# Patient Record
Sex: Female | Born: 1945
Health system: Southern US, Community
[De-identification: ages and names within clinical notes are randomized; demographics above are authoritative.]

## PROBLEM LIST (undated history)

## (undated) DIAGNOSIS — E785 Hyperlipidemia, unspecified: Secondary | ICD-10-CM

## (undated) DIAGNOSIS — F32A Depression, unspecified: Secondary | ICD-10-CM

## (undated) DIAGNOSIS — M85851 Other specified disorders of bone density and structure, right thigh: Secondary | ICD-10-CM

## (undated) DIAGNOSIS — M85852 Other specified disorders of bone density and structure, left thigh: Secondary | ICD-10-CM

## (undated) DIAGNOSIS — E210 Primary hyperparathyroidism: Secondary | ICD-10-CM

## (undated) HISTORY — DX: Primary hyperparathyroidism: E21.0

## (undated) HISTORY — PX: APPENDECTOMY: SHX54

## (undated) HISTORY — DX: Hyperlipidemia, unspecified: E78.5

## (undated) HISTORY — PX: COLONOSCOPY: SHX174

## (undated) HISTORY — PX: ABDOMINAL HYSTERECTOMY: SHX81

## (undated) HISTORY — DX: Other specified disorders of bone density and structure, right thigh: M85.851

## (undated) HISTORY — DX: Depression, unspecified: F32.A

## (undated) HISTORY — DX: Other specified disorders of bone density and structure, left thigh: M85.852

---

## 2006-05-13 ENCOUNTER — Encounter: Payer: Self-pay | Admitting: Internal Medicine

## 2007-04-23 ENCOUNTER — Encounter: Payer: Self-pay | Admitting: Internal Medicine

## 2007-04-23 LAB — CONVERTED CEMR LAB: HDL: 76 mg/dL

## 2007-12-21 ENCOUNTER — Encounter: Payer: Self-pay | Admitting: Internal Medicine

## 2007-12-21 LAB — CONVERTED CEMR LAB
HDL: 82 mg/dL
LDL Cholesterol: 116 mg/dL
Triglyceride fasting, serum: 79 mg/dL

## 2008-03-15 ENCOUNTER — Encounter: Payer: Self-pay | Admitting: Internal Medicine

## 2008-03-15 LAB — CONVERTED CEMR LAB
CO2: 28 meq/L
Calcium: 10.1 mg/dL
Creatinine, Ser: 0.75 mg/dL
Glucose, Bld: 94 mg/dL
HCT: 39.2 %
MCV: 91.3 fL
RDW: 13.1 %
Sodium: 144 meq/L

## 2008-07-27 ENCOUNTER — Encounter: Payer: Self-pay | Admitting: Internal Medicine

## 2008-07-27 LAB — HM COLONOSCOPY

## 2009-06-07 ENCOUNTER — Encounter: Payer: Self-pay | Admitting: Internal Medicine

## 2009-06-07 LAB — CONVERTED CEMR LAB
Basophils Relative: 0.4 %
CO2: 29 meq/L
Calcium: 10.5 mg/dL
Chloride: 106 meq/L
Cholesterol: 223 mg/dL
Eosinophils Relative: 1.6 %
HDL: 82 mg/dL
LDL Cholesterol: 123 mg/dL
Lymphocytes, automated: 35.4 %
Neutrophils Relative %: 53.7 %
Potassium: 4.2 meq/L
RDW: 13.8 %
TSH: 2.07 microintl units/mL
WBC: 5 10*3/uL

## 2010-07-25 ENCOUNTER — Ambulatory Visit: Payer: Self-pay | Admitting: Internal Medicine

## 2010-07-25 ENCOUNTER — Encounter: Payer: Self-pay | Admitting: Internal Medicine

## 2010-07-25 ENCOUNTER — Telehealth: Payer: Self-pay | Admitting: Internal Medicine

## 2010-07-25 DIAGNOSIS — E785 Hyperlipidemia, unspecified: Secondary | ICD-10-CM

## 2010-07-25 DIAGNOSIS — M85851 Other specified disorders of bone density and structure, right thigh: Secondary | ICD-10-CM

## 2010-07-25 DIAGNOSIS — F329 Major depressive disorder, single episode, unspecified: Secondary | ICD-10-CM | POA: Insufficient documentation

## 2010-07-25 DIAGNOSIS — E782 Mixed hyperlipidemia: Secondary | ICD-10-CM | POA: Insufficient documentation

## 2010-07-25 DIAGNOSIS — F339 Major depressive disorder, recurrent, unspecified: Secondary | ICD-10-CM | POA: Insufficient documentation

## 2010-07-25 DIAGNOSIS — M85852 Other specified disorders of bone density and structure, left thigh: Secondary | ICD-10-CM

## 2010-07-25 DIAGNOSIS — M810 Age-related osteoporosis without current pathological fracture: Secondary | ICD-10-CM | POA: Insufficient documentation

## 2010-07-27 LAB — CONVERTED CEMR LAB
AST: 25 units/L (ref 0–37)
Alkaline Phosphatase: 61 units/L (ref 39–117)
BUN: 13 mg/dL (ref 6–23)
Basophils Absolute: 0 10*3/uL (ref 0.0–0.1)
Bilirubin Urine: NEGATIVE
Calcium: 10.7 mg/dL — ABNORMAL HIGH (ref 8.4–10.5)
Cholesterol: 233 mg/dL — ABNORMAL HIGH (ref 0–200)
Direct LDL: 142.8 mg/dL
GFR calc non Af Amer: 74.93 mL/min (ref >60)
Lymphocytes Relative: 33.1 % (ref 12.0–46.0)
Monocytes Relative: 7.6 % (ref 3.0–12.0)
Neutrophils Relative %: 57.6 % (ref 43.0–77.0)
Nitrite: NEGATIVE
Platelets: 233 10*3/uL (ref 150.0–400.0)
Potassium: 4.7 meq/L (ref 3.5–5.1)
RDW: 13.2 % (ref 11.5–14.6)
Sodium: 142 meq/L (ref 135–145)
Total Bilirubin: 0.5 mg/dL (ref 0.3–1.2)
Total CHOL/HDL Ratio: 3
Urobilinogen, UA: 0.2 (ref 0.0–1.0)
VLDL: 25 mg/dL (ref 0.0–40.0)
pH: 5.5 (ref 5.0–8.0)

## 2010-08-08 ENCOUNTER — Encounter: Payer: Self-pay | Admitting: Internal Medicine

## 2010-08-08 LAB — CONVERTED CEMR LAB
HCT: 40.3 %
Hgb A1c MFr Bld: 6.7 %
MCV: 93.3 fL
RDW: 13.9 %
TSH: 0.71 microintl units/mL

## 2010-08-08 LAB — HM MAMMOGRAPHY

## 2010-08-23 ENCOUNTER — Ambulatory Visit: Payer: Self-pay | Admitting: Internal Medicine

## 2010-08-23 ENCOUNTER — Telehealth: Payer: Self-pay | Admitting: Internal Medicine

## 2010-08-27 DIAGNOSIS — E210 Primary hyperparathyroidism: Secondary | ICD-10-CM

## 2010-08-27 LAB — CONVERTED CEMR LAB
Calcium, Total (PTH): 10.8 mg/dL — ABNORMAL HIGH (ref 8.4–10.5)
PTH: 119.3 pg/mL — ABNORMAL HIGH (ref 14.0–72.0)

## 2010-08-28 ENCOUNTER — Encounter: Payer: Self-pay | Admitting: Internal Medicine

## 2010-08-30 ENCOUNTER — Ambulatory Visit: Payer: Self-pay | Admitting: Endocrinology

## 2010-09-20 ENCOUNTER — Ambulatory Visit: Payer: Self-pay | Admitting: Internal Medicine

## 2011-01-01 NOTE — Progress Notes (Signed)
Summary: fax #  Phone Note Call from Patient Call back at Home Phone [phone]   Caller: Patient Summary of Call: Pt called to inform VAL of names and phone numbers of previous MDs in [place]. Dr Joelene Millin [phone] fax [phone]. Dr Ala Dach [phone] Initial call taken by: Margaret Pyle, CMA,  July 25, 2010 1:34 PM  Follow-up for Phone Call        Faxed medical release forms to both md's Follow-up by: Orlan Leavens RMA,  July 25, 2010 4:04 PM

## 2011-01-01 NOTE — Assessment & Plan Note (Signed)
Summary: 4 - 6 WK FU---STC   Vital Signs:  Patient profile:   65 year old female Height:      67 inches (170.18 cm) Weight:      126.0 pounds (57.27 kg) O2 Sat:      97 % on Room air Temp:     97.6 degrees F (36.44 degrees C) oral Pulse rate:   65 / minute BP sitting:   102 / 62  (left arm) Cuff size:   regular  Vitals Entered By: Orlan Leavens RMA (September 20, 2010 10:42 AM)  O2 Flow:  Room air CC: 6 week follow-up Is Patient Diabetic? No Pain Assessment Patient in pain? no      Comments Req form to be fill-out & fax to her work/ decline flu shot   Primary Care Provider:  Newt Lukes MD  CC:  6 week follow-up.  History of Present Illness:  here for followup  depression - hx same - reports compliance with ongoing medical treatment and no changes in medication dose or frequency. denies adverse side effects related to current therapy but increasing symptoms of fatigue, sadness and worthlessness changed zoloft to cymbalta tx 08/2010. on SSRI tx since 2004, - no energy since move to [place] 01/2010 and retirement - denies SI/HI - improved symptoms with cymbalta - feels much better  hypercalcemia - mild, noted last CPX labs - denies excess Ca intake - supplements or dietary - no muscle aches, abd symptoms, constipation or kidney stones - confirmed mild primary hyperparathyroidism 08/2010 and saw endo - no plans for surg tx at this time  dyslipidemia - reports compliance with ongoing medical treatment and no changes in medication dose or frequency. denies adverse side effects related to current therapy. no myalgias or GI problems - on statin tx >3 years  osteopenia - dexa 2010 - started on tx - several tried and dc's due oSE - on boniva until 03/2010 when stopped by dentist due to jaw bone problem with dental work - no bone pain or hx fx    Clinical Review Panels:  Lipid Management   Cholesterol:  233 (07/25/2010)   LDL (bad choesterol):  123 (06/07/2009)   HDL (good  cholesterol):  81.19 (07/25/2010)   Triglycerides:  92 (06/07/2009)   Current Medications (verified): 1)  Pravastatin Sodium 20 Mg Tabs (Pravastatin Sodium) .... Take 1 At Bedtime 2)  B50 .... Take 1 By Mouth Once Daily 3)  Biotin 10 Mg Tabs (Biotin) .... Take 1 By Mouth Once Daily 4)  Calcium-Vitamin D 1200mg  .... Take Once Daily 5)  Cymbalta 60 Mg Cpep (Duloxetine Hcl) .Marland Kitchen.. 1 By Mouth Once Daily  Allergies (verified): No Known Drug Allergies  Past History:  Past Medical History: Depression osteopenia dyslipidemia primary hyperparathyroidism, mild - dx 08/2010  MD roster: gyn endo - ellison  Review of Systems  The patient denies weight loss, chest pain, syncope, headaches, and abdominal pain.    Physical Exam  General:  fit, alert, well-developed, well-nourished, and cooperative to examination.    Lungs:  normal respiratory effort, no intercostal retractions or use of accessory muscles; normal breath sounds bilaterally - no crackles and no wheezes.    Heart:  normal rate, regular rhythm, no murmur, and no rub. BLE without edema. Psych:  Oriented X3, memory intact for recent and remote, normally interactive, good eye contact, not anxious appearing, not depressed appearing, and not agitated.      Impression & Recommendations:  Problem # 1:  DEPRESSION (ICD-311)  Her updated medication list for this problem includes:    Cymbalta 60 Mg Cpep (Duloxetine hcl) .Marland Kitchen... 1 by mouth once daily  long hx same - symptoms situational precipitated by move and retirement spring 2011 change zoloft to cymbalta 08/2010 - doing well - cont same discussed poss use of venlafax for cost savings but will remain on cymbalta for now since newly changed and doing well  Problem # 2:  DYSLIPIDEMIA (ICD-272.4)  Her updated medication list for this problem includes:    Pravastatin Sodium 20 Mg Tabs (Pravastatin sodium) .Marland Kitchen... Take 1 at bedtime on tx since 2008 mild inc total since 2010 on prava  per pt - but as HDL very good, i do not rec any change in dose or type of statin  Labs Reviewed: SGOT: 25 (07/25/2010)   SGPT: 13 (07/25/2010)   HDL:76.10 (07/25/2010), 82 (06/07/2009)  LDL:123 (06/07/2009), 116 (12/21/2007)  Chol:233 (07/25/2010), 223 (06/07/2009)  Trig:125.0 (07/25/2010), 92 (06/07/2009)  Problem # 3:  PRIMARY HYPERPARATHYROIDISM (ICD-252.01) mild s/p endo eval 08/23/10 - monitor only, no surg or med change (OV and recs reviewed) check Ca 2x/yr and pth annually -   Complete Medication List: 1)  Pravastatin Sodium 20 Mg Tabs (Pravastatin sodium) .... Take 1 at bedtime 2)  B50  .... Take 1 by mouth once daily 3)  Biotin 10 Mg Tabs (Biotin) .... Take 1 by mouth once daily 4)  Calcium-vitamin D 1200mg   .... Take once daily 5)  Cymbalta 60 Mg Cpep (Duloxetine hcl) .Marland Kitchen.. 1 by mouth once daily  Patient Instructions: 1)  it was good to see you today. 2)  will send cymbalta and pravastatin to [facility] as discussed - also coupon for 30 free pills to pick up at your local pharmacy 3)  Please schedule a follow-up appointment in 3-6 months to monitor calcium level and cholesterol as well as depression meds; call sooner if problems.  Prescriptions: CYMBALTA 60 MG CPEP (DULOXETINE HCL) 1 by mouth once daily  #90 x 3   Entered and Authorized by:   Newt Lukes MD   Signed by:   Newt Lukes MD on 09/20/2010   Method used:   Faxed to ...       [facility] (mail-order)             , [place]         Ph: [phone]       Fax: [phone]   RxID:   [id number] PRAVASTATIN SODIUM 20 MG TABS (PRAVASTATIN SODIUM) take 1 at bedtime  #90 x 3   Entered and Authorized by:   Newt Lukes MD   Signed by:   Newt Lukes MD on 09/20/2010   Method used:   Faxed to ...       [facility] (mail-order)             , [place]         Ph: [phone]       Fax: [phone]   RxID:   [id number]    Orders Added: 1)  Est. Patient Level IV [75643]

## 2011-01-01 NOTE — Assessment & Plan Note (Signed)
Summary: NEW ENDO CON/ PARATHYROID / NWS   Vital Signs:  Patient profile:   65 year old female Height:      67 inches (170.18 cm) Weight:      123.25 pounds (56.02 kg) BMI:     19.37 O2 Sat:      97 % on Room air Temp:     97.4 degrees F (36.33 degrees C) oral Pulse rate:   64 / minute BP sitting:   102 / 58  (left arm) Cuff size:   regular  Vitals Entered By: Brenton Grills MA (August 30, 2010 2:15 PM)  O2 Flow:  Room air CC: New Endo/Parathyroid/Dr. Leschber/aj   Primary Provider:  Newt Lukes MD  CC:  New Endo/Parathyroid/Dr. Leschber/aj.  History of Present Illness: pt was incidentally noted on routine labs last month to have hypercalcemia.   she also has h/o "osteopenia."  she has few years of moderate depression, but no abd pain.  no assoc bony fracture or urolithiasis.   Current Medications (verified): 1)  Pravastatin Sodium 20 Mg Tabs (Pravastatin Sodium) .... Take 1 At Bedtime 2)  B50 .... Take 1 By Mouth Once Daily 3)  Biotin 10 Mg Tabs (Biotin) .... Take 1 By Mouth Once Daily 4)  Calcium-Vitamin D 1200mg  .... Take Once Daily 5)  Cymbalta 30 Mg Cpep (Duloxetine Hcl) .Marland Kitchen.. 1 By Mouth Once Daily X 2 Weeks, Then Increase To 60mg  Once Daily (New Prescription) 6)  Cymbalta 60 Mg Cpep (Duloxetine Hcl) .Marland Kitchen.. 1 By Mouth Once Daily  Allergies (verified): No Known Drug Allergies  Past History:  Past Medical History: Last updated: 08/23/2010 Depression osteopenia dyslipidemia  MD roster: gyn  Family History: Reviewed history from 07/25/2010 and no changes required. Family History of Colon CA 1st degree relative (brother dx age 50y) no CAD, CVA, or DM hx known no parathyroid or calcium probs.  Social History: Reviewed history from 07/25/2010 and no changes required. 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  she has had menopausal sxs since tah/bso at age 56.  Physical Exam  General:  normal appearance.   Head:  head: no deformity eyes: no periorbital swelling, no proptosis external nose and ears are normal mouth: no lesion seen Neck:  Supple without thyroid enlargement or tenderness.  Chest Wall:  no kyphosis Lungs:  Clear to auscultation bilaterally. Normal respiratory effort.  Heart:  Regular rate and rhythm without murmurs or gallops noted. Normal S1,S2.   Abdomen:  abdomen is soft, nontender.  no hepatosplenomegaly.   not distended.  no hernia  Msk:  muscle bulk and strength are grossly normal.  no obvious joint swelling.  gait is normal and steady  Extremities:  no deformity Neurologic:  cn 2-12 grossly intact.   readily moves all 4's.   sensation is intact to touch on all 4's Skin:  normal texture and temp.  no rash.  not diaphoretic  Cervical Nodes:  No significant adenopathy.  Psych:  Alert and cooperative; normal mood and affect; normal attention span and concentration.   Additional Exam:  Parathyroid Hormone  [H]  119.3 pg/mL  14.0-72.0 Calcium              [H]  10.8 mg/dL        Impression & Recommendations:  Problem # 1:  PRIMARY HYPERPARATHYROIDISM (ICD-252.01) mild  Problem # 2:  menopause this can contribute also to #3  Problem # 3:  OSTEOPENIA (ICD-733.90) uncertain if this is related to #1  Other Orders: Consultation Level IV (16109)  Patient Instructions: 1)  the only effective treatment for this condition is surgery.  2)  surgery is recommended in several situations, none of which appear to be present on your case. 3)  you should have a blood calcuim checked 2x a year, and a parathyroid level checked one a year.  dr Felicity Coyer or i would be happy  to do this.  also, you should consider having it checked if you get sick (and dehydrated).

## 2011-01-01 NOTE — Progress Notes (Signed)
Summary: [facility] Screening form/StayWell  [facility] Screening form/StayWell   Imported By: Sherian Rein 09/25/2010 09:47:08  _____________________________________________________________________  External Attachment:    Type:   Image     Comment:   External Document

## 2011-01-01 NOTE — Assessment & Plan Note (Signed)
Summary: 2-6 WK FU---STC   Vital Signs:  Patient profile:   65 year old female Height:      67 inches (170.18 cm) Weight:      123 pounds (55.91 kg) O2 Sat:      96 % on Room air Temp:     97.1 degrees F (36.17 degrees C) oral Pulse rate:   64 / minute BP sitting:   80 / 52  (left arm) Cuff size:   regular  Vitals Entered By: Orlan Leavens RMA (August 23, 2010 8:37 AM)  O2 Flow:  Room air CC: 6 week follow-up Is Patient Diabetic? No Pain Assessment Patient in pain? no        Primary Care Provider:  Newt Lukes MD  CC:  6 week follow-up.  History of Present Illness: here for followup  depression - hx same - reports compliance with ongoing medical treatment and no changes in medication dose or frequency. denies adverse side effects related to current therapy but increasing symptoms of fatigue, sadness and worthlessness. on SSRI tx since 2004, current dose since 2008 - no energy since move to [place] 01/2010 and retirement - denies SI/HI  hypercalcemia - mild, noted last CPX labs - denies excess Ca intake - supplements or dietary - no muscle aches, abd symptoms, constipation or kidney stones - no hx same known  dyslipidemia - reports compliance with ongoing medical treatment and no changes in medication dose or frequency. denies adverse side effects related to current therapy. no myalgias or GI problems - on statin tx >3 years  osteopenia - dexa 2010 - started on tx - several tried and dc's due oSE - on boniva until 03/2010 when stopped by dentist due to jaw bone problem with dental work - no bone pain or hx fx    Clinical Review Panels:  CBC   WBC:  6.2 (07/25/2010)   RBC:  4.88 (07/25/2010)   Hgb:  15.1 (07/25/2010)   Hct:  44.7 (07/25/2010)   Platelets:  233.0 (07/25/2010)   MCV  91.6 (07/25/2010)   MCHC  33.8 (07/25/2010)   RDW  13.2 (07/25/2010)   PMN:  57.6 (07/25/2010)   Lymphs:  33.1 (07/25/2010)   Monos:  7.6 (07/25/2010)   Eosinophils:  1.2  (07/25/2010)   Basophil:  0.5 (07/25/2010)  Complete Metabolic Panel   Glucose:  87 (07/25/2010)   Sodium:  142 (07/25/2010)   Potassium:  4.7 (07/25/2010)   Chloride:  105 (07/25/2010)   CO2:  30 (07/25/2010)   BUN:  13 (07/25/2010)   Creatinine:  0.8 (07/25/2010)   Albumin:  4.5 (07/25/2010)   Total Protein:  7.2 (07/25/2010)   Calcium:  10.7 (07/25/2010)   Total Bili:  0.5 (07/25/2010)   Alk Phos:  61 (07/25/2010)   SGPT (ALT):  13 (07/25/2010)   SGOT (AST):  25 (07/25/2010)   Current Medications (verified): 1)  Zoloft 100 Mg Tabs (Sertraline Hcl) .... Take 1 1/2 By Mouth Once Daily 2)  Pravastatin Sodium 20 Mg Tabs (Pravastatin Sodium) .... Take 1 At Bedtime 3)  B50 .... Take 1 By Mouth Once Daily 4)  Biotin 10 Mg Tabs (Biotin) .... Take 1 By Mouth Once Daily 5)  Calcium-Vitamin D 1200mg  .... Take Once Daily  Allergies (verified): No Known Drug Allergies  Past History:  Past Medical History: Depression osteopenia dyslipidemia  MD roster: gyn  Review of Systems  The patient denies fever, weight loss, chest pain, syncope, and headaches.    Physical Exam  General:  fit, alert, well-developed, well-nourished, and cooperative to examination.    Lungs:  normal respiratory effort, no intercostal retractions or use of accessory muscles; normal breath sounds bilaterally - no crackles and no wheezes.    Heart:  normal rate, regular rhythm, no murmur, and no rub. BLE without edema. normal DP pulses and normal cap refill in all 4 extremities    Psych:  Oriented X3, memory intact for recent and remote, normally interactive,fair eye contact, not anxious appearing, mod depressed appearing with mild dysphoria, not agitated.      Impression & Recommendations:  Problem # 1:  DEPRESSION (ICD-311)  Her updated medication list for this problem includes:    Zoloft 100 Mg Tabs (Sertraline hcl) .Marland Kitchen... Take 1 once daily x 3 days then 1/2 by mouth once daily x 3 days, then stop     Cymbalta 30 Mg Cpep (Duloxetine hcl) .Marland Kitchen... 1 by mouth once daily x 2 weeks, then increase to 60mg  once daily (new prescription)    Cymbalta 60 Mg Cpep (Duloxetine hcl) .Marland Kitchen... 1 by mouth once daily  Orders: Prescription Created Electronically [id number])  long hx same - symptoms worse in past 6 mos - ?situational precipitated by move and retirement? prev tried alt depression med --- caused dizziness - sent for prior records to review but not here given cont "down feelings" , change now to alt med tx  risk/benefit of med tx reviewed and pt agrees to same close fu 4-6 weeks to review meds and symptoms   Problem # 2:  HYPERCALCEMIA (ICD-275.42) mild, incidental on CPX labs - check iPTH now - tx if abn - Orders: T-Parathyroid Hormone, Intact w/ Calcium ([id number])  Complete Medication List: 1)  Zoloft 100 Mg Tabs (Sertraline hcl) .... Take 1 once daily x 3 days then 1/2 by mouth once daily x 3 days, then stop 2)  Pravastatin Sodium 20 Mg Tabs (Pravastatin sodium) .... Take 1 at bedtime 3)  B50  .... Take 1 by mouth once daily 4)  Biotin 10 Mg Tabs (Biotin) .... Take 1 by mouth once daily 5)  Calcium-vitamin D 1200mg   .... Take once daily 6)  Cymbalta 30 Mg Cpep (Duloxetine hcl) .Marland Kitchen.. 1 by mouth once daily x 2 weeks, then increase to 60mg  once daily (new prescription) 7)  Cymbalta 60 Mg Cpep (Duloxetine hcl) .Marland Kitchen.. 1 by mouth once daily  Other Orders: Dermatology Referral (Derma)  Patient Instructions: 1)  it was good to see you today. 2)  test(s) ordered today - your results will be posted on the phone tree for review in 48-72 hours from the time of test completion; call [phone] and enter your 9 digit MRN (listed above on this page, just below your name); if any changes need to be made or there are abnormal results, you will be contacted directly. 3)  wean of sertraline and start cymbalta as discussed (see below) - your 60mg  cymbalta prescription has been electronically submitted to your  pharmacy. Please take as directed. Contact our office if you believe you're having problems with the medication(s).  4)  we'll make referral to dermatology.  Our office will contact you regarding this appointment once made.  5)  Please schedule a follow-up appointment in 4-6 weeks, sooner if problems.  Prescriptions: CYMBALTA 60 MG CPEP (DULOXETINE HCL) 1 by mouth once daily  #30 x 3   Entered and Authorized by:   Newt Lukes MD   Signed by:   Newt Lukes MD on 08/23/2010  Method used:   Electronically to        [facility]  [address] [phone]* (retail)       [address] [place], [place]  [address]       Ph: [phone] or [phone]       Fax: [phone]   RxID:   [id number]

## 2011-01-01 NOTE — Progress Notes (Signed)
Summary: Dosage?  Phone Note Call from Patient Call back at [facility] Phone [phone]   Caller: Patient Summary of Call: Pt called stating that she is unclear of how to wean off of Zoloft and start Cymbalta (dosage). Left message on machine for pt to return my call  Initial call taken by: Margaret Pyle, CMA,  August 23, 2010 2:41 PM  Follow-up for Phone Call        left message on machine for pt to return my call  left message on machine for pt to return my call  left message on machine for pt to return my call if she still needed help with], medicaltions. Closing phone note until further contact from pt. Follow-up by: Margaret Pyle, CMA,  August 28, 2010 8:12 AM

## 2011-01-01 NOTE — Assessment & Plan Note (Signed)
Summary: NEW PT/ [employer] #   Vital Signs:  Patient profile:   65 year old female Height:      67 inches (170.18 cm) Weight:      123.8 pounds (56.27 kg) BMI:     19.46 O2 Sat:      97 % on Room air Temp:     98.5 degrees F (36.94 degrees C) oral Pulse rate:   67 / minute BP sitting:   100 / 72  (left arm) Cuff size:   regular  Vitals Entered By: Orlan Leavens RMA (July 25, 2010 8:16 AM)  O2 Flow:  Room air CC: New patient Is Patient Diabetic? No Pain Assessment Patient in pain? no        Primary Care [place]:  Newt Lukes MD  CC:  New patient.  History of Present Illness: new pt to me and our practice, here to est care no local PCP  patient is here today for annual physical. Patient feels well and has no complaints.   also review chronic issues: 1) dyslipidemia - reports compliance with ongoing medical treatment and no changes in medication dose or frequency. denies adverse side effects related to current therapy. no myalgias or GI problems - on statin tx since  2) depression hx - reports compliance with ongoing medical treatment and no changes in medication dose or frequency. denies adverse side effects related to current therapy. on SSRI tx since 2004, current dose since 2008 - no energy since move to [place] 6 mo ago and retirement  3) osteopenia - dexa 2010 - started on tx - several tried and dc's due oSE - on boniva until 03/2010 when stopped by dentitis due to jaw bone problem with dental work - no bone pain or hx fx    Contraindications/Deferment of Procedures/Staging:    Test/Procedure: FLU VAX    Reason for deferment: patient declined   Preventive Screening-Counseling & Management  Alcohol-Tobacco     Alcohol drinks/day: <1     Alcohol Counseling: not indicated; use of alcohol is not excessive or problematic     Smoking Status: quit     Tobacco Counseling: not to resume use of tobacco products  Caffeine-Diet-Exercise     Does Patient Exercise:  yes     Depression Counseling: further diagnostic testing and/or other treatment is indicated  Safety-Violence-Falls     Seat Belt Counseling: not indicated; patient wears seat belts     Helmet Counseling: not indicated; patient wears helmet when riding bicycle/motocycle     Firearms in the Home: no firearms in the home     Firearm Counseling: not applicable     Violence Counseling: not indicated; no violence risk noted     Fall Risk Counseling: not indicated; no significant falls noted  Clinical Review Panels:  Prevention   Last Mammogram:  No specific mammographic evidence of malignancy.   (12/02/2008)   Last Colonoscopy:  Pt states was done in [place]. Results was normal, but since brother had colon cancer recommend repeat in 5 years (12/03/2007)   Current Medications (verified): 1)  Zoloft 100 Mg Tabs (Sertraline Hcl) .... Take 1 1/2 By Mouth Once Daily 2)  Pravastatin Sodium 20 Mg Tabs (Pravastatin Sodium) .... Take 1 At Bedtime 3)  B50 .... Take 1 By Mouth Once Daily 4)  Biotin 10 Mg Tabs (Biotin) .... Take 1 By Mouth Once Daily 5)  Calcium-Vitamin D 1200mg  .... 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 I have reviewed all other systems and they were negative.   Physical Exam  General:  fit, alert, well-developed, well-nourished, and cooperative to examination.    Head:  Normocephalic and atraumatic without obvious  abnormalities. No apparent alopecia or balding. Eyes:  vision grossly intact; pupils equal, round and reactive to light.  conjunctiva and lids normal.    Ears:  normal pinnae bilaterally, without erythema, swelling, or tenderness to palpation. scarring R TM, mild clear effusion B, no cerumen impaction. Hearing grossly normal bilaterally  Mouth:  teeth and gums in good repair - area of bone/gum loss front left medial to canine) ; mucous membranes moist, without lesions or ulcers. oropharynx clear without exudate, no erythema.  Neck:  supple, full ROM, no masses, no thyromegaly; no thyroid nodules or tenderness. no JVD or carotid bruits.   Lungs:  normal respiratory effort, no intercostal retractions or use of accessory muscles; normal breath sounds bilaterally - no crackles and no wheezes.    Heart:  normal rate, regular rhythm, no murmur, and no rub. BLE without edema. normal DP pulses and normal cap refill in all 4 extremities    Abdomen:  soft, non-tender, normal bowel sounds, no distention; no masses and no appreciable hepatomegaly or splenomegaly.   Genitalia:  defer to gyn Msk:  No deformity or scoliosis noted of thoracic or lumbar spine.   Neurologic:  alert & oriented X3 and cranial nerves II-XII symetrically intact.  strength normal in all extremities, sensation intact to light touch, and gait normal. speech fluent without dysarthria or aphasia; follows commands with good comprehension.  Skin:  no rashes, vesicles, ulcers, or erythema. No nodules or irregularity to palpation.  Psych:  Oriented X3, memory intact for recent and remote, normally interactive, good eye contact, not anxious appearing, not depressed appearing but mild dysphoria, not agitated.      Impression & Recommendations:  Problem # 1:  PREVENTIVE HEALTH CARE (ICD-V70.0) Patient has been counseled on age-appropriate routine health concerns for screening and prevention. These are reviewed and up-to-date. Immunizations are  up-to-date or declined. Labs ordered and ECG reviewed -WNL. refer to gyn and for mammo, dexa 2010 - ostepenia - see below  Orders: TLB-CBC Platelet - w/Differential (85025-CBCD) TLB-Hepatic/Liver Function Pnl (80076-HEPATIC) TLB-Lipid Panel (80061-LIPID) TLB-TSH (Thyroid Stimulating Hormone) (84443-TSH) EKG w/ Interpretation (93000) TLB-BMP (Basic Metabolic Panel-BMET) (80048-METABOL) TLB-Udip w/ Micro (81001-URINE) Gynecologic Referral (Gyn) Misc. Referral (Misc. Ref)  Problem # 2:  OSTEOPENIA (ICD-733.90) prev on tx - fosamax, boniva, evista and another.. (?name) following dexa 2010 results  (not available at this time but wil send for records from [place]) dentist stopped tx 03/2010 due to jaw bone loss (upper left mouth) takes vit d + ca supp - remain off bisphos - recheck dexa 2012 -- ?consider prolia if cont progression of skel bone loss - d/w pt today  Problem # 3:  DEPRESSION (ICD-311) long hx same - symptoms worse in past 6 mos - ?situation precipitated by move  and retirement? prev tried alt depression med --- caused dizziness - send for prior records to review f/u 2-6 weeks if cont "down feelings" to consider alt med tx after review of prior trials - pt agrees Her updated medication list for this problem includes:    Zoloft 100 Mg Tabs (Sertraline hcl) .Marland Kitchen... Take 1 1/2 by mouth once daily  Problem # 4:  DYSLIPIDEMIA (ICD-272.4) on tx since 2008 - checking labs with cpx today -  Her updated medication list for this problem includes:    Pravastatin Sodium 20 Mg Tabs (Pravastatin sodium) .Marland Kitchen... Take 1 at bedtime  Complete Medication List: 1)  Zoloft 100 Mg Tabs (Sertraline hcl) .... Take 1 1/2 by mouth once daily 2)  Pravastatin Sodium 20 Mg Tabs (Pravastatin sodium) .... Take 1 at bedtime 3)  B50  .... Take 1 by mouth once daily 4)  Biotin 10 Mg Tabs (Biotin) .... Take 1 by mouth once daily 5)  Calcium-vitamin D 1200mg   .... Take once daily  Other Orders: Tdap => 60yrs IM  (16109) Admin 1st Vaccine (60454)  Patient Instructions: 1)  it was good to see you today. 2)  medical history and medications reviewed - no changes today 3)  exam and EKG look great 4)  test(s) ordered today - your results will be posted on the phone tree for review in 48-72 hours from the time of test completion; call [phone] and enter your 9 digit MRN (listed above on this page, just below your name); if any changes need to be made or there are abnormal results, you will be contacted directly.  5)  will send for records from your prior [facility] to review 6)  we'll make referral to gynecology and for screening mammography. Our office will contact you regarding these appointments once made.  7)  Please schedule a follow-up appointment in 2-6 weeks to review depression symptoms and medications, sooner if problems.    Colonoscopy  Procedure date:  12/03/2007  Findings:      Pt states was done in [place]. Results was normal, but since brother had colon cancer recommend repeat in 5 years  Mammogram  Procedure date:  12/02/2008  Findings:      No specific mammographic evidence of malignancy.      Immunizations Administered:  Tetanus Vaccine:    Vaccine Type: Tdap    Site: right deltoid    Mfr: GlaxoSmithKline    Dose: 0.5 ml    Route: IM    Given by: Orlan Leavens RMA    Exp. Date: 05/31/2012    Lot #: [id number]    VIS given: 07/25/10

## 2011-01-01 NOTE — Procedures (Signed)
Summary: Colonoscopy/Stephens Odis Luster MD  Colonoscopy/Stephens Odis Luster MD   Imported By: Sherian Rein 08/01/2010 08:28:26  _____________________________________________________________________  External Attachment:    Type:   Image     Comment:   External Document

## 2011-01-11 ENCOUNTER — Encounter: Payer: Self-pay | Admitting: Internal Medicine

## 2011-01-11 ENCOUNTER — Ambulatory Visit (INDEPENDENT_AMBULATORY_CARE_PROVIDER_SITE_OTHER): Payer: BC Managed Care – PPO | Admitting: Internal Medicine

## 2011-01-11 DIAGNOSIS — H6500 Acute serous otitis media, unspecified ear: Secondary | ICD-10-CM

## 2011-01-17 NOTE — Assessment & Plan Note (Signed)
Summary: SORE THROAT--EARACHE  STC   Vital Signs:  Patient profile:   65 year old female Height:      67 inches (170.18 cm) O2 Sat:      96 % on Room air Temp:     98.5 degrees F (36.94 degrees C) oral Pulse rate:   70 / minute BP sitting:   110 / 64  (left arm) Cuff size:   regular  Vitals Entered By: Orlan Leavens RMA (January 11, 2011 11:41 AM)  O2 Flow:  Room air CC: sore throat & earache, URI symptoms Is Patient Diabetic? No Pain Assessment Patient in pain? yes     Location: ears   Primary Care Provider:  Newt Lukes MD  CC:  sore throat & earache and URI symptoms.  History of Present Illness:  URI Symptoms      This is a 65 year old woman who presents with URI symptoms.  The symptoms began 6 days ago.  The severity is described as moderate.  The patient reports sore throat, dry cough, earache, and sick contacts, but denies nasal congestion, clear nasal discharge, and purulent nasal discharge.  Associated symptoms include low-grade fever (<100.5 degrees), use of an antipyretic, and response to antipyretic.  The patient denies stiff neck, dyspnea, wheezing, rash, vomiting, and diarrhea.  The patient also reports headache, muscle aches, and severe fatigue.  The patient denies sneezing, seasonal symptoms, and response to antihistamine.  The patient denies the following risk factors for Strep sinusitis: double sickening, tender adenopathy, and absence of cough.    Current Medications (verified): 1)  Pravastatin Sodium 20 Mg Tabs (Pravastatin Sodium) .... Take 1 At Bedtime 2)  B50 .... Take 1 By Mouth Once Daily 3)  Biotin 10 Mg Tabs (Biotin) .... Take 1 By Mouth Once Daily 4)  Calcium-Vitamin D 1200mg  .... Take Once Daily 5)  Cymbalta 60 Mg Cpep (Duloxetine Hcl) .Marland Kitchen.. 1 By Mouth Once Daily  Allergies (verified): No Known Drug Allergies  Past History:  Past Medical History: Depression osteopenia   dyslipidemia  primary hyperparathyroidism, mild - dx 08/2010  MD  roster: gyn endo - [facility]  Review of Systems  The patient denies weight loss, hoarseness, syncope, prolonged cough, and hemoptysis.    Physical Exam  General:  alert, well-developed, well-nourished, and cooperative to examination.   audible nasal congestion Head:  Normocephalic and atraumatic without obvious abnormalities. No apparent alopecia or balding. Eyes:  vision grossly intact; pupils equal, round and reactive to light.  conjunctiva and lids normal.    Ears:  normal pinnae bilaterally, without erythema, swelling, or tenderness to palpation. scarring R TM, mild clear effusion L>R, no cerumen impaction. Hearing grossly normal bilaterally  Nose:  External nasal examination shows no deformity or inflammation. Nasal mucosa are pink and moist without lesions or exudates. Mouth:  teeth and gums in good repair; mucous membranes moist, without lesions or ulcers. oropharynx clear without exudate, no erythema.  Lungs:  normal respiratory effort, no intercostal retractions or use of accessory muscles; normal breath sounds bilaterally - no crackles and no wheezes.    Heart:  normal rate, regular rhythm, no murmur, and no rub. BLE without edema.   Impression & Recommendations:  Problem # 1:  OTITIS MEDIA, SEROUS, ACUTE (ICD-381.01)  tx pred pak and abx - erx done cont otc symptoms relief as ongoing for cough/congestion  Orders: Prescription Created Electronically [phone])  Complete Medication List: 1)  Pravastatin Sodium 20 Mg Tabs (Pravastatin sodium) .... Take 1 at  bedtime 2)  B50  .... Take 1 by mouth once daily 3)  Biotin 10 Mg Tabs (Biotin) .... Take 1 by mouth once daily 4)  Calcium-vitamin D 1200mg   .... Take once daily 5)  Cymbalta 60 Mg Cpep (Duloxetine hcl) .Marland Kitchen.. 1 by mouth once daily 6)  Biaxin 500 Mg Tabs (Clarithromycin) .Marland Kitchen.. 1 by mouth two times a day x 7 days 7)  Prednisone (pak) 10 Mg Tabs (Prednisone) .... As directed x 6 days  Patient Instructions: 1)  it was good to  see you today. 2)  Biaxin and Pred pak for sinus/ear symptoms as discussed - your prescriptions have been electronically submitted to your pharmacy. Please take as directed. Contact our office if you believe you're having problems with the medication(s).  3)  Get plenty of rest, drink lots of clear liquids, and use Tylenol or Ibuprofen for fever and comfort; also use other "cold" formulation meds for decongestion, cough etc.  Return in 7-10 days if you're not better:sooner if you're feeling worse. Prescriptions: PREDNISONE (PAK) 10 MG TABS (PREDNISONE) as directed x 6 days  #1 pak x 0   Entered and Authorized by:   Newt Lukes MD   Signed by:   Newt Lukes MD on 01/11/2011   Method used:   Electronically to        [facility]  [address] [phone]* (retail)       [address] [place], [place]  [address]       Ph: [phone] or [phone]       Fax: [phone]   RxID:   [id number] BIAXIN 500 MG TABS (CLARITHROMYCIN) 1 by mouth two times a day x 7 days  #14 x 0   Entered and Authorized by:   Newt Lukes MD   Signed by:   Newt Lukes MD on 01/11/2011   Method used:   Electronically to        [facility]  [address] [phone]* (retail)       [address] [place], [place]  [address]       Ph: [phone] or [phone]       Fax: [phone]   RxID:   [id number]    Orders Added: 1)  Est. Patient Level IV [23762] 2)  Prescription Created Electronically [phone]

## 2011-05-10 ENCOUNTER — Telehealth: Payer: Self-pay

## 2011-05-10 DIAGNOSIS — E210 Primary hyperparathyroidism: Secondary | ICD-10-CM

## 2011-05-10 NOTE — Telephone Encounter (Signed)
Pt called to have labs scheduled. Per OV with SAE 08/30/2010 pt is to have blood calcium checked every 6 months and parathyroid levels every year. Labs scheduled, pt advised.

## 2011-05-13 ENCOUNTER — Other Ambulatory Visit: Payer: BC Managed Care – PPO

## 2011-05-16 ENCOUNTER — Other Ambulatory Visit (INDEPENDENT_AMBULATORY_CARE_PROVIDER_SITE_OTHER): Payer: BC Managed Care – PPO

## 2011-05-16 DIAGNOSIS — E210 Primary hyperparathyroidism: Secondary | ICD-10-CM

## 2011-05-17 LAB — PTH, INTACT AND CALCIUM
Calcium, Total (PTH): 11.5 mg/dL — ABNORMAL HIGH (ref 8.4–10.5)
PTH: 54.9 pg/mL (ref 14.0–72.0)

## 2011-06-01 ENCOUNTER — Encounter: Payer: Self-pay | Admitting: Internal Medicine

## 2011-09-02 ENCOUNTER — Other Ambulatory Visit: Payer: Self-pay | Admitting: Internal Medicine

## 2011-10-16 ENCOUNTER — Telehealth: Payer: Self-pay | Admitting: *Deleted

## 2011-10-16 DIAGNOSIS — E210 Primary hyperparathyroidism: Secondary | ICD-10-CM

## 2011-10-16 DIAGNOSIS — Z0000 Encounter for general adult medical examination without abnormal findings: Secondary | ICD-10-CM

## 2011-10-16 NOTE — Telephone Encounter (Signed)
CPX labs and parathyroid level + calcium entered into computer as future order labs -thanks

## 2011-10-16 NOTE — Telephone Encounter (Signed)
Pt states made 6 months f/u appt. Requesting labs done prior to appt...10/16/11@2 :20pm/LMB

## 2011-10-17 NOTE — Telephone Encounter (Signed)
Called pt was told by husband pt wasn't available left msg labs has been entered & she is able to come on at her convience...10/17/11@9 :14am/LMB

## 2011-10-18 ENCOUNTER — Encounter: Payer: Self-pay | Admitting: Internal Medicine

## 2011-10-18 ENCOUNTER — Ambulatory Visit (INDEPENDENT_AMBULATORY_CARE_PROVIDER_SITE_OTHER): Payer: BC Managed Care – PPO | Admitting: *Deleted

## 2011-10-18 ENCOUNTER — Other Ambulatory Visit (INDEPENDENT_AMBULATORY_CARE_PROVIDER_SITE_OTHER): Payer: BC Managed Care – PPO

## 2011-10-18 DIAGNOSIS — Z0000 Encounter for general adult medical examination without abnormal findings: Secondary | ICD-10-CM

## 2011-10-18 DIAGNOSIS — Z23 Encounter for immunization: Secondary | ICD-10-CM

## 2011-10-18 DIAGNOSIS — E210 Primary hyperparathyroidism: Secondary | ICD-10-CM

## 2011-10-18 LAB — BASIC METABOLIC PANEL
BUN: 19 mg/dL (ref 6–23)
Calcium: 10.4 mg/dL (ref 8.4–10.5)
Creatinine, Ser: 0.9 mg/dL (ref 0.4–1.2)
GFR: 70.64 mL/min (ref >60.00)
Glucose, Bld: 82 mg/dL (ref 70–99)
Potassium: 4.5 mEq/L (ref 3.5–5.1)

## 2011-10-18 LAB — URINALYSIS
Bilirubin Urine: NEGATIVE
Leukocytes, UA: NEGATIVE
Nitrite: NEGATIVE
Total Protein, Urine: NEGATIVE
pH: 6 (ref 5.0–8.0)

## 2011-10-18 LAB — CBC WITH DIFFERENTIAL/PLATELET
Basophils Absolute: 0 10*3/uL (ref 0.0–0.1)
Eosinophils Relative: 1 % (ref 0.0–5.0)
Lymphocytes Relative: 29.6 % (ref 12.0–46.0)
Monocytes Relative: 6.4 % (ref 3.0–12.0)
Neutrophils Relative %: 62.5 % (ref 43.0–77.0)
Platelets: 235 10*3/uL (ref 150.0–400.0)
RDW: 12.5 % (ref 11.5–14.6)
WBC: 5.3 10*3/uL (ref 4.5–10.5)

## 2011-10-18 LAB — LIPID PANEL
Cholesterol: 191 mg/dL (ref 0–200)
VLDL: 10.6 mg/dL (ref 0.0–40.0)

## 2011-10-18 LAB — HEPATIC FUNCTION PANEL
ALT: 11 U/L (ref 0–35)
AST: 25 U/L (ref 0–37)
Bilirubin, Direct: 0 mg/dL (ref 0.0–0.3)
Total Protein: 7.4 g/dL (ref 6.0–8.3)

## 2011-10-18 LAB — TSH: TSH: 1.14 u[IU]/mL (ref 0.35–5.50)

## 2011-10-21 ENCOUNTER — Encounter: Payer: Self-pay | Admitting: Internal Medicine

## 2011-10-21 ENCOUNTER — Ambulatory Visit (INDEPENDENT_AMBULATORY_CARE_PROVIDER_SITE_OTHER): Payer: BC Managed Care – PPO | Admitting: Internal Medicine

## 2011-10-21 VITALS — BP 110/62 | HR 83 | Temp 98.8°F | Wt 126.4 lb

## 2011-10-21 DIAGNOSIS — F329 Major depressive disorder, single episode, unspecified: Secondary | ICD-10-CM

## 2011-10-21 DIAGNOSIS — E210 Primary hyperparathyroidism: Secondary | ICD-10-CM

## 2011-10-21 DIAGNOSIS — Z0000 Encounter for general adult medical examination without abnormal findings: Secondary | ICD-10-CM

## 2011-10-21 DIAGNOSIS — E785 Hyperlipidemia, unspecified: Secondary | ICD-10-CM

## 2011-10-21 LAB — PTH, INTACT AND CALCIUM: Calcium, Total (PTH): 10.7 mg/dL — ABNORMAL HIGH (ref 8.4–10.5)

## 2011-10-21 NOTE — Assessment & Plan Note (Signed)
hx same - changed zoloft to cymbalta tx 08/2010. on SSRI tx since 2004  Large component of SAD (winter, dark cold months)  improved overall on cymbalta -declines need for counseling - suggested light box therapy Pt to call if symptoms worse or unimproved

## 2011-10-21 NOTE — Patient Instructions (Signed)
It was good to see you today. We have reviewed your prior records including labs and tests today Health Maintenance reviewed - will plan DEXA 2013, otherwise up to date! Medications reviewed, no changes at this time. Please schedule followup in 6 months for calcium and PTH lab check only, 12 months for office visit; call sooner if problems.

## 2011-10-21 NOTE — Assessment & Plan Note (Signed)
On prava since 2008 -  The current medical regimen is effective;  continue present plan and medications.  

## 2011-10-21 NOTE — Progress Notes (Signed)
Subjective:    Patient ID: Courtney Hood, female    DOB: Dec 30, 1945, 65 y.o.   MRN: [id number]  HPI  patient is here today for annual physical. Patient feels well and has no complaints.  Also reviewed chronic medical issues: depression - hx same - reports compliance with ongoing medical treatment and no changes in medication dose or frequency. denies adverse side effects related to current therapy - changed zoloft to cymbalta 08/2010. on SSRI tx since 2004, seasonally worse (winter) denies SI/HI - declines need for counseling  hypercalcemia - mild, noted 2010 CPX labs - denies excess Ca intake - supplements or dietary - no muscle aches, constipation or kidney stones - confirmed mild primary hyperparathyroidism 08/2010 and saw endo - no plans for surg tx at this time   dyslipidemia - on prava - reports compliance with ongoing medical treatment and no changes in medication dose or frequency. denies adverse side effects related to current therapy. no myalgias or GI problems   osteopenia - dexa 2010 - started on tx - several tried and dc's due side effects -  on boniva until 03/2010 when stopped by dentist due to jaw bone problem with dental work -  no bone pain or hx fx  Past Medical History  Diagnosis Date  . DEPRESSION   . DERMATITIS   . DYSLIPIDEMIA   . OSTEOPENIA   . Primary hyperparathyroidism    Family History  Problem Relation Age of Onset  . Colon cancer Brother    History  Substance Use Topics  . Smoking status: Former Smoker    Quit date: 12/06/1999  . Smokeless tobacco: Not on file   Comment: Married, lives wih spouse. Moved to [place] area from [place] in spring 2011  . Alcohol Use: Yes     social    Review of Systems Constitutional: Negative for fever or weight change.  Respiratory: Negative for cough and shortness of breath.   Cardiovascular: Negative for chest pain or palpitations.  Gastrointestinal: Negative for abdominal pain, no bowel changes.    Musculoskeletal: Negative for gait problem or joint swelling.  Skin: Negative for rash.  Neurological: Negative for dizziness or headache.  No other specific complaints in a complete review of systems (except as listed in HPI above).     Objective:   Physical Exam BP 110/62  Pulse 83  Temp(Src) 98.8 F (37.1 C) (Oral)  Wt 126 lb 6.4 oz (57.335 kg)  SpO2 97% Wt Readings from Last 3 Encounters:  10/21/11 126 lb 6.4 oz (57.335 kg)  09/20/10 126 lb (57.153 kg)  08/30/10 123 lb 4 oz (55.906 kg)   Constitutional: She is thin, fit; appears well-developed and well-nourished. No distress.  HENT: Head: Normocephalic and atraumatic. Ears: B TMs ok, no erythema or effusion; Nose: Nose normal.  Mouth/Throat: Oropharynx is clear and moist. No oropharyngeal exudate.  Eyes: Conjunctivae and EOM are normal. Pupils are equal, round, and reactive to light. No scleral icterus.  Neck: Normal range of motion. Neck supple. No JVD present. No thyromegaly present.  Cardiovascular: Normal rate, regular rhythm and normal heart sounds.  No murmur heard. No BLE edema. Pulmonary/Chest: Effort normal and breath sounds normal. No respiratory distress. She has no wheezes.  Abdominal: Soft. Bowel sounds are normal. She exhibits no distension. There is no tenderness. no masses Musculoskeletal: Normal range of motion, no joint effusions. No gross deformities Neurological: She is alert and oriented to person, place, and time. No cranial nerve deficit. Coordination normal.  Skin: Skin is  warm and dry. No rash noted. No erythema.  Psychiatric: She has a normal mood and affect. Her behavior is normal. Judgment and thought content normal.   Lab Results  Component Value Date   WBC 5.3 10/18/2011   HGB 15.0 10/18/2011   HCT 44.8 10/18/2011   PLT 235.0 10/18/2011   GLUCOSE 82 10/18/2011   CHOL 191 10/18/2011   TRIG 53.0 10/18/2011   HDL 82.10 10/18/2011   LDLDIRECT 142.8 07/25/2010   LDLCALC 98 10/18/2011   ALT 11  10/18/2011   AST 25 10/18/2011   NA 142 10/18/2011   K 4.5 10/18/2011   CL 105 10/18/2011   CREATININE 0.9 10/18/2011   BUN 19 10/18/2011   CO2 28 10/18/2011   TSH 1.14 10/18/2011   HGBA1C 6.7 08/08/2010       Assessment & Plan:  CPX - v70.0 - Patient has been counseled on age-appropriate routine health concerns for screening and prevention. These are reviewed and up-to-date. Immunizations are up-to-date or declined. Labs reviewed.  Also See problem list. Medications and labs reviewed today.

## 2011-10-21 NOTE — Assessment & Plan Note (Signed)
Endo eval 08/2010 for same - calcium levels and PTH stable asymptomatic - follow every 6-12 months

## 2011-11-13 ENCOUNTER — Telehealth: Payer: Self-pay

## 2011-11-13 NOTE — Telephone Encounter (Signed)
Pt called stating MD mentioned adding a "boaster" to her antidepressant at the last OV. Pt is interested in starting this "boaster", please advise.

## 2011-12-06 ENCOUNTER — Other Ambulatory Visit: Payer: Self-pay | Admitting: Internal Medicine

## 2012-04-15 ENCOUNTER — Telehealth: Payer: Self-pay | Admitting: Internal Medicine

## 2012-04-15 DIAGNOSIS — E210 Primary hyperparathyroidism: Secondary | ICD-10-CM

## 2012-04-15 NOTE — Telephone Encounter (Signed)
Ca and iPTH ordered in EMR - pls notify pt of same

## 2012-04-15 NOTE — Telephone Encounter (Signed)
PT WAS TOLD TO GO TO THE LAB THIS MONTH FOR 6 MONTH LABS.

## 2012-04-16 NOTE — Telephone Encounter (Signed)
Notified pt with md response... 04/16/12@10 :15am/LMB

## 2012-04-22 ENCOUNTER — Other Ambulatory Visit: Payer: BC Managed Care – PPO

## 2012-04-22 DIAGNOSIS — E210 Primary hyperparathyroidism: Secondary | ICD-10-CM

## 2012-04-24 LAB — PTH, INTACT AND CALCIUM: PTH: 102.5 pg/mL — ABNORMAL HIGH (ref 14.0–72.0)

## 2012-05-11 ENCOUNTER — Other Ambulatory Visit: Payer: Self-pay | Admitting: Internal Medicine

## 2012-05-11 MED ORDER — POLYMYXIN B-TRIMETHOPRIM 10000-0.1 UNIT/ML-% OP SOLN: 1.0000 [drp] | OPHTHALMIC | Status: AC

## 2012-05-11 NOTE — Telephone Encounter (Signed)
Caller: Courtney Hood/Patient; PCP: Rene Paci; CB#: [phone];  Call regarding Eye Redness, Swelling and Crusty; Swelling and redness left eye onset 05/10/12; lashes crusted shut on 05/10/12.  "Sticky yelow goo" in eye. Emergent sx ruled out. Home care for the interim and see provider in 24 hours.   Declined offer of appointment on 6/11/ 13.  Asks if provider will call in Rx to pharmacy.  Note to provider per Eye: Infection or Irritation protocol.

## 2012-05-11 NOTE — Telephone Encounter (Signed)
polytrim - please send to preferred pharmacy

## 2012-05-11 NOTE — Telephone Encounter (Signed)
Rx sent to pharmacy, pt informed.  

## 2012-05-23 ENCOUNTER — Other Ambulatory Visit: Payer: Self-pay | Admitting: Internal Medicine

## 2012-10-09 ENCOUNTER — Telehealth: Payer: Self-pay | Admitting: *Deleted

## 2012-10-09 DIAGNOSIS — Z0000 Encounter for general adult medical examination without abnormal findings: Secondary | ICD-10-CM

## 2012-10-09 DIAGNOSIS — E210 Primary hyperparathyroidism: Secondary | ICD-10-CM

## 2012-10-09 NOTE — Telephone Encounter (Signed)
Message copied by Deatra James on Fri Oct 09, 2012 11:03 AM ------      Message from: Livingston Diones      Created: Fri Oct 09, 2012 10:48 AM       Pt is having CPE appt in 10/21/12. Please order lab prior to this appt and pt also req if she can add an order to check calcium level. Please call pt once its done.

## 2012-10-09 NOTE — Telephone Encounter (Signed)
Received staff msg pt made cpx for 11/20. Entering cpx labs. Pt is also wanting PTH labs drawn also. Is this ok?...Raechel Chute

## 2012-10-12 NOTE — Telephone Encounter (Signed)
Yes - i entered future pth order

## 2012-10-12 NOTE — Telephone Encounter (Signed)
Called pt was not at home gave msg to husband labs has been entered...Raechel Chute

## 2012-10-14 ENCOUNTER — Other Ambulatory Visit (INDEPENDENT_AMBULATORY_CARE_PROVIDER_SITE_OTHER): Payer: BC Managed Care – PPO

## 2012-10-14 DIAGNOSIS — E210 Primary hyperparathyroidism: Secondary | ICD-10-CM

## 2012-10-14 DIAGNOSIS — Z0000 Encounter for general adult medical examination without abnormal findings: Secondary | ICD-10-CM

## 2012-10-14 LAB — URINALYSIS, ROUTINE W REFLEX MICROSCOPIC
Bilirubin Urine: NEGATIVE
Ketones, ur: NEGATIVE
Total Protein, Urine: NEGATIVE
Urine Glucose: NEGATIVE

## 2012-10-14 LAB — HEPATIC FUNCTION PANEL
ALT: 12 U/L (ref 0–35)
AST: 27 U/L (ref 0–37)
Alkaline Phosphatase: 55 U/L (ref 39–117)
Bilirubin, Direct: 0.1 mg/dL (ref 0.0–0.3)
Total Protein: 7.2 g/dL (ref 6.0–8.3)

## 2012-10-14 LAB — CBC WITH DIFFERENTIAL/PLATELET
Basophils Relative: 0.7 % (ref 0.0–3.0)
Eosinophils Relative: 1.6 % (ref 0.0–5.0)
MCV: 89 fl (ref 78.0–100.0)
Monocytes Absolute: 0.4 10*3/uL (ref 0.1–1.0)
Monocytes Relative: 7.3 % (ref 3.0–12.0)
Neutrophils Relative %: 57.3 % (ref 43.0–77.0)
RBC: 4.95 Mil/uL (ref 3.87–5.11)
WBC: 5.7 10*3/uL (ref 4.5–10.5)

## 2012-10-14 LAB — LIPID PANEL
HDL: 80 mg/dL (ref >39.00)
Triglycerides: 100 mg/dL (ref 0.0–149.0)
VLDL: 20 mg/dL (ref 0.0–40.0)

## 2012-10-14 LAB — BASIC METABOLIC PANEL
BUN: 13 mg/dL (ref 6–23)
Calcium: 10.6 mg/dL — ABNORMAL HIGH (ref 8.4–10.5)
GFR: 66.82 mL/min (ref >60.00)
Glucose, Bld: 70 mg/dL (ref 70–99)
Sodium: 141 mEq/L (ref 135–145)

## 2012-10-15 LAB — PTH, INTACT AND CALCIUM
Calcium, Total (PTH): 11.1 mg/dL — ABNORMAL HIGH (ref 8.4–10.5)
PTH: 108.5 pg/mL — ABNORMAL HIGH (ref 14.0–72.0)

## 2012-10-21 ENCOUNTER — Ambulatory Visit (INDEPENDENT_AMBULATORY_CARE_PROVIDER_SITE_OTHER): Payer: BC Managed Care – PPO | Admitting: Internal Medicine

## 2012-10-21 ENCOUNTER — Encounter: Payer: Self-pay | Admitting: Internal Medicine

## 2012-10-21 VITALS — BP 104/62 | HR 87 | Temp 97.9°F | Ht 67.0 in | Wt 115.0 lb

## 2012-10-21 DIAGNOSIS — M949 Disorder of cartilage, unspecified: Secondary | ICD-10-CM

## 2012-10-21 DIAGNOSIS — F329 Major depressive disorder, single episode, unspecified: Secondary | ICD-10-CM

## 2012-10-21 DIAGNOSIS — J019 Acute sinusitis, unspecified: Secondary | ICD-10-CM

## 2012-10-21 DIAGNOSIS — E785 Hyperlipidemia, unspecified: Secondary | ICD-10-CM

## 2012-10-21 DIAGNOSIS — Z0000 Encounter for general adult medical examination without abnormal findings: Secondary | ICD-10-CM

## 2012-10-21 MED ORDER — PROMETHAZINE-PHENYLEPHRINE 6.25-5 MG/5ML PO SYRP: 5.0000 mL | ORAL_SOLUTION | ORAL | Status: DC | PRN

## 2012-10-21 MED ORDER — AZITHROMYCIN 250 MG PO TABS: ORAL_TABLET | ORAL | Status: DC

## 2012-10-21 MED ORDER — VENLAFAXINE HCL ER 75 MG PO CP24: 75.0000 mg | ORAL_CAPSULE | Freq: Every day | ORAL | Status: DC

## 2012-10-21 NOTE — Patient Instructions (Signed)
It was good to see you today. We have reviewed your prior records including labs and tests today Health Maintenance reviewed - all recommended immunizations and age-appropriate screenings are up-to-date. Medications reviewed - try Calcium citrate or calcium carbonate (without Vit D added) Change cymbalta to generic Effexor Your prescription(s) have been submitted to your pharmacy. Please take as directed and contact our office if you believe you are having problem(s) with the medication(s). we'll make appointment for DEXA to look at bone density. Our office will contact you regarding appointment(s) once made. Your results will be released to MyChart (or called to you) after review, usually within 72hours after test completion. If any changes need to be made, you will be notified at that same time. Please schedule followup in 1 year, call sooner if problems.

## 2012-10-21 NOTE — Progress Notes (Signed)
Subjective:    Patient ID: Courtney Hood, female    DOB: [birth year], 66 y.o.   MRN: [id number]  HPI  patient is here today for annual physical. Patient feels well overall.  Also reviewed chronic medical issues: depression - hx same - reports compliance with ongoing medical treatment and no changes in medication dose or frequency. denies adverse side effects related to current therapy - changed zoloft to cymbalta 08/2010. on SSRI tx since 2004, seasonally worse (winter) denies SI/HI - declines need for counseling  mild primary hyperparathyroidism 08/2010 - dx made after noting hypercalcemia, mild in 2010 CPX labs - denies excess Ca intake - no muscle aches, constipation or kidney stones - s/p 2011 endo eval - no plans for surg tx    dyslipidemia - on prava - reports compliance with ongoing medical treatment and no changes in medication dose or frequency. denies adverse side effects related to current therapy. no myalgias or GI problems   osteopenia - dexa 2010 - started on tx - several tried and dc's due side effects -  on boniva until 03/2010 when stopped by dentist due to jaw bone problem with dental work -  no bone pain or hx fx  Also complains of sinus congestion and green yellow dc >7 days. Not improved with OTC meds  Past Medical History  Diagnosis Date  . DEPRESSION   . DERMATITIS   . DYSLIPIDEMIA   . OSTEOPENIA   . Primary hyperparathyroidism    Family History  Problem Relation Age of Onset  . Colon cancer Brother    History  Substance Use Topics  . Smoking status: Former Smoker    Quit date: 12/06/1999  . Smokeless tobacco: Not on file     Comment: Married, lives wih spouse. Moved to [place] area from [place] in spring 2011  . Alcohol Use: Yes     Comment: social    Review of Systems  Constitutional: Negative for fever or weight change.  Respiratory: Negative for cough and shortness of breath.   Cardiovascular: Negative for chest pain or palpitations.    Gastrointestinal: Negative for abdominal pain, no bowel changes.  Musculoskeletal: Negative for gait problem or joint swelling.  Skin: Negative for rash.  Neurological: Negative for dizziness or headache.  No other specific complaints in a complete review of systems (except as listed in HPI above).     Objective:   Physical Exam  BP 104/62  Pulse 87  Temp 97.9 F (36.6 C) (Oral)  Ht 5\' 7"  (1.702 m)  Wt 115 lb (52.164 kg)  BMI 18.01 kg/m2  SpO2 97% Wt Readings from Last 3 Encounters:  10/21/12 115 lb (52.164 kg)  10/21/11 126 lb 6.4 oz (57.335 kg)  09/20/10 126 lb (57.153 kg)   Constitutional: She is thin, fit; appears well-developed and well-nourished. No distress.  HENT: Head: Normocephalic and atraumatic - mild sinus tenderness to palp above and below eyes. Ears: B TMs with mild erythema and hazy effusion; Mouth/Throat: Oropharynx is clear and moist. PND evident. No oropharyngeal exudate.  Eyes: Conjunctivae and EOM are normal. Pupils are equal, round, and reactive to light. No scleral icterus.  Neck: Normal range of motion. Neck supple. Mild LAD. No JVD present. No thyromegaly present.  Cardiovascular: Normal rate, regular rhythm and normal heart sounds.  No murmur heard. No BLE edema. Pulmonary/Chest: Effort normal and breath sounds normal. No respiratory distress. She has no wheezes.  Abdominal: Soft. Bowel sounds are normal. She exhibits no distension. There is no tenderness.  no masses Musculoskeletal: Normal range of motion, no joint effusions. No gross deformities Neurological: She is alert and oriented to person, place, and time. No cranial nerve deficit. Coordination normal.  Skin: Skin is warm and dry. No rash noted. No erythema.  Psychiatric: She has a normal mood and affect. Her behavior is normal. Judgment and thought content normal.   Lab Results  Component Value Date   WBC 5.7 10/14/2012   HGB 14.8 10/14/2012   HCT 44.0 10/14/2012   PLT 242.0 10/14/2012    GLUCOSE 70 10/14/2012   CHOL 214* 10/14/2012   TRIG 100.0 10/14/2012   HDL 80.00 10/14/2012   LDLDIRECT 108.0 10/14/2012   LDLCALC 98 10/18/2011   ALT 12 10/14/2012   AST 27 10/14/2012   NA 141 10/14/2012   K 3.8 10/14/2012   CL 104 10/14/2012   CREATININE 0.9 10/14/2012   BUN 13 10/14/2012   CO2 30 10/14/2012   TSH 1.40 10/14/2012   HGBA1C 6.7 08/08/2010   ECG: sinus @ 75bpm, no ischemic changes -    Assessment & Plan:  CPX - v70.0 - Patient has been counseled on age-appropriate routine health concerns for screening and prevention. These are reviewed and up-to-date. Immunizations are up-to-date or declined. Labs reviewed.  Acute sinusitis - Zpak and decongest syrup  Also See problem list. Medications and labs reviewed today.

## 2012-10-21 NOTE — Assessment & Plan Note (Signed)
Intol of bisphos due to osteonecrosis problems with jaw - dental problems persist Check DEXA now and continue Ca + Vit D as tolerated

## 2012-10-21 NOTE — Assessment & Plan Note (Signed)
hx same - changed zoloft to cymbalta tx 08/2010. on SSRI tx since 2004  Large component of SAD (winter, dark cold months)  improved overall on cymbalta, but cost issues Change to generic effexor now -declines need for counseling - suggested light box therapy Pt to call if symptoms worse or unimproved

## 2012-10-21 NOTE — Assessment & Plan Note (Signed)
On prava since 2008 -  The current medical regimen is effective;  continue present plan and medications.  

## 2012-10-27 ENCOUNTER — Telehealth: Payer: Self-pay | Admitting: Internal Medicine

## 2012-10-27 NOTE — Telephone Encounter (Signed)
Patient Information:  Caller Name: Gabriel Rung  Phone: [phone]  Patient: Courtney Hood, Courtney Hood  Gender: Female  DOB: December 20, 1945  Age: 66 Years  PCP: Rene Paci (Adults only)   Symptoms  Reason For Call & Symptoms: cold symptoms  Reviewed Health History In EMR: Yes  Reviewed Medications In EMR: Yes  Reviewed Allergies In EMR: Yes  Date of Onset of Symptoms: 10/17/2012  Treatments Tried: has finished azithromycin but has not seen any improvement  Treatments Tried Worked: No  Guideline(s) Used:  Cough  Earache  Disposition Per Guideline:   See Today in Office  Reason For Disposition Reached:   All other earaches (Exceptions: earache lasting < 1 hour, and earache from air travel)  Advice Given:  N/A  Office Follow Up:  Does the office need to follow up with this patient?: Yes  Instructions For The Office: Patient requests "a different antibiotic."  krs/can  Patient Refused Recommendation:  Patient Requests Prescription  Patient requests "a different antibiotic." krs/can  RN Note:  Has had swollen glands which are tender to touch.  Afebrile.  Per protocol, emergent symptoms denied; advised and offered appt within 24 hours.  Patient refuses; wants "different antibiotic" called in.   Uses [facility]. May reach patient at [phone].

## 2012-10-28 ENCOUNTER — Telehealth: Payer: Self-pay | Admitting: Internal Medicine

## 2012-10-28 MED ORDER — AMOXICILLIN-POT CLAVULANATE 875-125 MG PO TABS: 1.0000 | ORAL_TABLET | Freq: Two times a day (BID) | ORAL | Status: DC

## 2012-10-28 NOTE — Telephone Encounter (Signed)
Patient Information:  Caller Name: Courtney Hood  Phone: [phone]  Patient: Courtney Hood, Courtney Hood  Gender: Female  DOB: [birth year]  Age: 66 Years  PCP: Rene Paci (Adults only)   Symptoms  Reason For Call & Symptoms: continues to have a horrible head cold, completed medication given for same, asking for another antibiotic  Reviewed Health History In EMR: Yes  Reviewed Medications In EMR: Yes  Reviewed Allergies In EMR: Yes  Reviewed Surgeries / Procedures: Yes  Date of Onset of Symptoms: 10/21/2012  Treatments Tried: Zpac  Treatments Tried Worked: Yes  Guideline(s) Used:  Colds  Disposition Per Guideline:   Home Care  Reason For Disposition Reached:   Colds with no complications  Advice Given:  N/A  Office Follow Up:  Does the office need to follow up with this patient?: Yes  Instructions For The Office: medication request  RN Note:  Patient is feeling some better.  Asking for additional antibiotics to be called in.  Teaching done on the continued effect of the Zpac for 2 weeks after her last dose which was on 11/24.  No fever.  Has a productive cough but the sputum is clear.  Has pressure in her ears but no pain.    She stopped taking the antihistamin/decongestant that she was taking on 11/25.

## 2012-10-28 NOTE — Telephone Encounter (Signed)
See prior phone note initiated 11/26 - Augmentin erx done earlier today

## 2012-10-28 NOTE — Telephone Encounter (Signed)
augmentin bid x 7 d- erx done

## 2012-10-28 NOTE — Telephone Encounter (Signed)
No answer, no VM

## 2012-10-28 NOTE — Telephone Encounter (Signed)
Pt advised of Rx/pharmacy 

## 2012-10-30 ENCOUNTER — Other Ambulatory Visit: Payer: Self-pay | Admitting: Internal Medicine

## 2012-11-04 ENCOUNTER — Ambulatory Visit (INDEPENDENT_AMBULATORY_CARE_PROVIDER_SITE_OTHER)
Admission: RE | Admit: 2012-11-04 | Discharge: 2012-11-04 | Disposition: A | Discharge status: Home / Self Care | Payer: BC Managed Care – PPO | Source: Ambulatory Visit

## 2012-11-04 DIAGNOSIS — M899 Disorder of bone, unspecified: Secondary | ICD-10-CM

## 2012-11-05 LAB — HM MAMMOGRAPHY: HM Mammogram: NEGATIVE

## 2012-11-09 ENCOUNTER — Encounter: Payer: Self-pay | Admitting: Internal Medicine

## 2013-01-20 ENCOUNTER — Other Ambulatory Visit: Payer: Self-pay | Admitting: Internal Medicine

## 2013-03-17 ENCOUNTER — Other Ambulatory Visit: Payer: Self-pay

## 2013-03-17 MED ORDER — PRAVASTATIN SODIUM 20 MG PO TABS: ORAL_TABLET | ORAL | Status: DC

## 2013-04-28 ENCOUNTER — Telehealth: Payer: Self-pay | Admitting: Internal Medicine

## 2013-04-28 DIAGNOSIS — E210 Primary hyperparathyroidism: Secondary | ICD-10-CM

## 2013-04-28 NOTE — Telephone Encounter (Signed)
Order entered. thanks

## 2013-04-28 NOTE — Telephone Encounter (Signed)
Pt  wants to know if she can have an order for blood work to check her Calcium levels she states that  She has this done every 6 mos . please call pt when order is placed.

## 2013-04-29 NOTE — Telephone Encounter (Signed)
Called pt spoke with husband gave md response.../lmb 

## 2013-06-17 ENCOUNTER — Other Ambulatory Visit (INDEPENDENT_AMBULATORY_CARE_PROVIDER_SITE_OTHER): Payer: BC Managed Care – PPO

## 2013-06-17 DIAGNOSIS — E210 Primary hyperparathyroidism: Secondary | ICD-10-CM

## 2013-06-17 LAB — BASIC METABOLIC PANEL
Chloride: 105 mEq/L (ref 96–112)
Creatinine, Ser: 0.9 mg/dL (ref 0.4–1.2)
Sodium: 140 mEq/L (ref 135–145)

## 2013-07-28 ENCOUNTER — Other Ambulatory Visit: Payer: Self-pay | Admitting: *Deleted

## 2013-07-28 MED ORDER — VENLAFAXINE HCL ER 75 MG PO CP24: ORAL_CAPSULE | ORAL | Status: DC

## 2013-10-11 ENCOUNTER — Telehealth: Payer: Self-pay | Admitting: Internal Medicine

## 2013-10-11 DIAGNOSIS — Z0000 Encounter for general adult medical examination without abnormal findings: Secondary | ICD-10-CM

## 2013-10-11 DIAGNOSIS — E785 Hyperlipidemia, unspecified: Secondary | ICD-10-CM

## 2013-10-11 DIAGNOSIS — E210 Primary hyperparathyroidism: Secondary | ICD-10-CM

## 2013-10-11 NOTE — Telephone Encounter (Signed)
Labs entered as requested -  but please also let pt know some insurances do not cover lab tests done prior to day of OV thanks

## 2013-10-11 NOTE — Telephone Encounter (Signed)
10/11/2013  Pt is requesting to have lab orders put in for 6 month lab check for thyroid.  Please advise.

## 2013-10-11 NOTE — Telephone Encounter (Signed)
Notified pt with md response.../lmb 

## 2013-10-13 ENCOUNTER — Ambulatory Visit (INDEPENDENT_AMBULATORY_CARE_PROVIDER_SITE_OTHER): Payer: Medicare Other

## 2013-10-13 DIAGNOSIS — Z23 Encounter for immunization: Secondary | ICD-10-CM

## 2013-11-01 ENCOUNTER — Other Ambulatory Visit: Payer: Self-pay | Admitting: Internal Medicine

## 2013-11-08 LAB — HM MAMMOGRAPHY

## 2013-11-09 ENCOUNTER — Encounter: Payer: Self-pay | Admitting: Internal Medicine

## 2013-12-13 ENCOUNTER — Encounter: Payer: Medicare Other | Admitting: Internal Medicine

## 2013-12-13 DIAGNOSIS — Z0289 Encounter for other administrative examinations: Secondary | ICD-10-CM

## 2014-01-31 ENCOUNTER — Encounter: Payer: Medicare Other | Admitting: Internal Medicine

## 2014-02-07 ENCOUNTER — Encounter: Payer: Self-pay | Admitting: Internal Medicine

## 2014-02-07 ENCOUNTER — Ambulatory Visit (INDEPENDENT_AMBULATORY_CARE_PROVIDER_SITE_OTHER): Payer: Commercial Managed Care - HMO | Admitting: Internal Medicine

## 2014-02-07 VITALS — BP 108/60 | HR 70 | Temp 98.3°F | Ht 67.0 in | Wt 127.0 lb

## 2014-02-07 DIAGNOSIS — Z1211 Encounter for screening for malignant neoplasm of colon: Secondary | ICD-10-CM

## 2014-02-07 DIAGNOSIS — Z23 Encounter for immunization: Secondary | ICD-10-CM

## 2014-02-07 DIAGNOSIS — F3289 Other specified depressive episodes: Secondary | ICD-10-CM

## 2014-02-07 DIAGNOSIS — F329 Major depressive disorder, single episode, unspecified: Secondary | ICD-10-CM

## 2014-02-07 DIAGNOSIS — Z0000 Encounter for general adult medical examination without abnormal findings: Secondary | ICD-10-CM

## 2014-02-07 MED ORDER — VENLAFAXINE HCL ER 150 MG PO CP24: 150.0000 mg | ORAL_CAPSULE | Freq: Every day | ORAL | Status: DC

## 2014-02-07 MED ORDER — PRAVASTATIN SODIUM 20 MG PO TABS: 20.0000 mg | ORAL_TABLET | Freq: Every day | ORAL | Status: DC

## 2014-02-07 NOTE — Progress Notes (Signed)
Pre-visit discussion using our clinic review tool. No additional management support is needed unless otherwise documented below in the visit note.  

## 2014-02-07 NOTE — Patient Instructions (Addendum)
It was good to see you today.  We have reviewed your prior records including labs and tests today  Health Maintenance reviewed - pneumonia vaccine updated today -all other recommended immunizations and age-appropriate screenings are up-to-date.  we'll make referral to gastroenterology for screening colonoscopy. Our office will contact you regarding appointment(s) once made.  Test(s) ordered today. Return when you're fasting for this test. Your results will be released to MyChart (or called to you) after review, usually within 72hours after test completion. If any changes need to be made, you will be notified at that same time.  Medications reviewed and updated, increase venlafaxine to 150 mg daily -no other changes recommended at this time.  Please schedule followup in 12 months for annual exam and labs, call sooner if problems.  Health Maintenance, Female A healthy lifestyle and preventative care can promote health and wellness.  Maintain regular health, dental, and eye exams.  Eat a healthy diet. Foods like vegetables, fruits, whole grains, low-fat dairy products, and lean protein foods contain the nutrients you need without too many calories. Decrease your intake of foods high in solid fats, added sugars, and salt. Get information about a proper diet from your caregiver, if necessary.  Regular physical exercise is one of the most important things you can do for your health. Most adults should get at least 150 minutes of moderate-intensity exercise (any activity that increases your heart rate and causes you to sweat) each week. In addition, most adults need muscle-strengthening exercises on 2 or more days a week.   Maintain a healthy weight. The body mass index (BMI) is a screening tool to identify possible weight problems. It provides an estimate of body fat based on height and weight. Your caregiver can help determine your BMI, and can help you achieve or maintain a healthy weight. For  adults 20 years and older:  A BMI below 18.5 is considered underweight.  A BMI of 18.5 to 24.9 is normal.  A BMI of 25 to 29.9 is considered overweight.  A BMI of 30 and above is considered obese.  Maintain normal blood lipids and cholesterol by exercising and minimizing your intake of saturated fat. Eat a balanced diet with plenty of fruits and vegetables. Blood tests for lipids and cholesterol should begin at age 55 and be repeated every 5 years. If your lipid or cholesterol levels are high, you are over 50, or you are a high risk for heart disease, you may need your cholesterol levels checked more frequently.Ongoing high lipid and cholesterol levels should be treated with medicines if diet and exercise are not effective.  If you smoke, find out from your caregiver how to quit. If you do not use tobacco, do not start.  Lung cancer screening is recommended for adults aged 70 80 years who are at high risk for developing lung cancer because of a history of smoking. Yearly low-dose computed tomography (CT) is recommended for people who have at least a 30-pack-year history of smoking and are a current smoker or have quit within the past 15 years. A pack year of smoking is smoking an average of 1 pack of cigarettes a day for 1 year (for example: 1 pack a day for 30 years or 2 packs a day for 15 years). Yearly screening should continue until the smoker has stopped smoking for at least 15 years. Yearly screening should also be stopped for people who develop a health problem that would prevent them from having lung cancer treatment.  If you are pregnant, do not drink alcohol. If you are breastfeeding, be very cautious about drinking alcohol. If you are not pregnant and choose to drink alcohol, do not exceed 1 drink per day. One drink is considered to be 12 ounces (355 mL) of beer, 5 ounces (148 mL) of wine, or 1.5 ounces (44 mL) of liquor.  Avoid use of street drugs. Do not share needles with anyone. Ask  for help if you need support or instructions about stopping the use of drugs.  High blood pressure causes heart disease and increases the risk of stroke. Blood pressure should be checked at least every 1 to 2 years. Ongoing high blood pressure should be treated with medicines, if weight loss and exercise are not effective.  If you are 24 to 66 years old, ask your caregiver if you should take aspirin to prevent strokes.  Diabetes screening involves taking a blood sample to check your fasting blood sugar level. This should be done once every 3 years, after age 56, if you are within normal weight and without risk factors for diabetes. Testing should be considered at a younger age or be carried out more frequently if you are overweight and have at least 1 risk factor for diabetes.  Breast cancer screening is essential preventative care for women. You should practice "breast self-awareness." This means understanding the normal appearance and feel of your breasts and may include breast self-examination. Any changes detected, no matter how small, should be reported to a caregiver. Women in their 88s and 30s should have a clinical breast exam (CBE) by a caregiver as part of a regular health exam every 1 to 3 years. After age 55, women should have a CBE every year. Starting at age 39, women should consider having a mammogram (breast X-ray) every year. Women who have a family history of breast cancer should talk to their caregiver about genetic screening. Women at a high risk of breast cancer should talk to their caregiver about having an MRI and a mammogram every year.  Breast cancer gene (BRCA)-related cancer risk assessment is recommended for women who have family members with BRCA-related cancers. BRCA-related cancers include breast, ovarian, tubal, and peritoneal cancers. Having family members with these cancers may be associated with an increased risk for harmful changes (mutations) in the breast cancer genes  BRCA1 and BRCA2. Results of the assessment will determine the need for genetic counseling and BRCA1 and BRCA2 testing.  The Pap test is a screening test for cervical cancer. Women should have a Pap test starting at age 81. Between ages 32 and 28, Pap tests should be repeated every 2 years. Beginning at age 5, you should have a Pap test every 3 years as long as the past 3 Pap tests have been normal. If you had a hysterectomy for a problem that was not cancer or a condition that could lead to cancer, then you no longer need Pap tests. If you are between ages 61 and 68, and you have had normal Pap tests going back 10 years, you no longer need Pap tests. If you have had past treatment for cervical cancer or a condition that could lead to cancer, you need Pap tests and screening for cancer for at least 20 years after your treatment. If Pap tests have been discontinued, risk factors (such as a new sexual partner) need to be reassessed to determine if screening should be resumed. Some women have medical problems that increase the chance of getting cervical  cancer. In these cases, your caregiver may recommend more frequent screening and Pap tests.  The human papillomavirus (HPV) test is an additional test that may be used for cervical cancer screening. The HPV test looks for the virus that can cause the cell changes on the cervix. The cells collected during the Pap test can be tested for HPV. The HPV test could be used to screen women aged 63 years and older, and should be used in women of any age who have unclear Pap test results. After the age of 38, women should have HPV testing at the same frequency as a Pap test.  Colorectal cancer can be detected and often prevented. Most routine colorectal cancer screening begins at the age of 37 and continues through age 74. However, your caregiver may recommend screening at an earlier age if you have risk factors for colon cancer. On a yearly basis, your caregiver may  provide home test kits to check for hidden blood in the stool. Use of a small camera at the end of a tube, to directly examine the colon (sigmoidoscopy or colonoscopy), can detect the earliest forms of colorectal cancer. Talk to your caregiver about this at age 78, when routine screening begins. Direct examination of the colon should be repeated every 5 to 10 years through age 61, unless early forms of pre-cancerous polyps or small growths are found.  Hepatitis C blood testing is recommended for all people born from [birth year] through 1963 and any individual with known risks for hepatitis C.  Practice safe sex. Use condoms and avoid high-risk sexual practices to reduce the spread of sexually transmitted infections (STIs). Sexually active women aged 5 and younger should be checked for Chlamydia, which is a common sexually transmitted infection. Older women with new or multiple partners should also be tested for Chlamydia. Testing for other STIs is recommended if you are sexually active and at increased risk.  Osteoporosis is a disease in which the bones lose minerals and strength with aging. This can result in serious bone fractures. The risk of osteoporosis can be identified using a bone density scan. Women ages 35 and over and women at risk for fractures or osteoporosis should discuss screening with their caregivers. Ask your caregiver whether you should be taking a calcium supplement or vitamin D to reduce the rate of osteoporosis.  Menopause can be associated with physical symptoms and risks. Hormone replacement therapy is available to decrease symptoms and risks. You should talk to your caregiver about whether hormone replacement therapy is right for you.  Use sunscreen. Apply sunscreen liberally and repeatedly throughout the day. You should seek shade when your shadow is shorter than you. Protect yourself by wearing long sleeves, pants, a wide-brimmed hat, and sunglasses year round, whenever you are  outdoors.  Notify your caregiver of new moles or changes in moles, especially if there is a change in shape or color. Also notify your caregiver if a mole is larger than the size of a pencil eraser.  Stay current with your immunizations. Document Released: 06/03/2011 Document Revised: 03/15/2013 Document Reviewed: 06/03/2011 [facility] Patient Information 2014 [place].

## 2014-02-07 NOTE — Progress Notes (Signed)
Subjective:    Patient ID: Courtney Hood, female    DOB: [birth year], 68 y.o.   MRN: [id number]  HPI  Patient here today for annual follow-up.     Here for medicare wellness  Diet: heart healthy or DM if diabetic Physical activity: sedentary Depression/mood screen: negative Hearing: intact to whispered voice Visual acuity: grossly normal, performs annual eye exam  ADLs: capable Fall risk: none Home safety: good Cognitive evaluation: intact to orientation, naming, recall and repetition EOL planning: adv directives, full code/ I agree  I have personally reviewed and have noted 1. The patient's medical and social history 2. Their use of alcohol, tobacco or illicit drugs 3. Their current medications and supplements 4. The patient's functional ability including ADL's, fall risks, home safety risks and hearing or visual impairment. 5. Diet and physical activities 6. Evidence for depression or mood disorders   Chronic medical issues also reviewed.  depression - hx same - reports compliance with ongoing medical treatment and no changes in medication dose or frequency. denies adverse side effects related to current therapy - changed zoloft to cymbalta 08/2010. on SSRI tx since 2004, seasonally worse (winter) denies SI/HI - declines need for counseling.  States that sx have been worse over winter months.  She is more tearful and sleeping an increased amount lately.   mild primary hyperparathyroidism 08/2010 - dx made after noting hypercalcemia, mild in 2010 CPX labs - denies excess Ca intake - no muscle aches, constipation or kidney stones - s/p 2011 endo eval - no plans for surg tx   dyslipidemia - on prava - reports compliance with ongoing medical treatment and no changes in medication dose or frequency. denies adverse side effects related to current therapy. no myalgias or GI problems   osteopenia - dexa 2010 - started on tx - several tried and dc's due side effects -  on boniva  until 03/2010 when stopped by dentist due to jaw bone problem with dental work -  no bone pain or hx fx   Past Medical History  Diagnosis Date  . DEPRESSION   . DERMATITIS   . DYSLIPIDEMIA   . OSTEOPENIA   . Primary hyperparathyroidism    Family History  Problem Relation Age of Onset  . Colon cancer Brother    History   Social History  . Marital Status: Married    Spouse Name: N/A    Number of Children: N/A  . Years of Education: N/A   Occupational History  . Not on file.   Social History Main Topics  . Smoking status: Former Smoker    Quit date: 12/06/1999  . Smokeless tobacco: Not on file     Comment: Married, lives wih spouse. Moved to [place] area from [place] in spring 2011  . Alcohol Use: Yes     Comment: social  . Drug Use: No  . Sexual Activity:    Other Topics Concern  . Not on file   Social History Narrative  . No narrative on file     Review of Systems  Constitutional: Negative for fever, chills, activity change and appetite change.  HENT: Negative for congestion, ear pain, postnasal drip, rhinorrhea, sinus pressure and sore throat.   Eyes: Negative for visual disturbance.  Respiratory: Negative for cough, chest tightness, shortness of breath and wheezing.   Cardiovascular: Negative for chest pain, palpitations and leg swelling.  Gastrointestinal: Negative for nausea, vomiting, diarrhea, constipation and abdominal distention.  Endocrine: Negative for cold intolerance and heat intolerance.  Musculoskeletal: Negative for arthralgias.  Skin: Negative for rash and wound.       Bilateral great toe fungal infections  Neurological: Negative for dizziness, seizures, syncope, weakness and headaches.  Psychiatric/Behavioral: Positive for sleep disturbance (excessive sleepiness) and dysphoric mood. Negative for suicidal ideas and self-injury.  All other systems reviewed and are negative.       Objective:   Physical Exam  Vitals reviewed. Constitutional:  She is oriented to person, place, and time. She appears well-developed and well-nourished. No distress.  HENT:  Head: Normocephalic and atraumatic.  Right Ear: External ear normal.  Left Ear: External ear normal.  Nose: Nose normal.  Mouth/Throat: Oropharynx is clear and moist. No oropharyngeal exudate.  Eyes: Conjunctivae are normal. Pupils are equal, round, and reactive to light. Right eye exhibits no discharge. Left eye exhibits no discharge. No scleral icterus.  Neck: Normal range of motion. Neck supple. No thyromegaly present.  Cardiovascular: Normal rate, regular rhythm and normal heart sounds.   No murmur heard. Pulmonary/Chest: Effort normal and breath sounds normal. No respiratory distress. She has no wheezes.  Abdominal: Soft. Bowel sounds are normal. She exhibits no distension and no mass. There is no guarding.  Musculoskeletal: Normal range of motion. She exhibits no edema and no tenderness.  Lymphadenopathy:    She has no cervical adenopathy.  Neurological: She is alert and oriented to person, place, and time.  Skin: Skin is warm and dry. No rash noted. She is not diaphoretic.  Psychiatric: She has a normal mood and affect. Her behavior is normal. Judgment and thought content normal.     Wt Readings from Last 3 Encounters:  02/07/14 127 lb (57.607 kg)  10/21/12 115 lb (52.164 kg)  10/21/11 126 lb 6.4 oz (57.335 kg)   BP Readings from Last 3 Encounters:  02/07/14 108/60  10/21/12 104/62  10/21/11 110/62   Lab Results  Component Value Date   WBC 5.7 10/14/2012   HGB 14.8 10/14/2012   HCT 44.0 10/14/2012   PLT 242.0 10/14/2012   GLUCOSE 91 06/17/2013   CHOL 214* 10/14/2012   TRIG 100.0 10/14/2012   HDL 80.00 10/14/2012   LDLDIRECT 108.0 10/14/2012   LDLCALC 98 10/18/2011   ALT 12 10/14/2012   AST 27 10/14/2012   NA 140 06/17/2013   K 4.6 06/17/2013   CL 105 06/17/2013   CREATININE 0.9 06/17/2013   BUN 20 06/17/2013   CO2 29 06/17/2013   TSH 1.40 10/14/2012    HGBA1C 6.7 08/08/2010       Assessment & Plan:   CPx/v700 - Patient has been counseled on age-appropriate routine health concerns for screening and prevention. These are reviewed and up-to-date. Immunizations are up-to-date or declined. Labs ordered and reviewed.  Problem List Items Addressed This Visit   DEPRESSION     hx same - changed zoloft to cymbalta tx 08/2010. on SSRI tx since 2004  Large component of SAD (winter, dark cold months)  improved overall on cymbalta, but cost issues Changed to generic effexor 10/2012 - overall improved but recurrent sx Increase dose now declines need for counseling - suggested light box therapy Pt to call if symptoms worse or unimproved     Relevant Medications      venlafaxine (EFFEXOR-XR) 24 hr capsule    Other Visit Diagnoses   Routine general medical examination at a health care facility    -  Primary    Relevant Orders       Basic metabolic panel  CBC with Differential       Hepatic function panel       Lipid panel       TSH       Urinalysis, Routine w reflex microscopic    Special screening for malignant neoplasms, colon        Relevant Orders       Ambulatory referral to Gastroenterology

## 2014-02-07 NOTE — Assessment & Plan Note (Signed)
hx same - changed zoloft to cymbalta tx 08/2010. on SSRI tx since 2004  Large component of SAD (winter, dark cold months)  improved overall on cymbalta, but cost issues Changed to generic effexor 10/2012 - overall improved but recurrent sx Increase dose now declines need for counseling - suggested light box therapy Pt to call if symptoms worse or unimproved

## 2014-02-11 ENCOUNTER — Encounter: Payer: Self-pay | Admitting: Gastroenterology

## 2014-02-11 ENCOUNTER — Other Ambulatory Visit (INDEPENDENT_AMBULATORY_CARE_PROVIDER_SITE_OTHER): Payer: Commercial Managed Care - HMO

## 2014-02-11 DIAGNOSIS — Z0000 Encounter for general adult medical examination without abnormal findings: Secondary | ICD-10-CM

## 2014-02-11 LAB — TSH: TSH: 2.7 u[IU]/mL (ref 0.35–5.50)

## 2014-02-11 LAB — URINALYSIS, ROUTINE W REFLEX MICROSCOPIC
BILIRUBIN URINE: NEGATIVE
KETONES UR: NEGATIVE
Nitrite: NEGATIVE
Specific Gravity, Urine: 1.02 (ref 1.000–1.030)
TOTAL PROTEIN, URINE-UPE24: NEGATIVE
UROBILINOGEN UA: 0.2 (ref 0.0–1.0)
Urine Glucose: NEGATIVE
pH: 6 (ref 5.0–8.0)

## 2014-02-11 LAB — CBC WITH DIFFERENTIAL/PLATELET
BASOS PCT: 0.5 % (ref 0.0–3.0)
Basophils Absolute: 0 10*3/uL (ref 0.0–0.1)
EOS ABS: 0.1 10*3/uL (ref 0.0–0.7)
Eosinophils Relative: 1.3 % (ref 0.0–5.0)
HCT: 45.7 % (ref 36.0–46.0)
Hemoglobin: 15.2 g/dL — ABNORMAL HIGH (ref 12.0–15.0)
LYMPHS PCT: 37 % (ref 12.0–46.0)
Lymphs Abs: 2.2 10*3/uL (ref 0.7–4.0)
MCHC: 33.2 g/dL (ref 30.0–36.0)
MCV: 90.3 fl (ref 78.0–100.0)
Monocytes Absolute: 0.5 10*3/uL (ref 0.1–1.0)
Monocytes Relative: 7.6 % (ref 3.0–12.0)
NEUTROS PCT: 53.6 % (ref 43.0–77.0)
Neutro Abs: 3.2 10*3/uL (ref 1.4–7.7)
Platelets: 239 10*3/uL (ref 150.0–400.0)
RBC: 5.06 Mil/uL (ref 3.87–5.11)
RDW: 13.4 % (ref 11.5–14.6)
WBC: 6 10*3/uL (ref 4.5–10.5)

## 2014-02-11 LAB — LIPID PANEL
CHOL/HDL RATIO: 3
Cholesterol: 215 mg/dL — ABNORMAL HIGH (ref 0–200)
HDL: 82 mg/dL (ref >39.00)
LDL CALC: 118 mg/dL — AB (ref 0–99)
Triglycerides: 74 mg/dL (ref 0.0–149.0)
VLDL: 14.8 mg/dL (ref 0.0–40.0)

## 2014-02-11 LAB — BASIC METABOLIC PANEL
BUN: 16 mg/dL (ref 6–23)
CO2: 28 meq/L (ref 19–32)
CREATININE: 0.9 mg/dL (ref 0.4–1.2)
Calcium: 10.5 mg/dL (ref 8.4–10.5)
Chloride: 104 mEq/L (ref 96–112)
GFR: 71.09 mL/min (ref >60.00)
Glucose, Bld: 87 mg/dL (ref 70–99)
POTASSIUM: 4.6 meq/L (ref 3.5–5.1)
SODIUM: 140 meq/L (ref 135–145)

## 2014-02-11 LAB — HEPATIC FUNCTION PANEL
ALK PHOS: 61 U/L (ref 39–117)
ALT: 12 U/L (ref 0–35)
AST: 29 U/L (ref 0–37)
Albumin: 4.3 g/dL (ref 3.5–5.2)
Bilirubin, Direct: 0 mg/dL (ref 0.0–0.3)
TOTAL PROTEIN: 7.2 g/dL (ref 6.0–8.3)
Total Bilirubin: 0.6 mg/dL (ref 0.3–1.2)

## 2014-03-02 ENCOUNTER — Telehealth: Payer: Self-pay | Admitting: *Deleted

## 2014-03-02 MED ORDER — VENLAFAXINE HCL ER 75 MG PO CP24: 75.0000 mg | ORAL_CAPSULE | Freq: Every day | ORAL | Status: DC

## 2014-03-02 NOTE — Telephone Encounter (Signed)
Reduce back to prior dose effexor (75mg  daily) but will need OV to review other med options if symptoms remain unchanged or worse To ER if feels suicidal or homicidal - or call 911 thanks

## 2014-03-02 NOTE — Telephone Encounter (Signed)
Pt called states since the increase of her antidepressant she feels worse.  Please advise

## 2014-03-02 NOTE — Telephone Encounter (Signed)
Spoke with pt advised of MDs message 

## 2014-03-21 ENCOUNTER — Ambulatory Visit (AMBULATORY_SURGERY_CENTER): Payer: Commercial Managed Care - HMO | Admitting: *Deleted

## 2014-03-21 ENCOUNTER — Telehealth: Payer: Self-pay | Admitting: *Deleted

## 2014-03-21 VITALS — Ht 67.0 in | Wt 129.4 lb

## 2014-03-21 DIAGNOSIS — Z800 Family history of malignant neoplasm of digestive organs: Secondary | ICD-10-CM

## 2014-03-21 MED ORDER — MOVIPREP 100 G PO SOLR: ORAL | Status: DC

## 2014-03-21 NOTE — Progress Notes (Signed)
Patient denies any allergies to eggs or soy. Patient denies any problems with anesthesia. No oxygen use at home. No diet pills. EMMI information given to patient on colonoscopy.

## 2014-03-21 NOTE — Telephone Encounter (Signed)
Patient is for recall colonoscopy on 04/01/14. Last colonoscopy was 07/2008 in [place], this information is in EPIC. Patient states family hx colon cancer in brother at age 66. Patient also has hx colon polyps on colonoscopy before 2009. Patient denies any GI complaints. Okay for direct colonoscopy at this time? Thanks,Tashia Leiterman,PV

## 2014-03-21 NOTE — Telephone Encounter (Signed)
Yes, FH of colon cancer, she needs colonoscoph for cancer screening about every 5 years.

## 2014-03-30 ENCOUNTER — Encounter: Payer: Commercial Managed Care - HMO | Admitting: Gastroenterology

## 2014-04-01 ENCOUNTER — Other Ambulatory Visit: Payer: Self-pay | Admitting: Gastroenterology

## 2014-04-01 ENCOUNTER — Ambulatory Visit (AMBULATORY_SURGERY_CENTER): Payer: Commercial Managed Care - HMO | Admitting: Gastroenterology

## 2014-04-01 ENCOUNTER — Encounter: Payer: Self-pay | Admitting: Gastroenterology

## 2014-04-01 VITALS — BP 125/74 | HR 59 | Temp 97.2°F | Resp 21 | Ht 67.0 in | Wt 129.0 lb

## 2014-04-01 DIAGNOSIS — Z800 Family history of malignant neoplasm of digestive organs: Secondary | ICD-10-CM

## 2014-04-01 DIAGNOSIS — D126 Benign neoplasm of colon, unspecified: Secondary | ICD-10-CM

## 2014-04-01 DIAGNOSIS — Z86010 Personal history of colonic polyps: Secondary | ICD-10-CM

## 2014-04-01 MED ORDER — SODIUM CHLORIDE 0.9 % IV SOLN: 500.0000 mL | INTRAVENOUS | Status: DC

## 2014-04-01 NOTE — Patient Instructions (Addendum)
YOU HAD AN ENDOSCOPIC PROCEDURE TODAY AT THE [facility]: Refer to the procedure report that was given to you for any specific questions about what was found during the examination.  If the procedure report does not answer your questions, please call your gastroenterologist to clarify.  If you requested that your care partner not be given the details of your procedure findings, then the procedure report has been included in a sealed envelope for you to review at your convenience later.  YOU SHOULD EXPECT: Some feelings of bloating in the abdomen. Passage of more gas than usual.  Walking can help get rid of the air that was put into your GI tract during the procedure and reduce the bloating. If you had a lower endoscopy (such as a colonoscopy or flexible sigmoidoscopy) you may notice spotting of blood in your stool or on the toilet paper. If you underwent a bowel prep for your procedure, then you may not have a normal bowel movement for a few days.  DIET: Your first meal following the procedure should be a light meal and then it is ok to progress to your normal diet.  A half-sandwich or bowl of soup is an example of a good first meal.  Heavy or fried foods are harder to digest and may make you feel nauseous or bloated.  Likewise meals heavy in dairy and vegetables can cause extra gas to form and this can also increase the bloating.  Drink plenty of fluids but you should avoid alcoholic beverages for 24 hours.  ACTIVITY: Your care partner should take you home directly after the procedure.  You should plan to take it easy, moving slowly for the rest of the day.  You can resume normal activity the day after the procedure however you should NOT DRIVE or use heavy machinery for 24 hours (because of the sedation medicines used during the test).    SYMPTOMS TO REPORT IMMEDIATELY: A gastroenterologist can be reached at any hour.  During normal business hours, 8:30 AM to 5:00 PM Monday through Friday,  call [phone].  After hours and on weekends, please call the GI answering service at [phone] who will take a message and have the physician on call contact you.   Following lower endoscopy (colonoscopy or flexible sigmoidoscopy):  Excessive amounts of blood in the stool  Significant tenderness or worsening of abdominal pains  Swelling of the abdomen that is new, acute  Fever of 100F or higher  Black, tarry-looking stools  FOLLOW UP: If any biopsies were taken you will be contacted by phone or by letter within the next 1-3 weeks.  Call your gastroenterologist if you have not heard about the biopsies in 3 weeks.  Our staff will call the home number listed on your records the next business day following your procedure to check on you and address any questions or concerns that you may have at that time regarding the information given to you following your procedure. This is a courtesy call and so if there is no answer at the home number and we have not heard from you through the emergency physician on call, we will assume that you have returned to your regular daily activities without incident.  SIGNATURES/CONFIDENTIALITY: You and/or your care partner have signed paperwork which will be entered into your electronic medical record.  These signatures attest to the fact that that the information above on your After Visit Summary has been reviewed and is understood.  Full responsibility of   the confidentiality of this discharge information lies with you and/or your care-partner.  Recommendations Await pathology results. Next colonoscopy in 5 years given your family history of colon cancer.

## 2014-04-01 NOTE — Op Note (Signed)
[place]  [address] [place], [id number]   COLONOSCOPY PROCEDURE REPORT  PATIENT: Courtney Hood, Courtney Hood  MR#: [id number] BIRTHDATE: 11-28-1946 , 36  yrs. old GENDER: Female ENDOSCOPIST: Milus Banister, MD REFERRED [id number] Asa Lente, M.D. PROCEDURE DATE:  04/01/2014 PROCEDURE:   Colonoscopy with snare polypectomy First Screening Colonoscopy - Avg.  risk and is 50 yrs.  old or older - No.  Prior Negative Screening - Now for repeat screening. N/A  History of Adenoma - Now for follow-up colonoscopy & has been > or = to 3 yrs.  Yes hx of adenoma.  Has been 3 or more years since last colonoscopy.  Polyps Removed Today? Yes. ASA CLASS:   Class II INDICATIONS:elevated risk screening, brother had colon cancer in his 54s MEDICATIONS: MAC sedation, administered by CRNA and propofol (Diprivan) 250mg  IV  DESCRIPTION OF PROCEDURE:   After the risks benefits and alternatives of the procedure were thoroughly explained, informed consent was obtained.  A digital rectal exam revealed no abnormalities of the rectum.   The LB [id number]  endoscope was introduced through the anus and advanced to the cecum, which was identified by both the appendix and ileocecal valve. No adverse events experienced.   The quality of the prep was excellent.  The instrument was then slowly withdrawn as the colon was fully examined.  COLON FINDINGS: Two polyps were found, removed and sent to pathology.  One was 5mm across, sessile, located in descending segment, removed with snare/cautery.  The other was sessile, 60mm across, located in sigmoid, removed with cold snare.  The examination was otherwise normal.  Retroflexed views revealed no abnormalities. The time to cecum=6 minutes 18 seconds.  Withdrawal time=12 minutes 22 seconds.  The scope was withdrawn and the procedure completed. COMPLICATIONS: There were no complications.  ENDOSCOPIC IMPRESSION: Two polyps were found, removed and sent  to pathology.  One was 58mm across, sessile, located in descending segment, removed with snare/cautery.  The other was sessile, 34mm across, located in sigmoid, removed with cold snare.  The examination was otherwise normal.  RECOMMENDATIONS: Given your significant family history of colon cancer, you should have a repeat colonoscopy in 5 years even if the polyps removed today are NOT pre-cancerous.  You will receive a letter within 1-2 weeks with the results of your biopsy as well as final recommendations.  Please call my office if you have not received a letter after 3 weeks.   eSigned:  Milus Banister, MD 04/01/2014 8:28 AM

## 2014-04-01 NOTE — Progress Notes (Signed)
Called to room to assist during endoscopic procedure.  Patient ID and intended procedure confirmed with present staff. Received instructions for my participation in the procedure from the performing physician.  

## 2014-04-01 NOTE — Progress Notes (Signed)
A/ox3 pleased with MAC, report to Wendy RN 

## 2014-04-04 ENCOUNTER — Telehealth: Payer: Self-pay | Admitting: *Deleted

## 2014-04-04 NOTE — Telephone Encounter (Signed)
  Follow up Call-  Call back number 04/01/2014  Post procedure Call Back phone  # [phone]  Permission to leave phone message Yes     Patient questions:  Do you have a fever, pain , or abdominal swelling? no Pain Score  0 *  Have you tolerated food without any problems? yes  Have you been able to return to your normal activities? yes  Do you have any questions about your discharge instructions: Diet   no Medications  no Follow up visit  no  Do you have questions or concerns about your Care? yes  Actions: * If pain score is 4 or above: No action needed, pain <4.

## 2014-04-05 ENCOUNTER — Encounter: Payer: Self-pay | Admitting: Gastroenterology

## 2014-04-06 ENCOUNTER — Ambulatory Visit (INDEPENDENT_AMBULATORY_CARE_PROVIDER_SITE_OTHER): Payer: Commercial Managed Care - HMO | Admitting: Internal Medicine

## 2014-04-06 ENCOUNTER — Encounter: Payer: Self-pay | Admitting: Internal Medicine

## 2014-04-06 VITALS — BP 100/64 | HR 73 | Temp 98.1°F | Wt 128.8 lb

## 2014-04-06 DIAGNOSIS — F329 Major depressive disorder, single episode, unspecified: Secondary | ICD-10-CM

## 2014-04-06 DIAGNOSIS — F3289 Other specified depressive episodes: Secondary | ICD-10-CM

## 2014-04-06 MED ORDER — DULOXETINE HCL 30 MG PO CPEP: 30.0000 mg | ORAL_CAPSULE | Freq: Every day | ORAL | Status: DC

## 2014-04-06 NOTE — Progress Notes (Signed)
Pre visit review using our clinic review tool, if applicable. No additional management support is needed unless otherwise documented below in the visit note. 

## 2014-04-06 NOTE — Assessment & Plan Note (Addendum)
Long hx same hanged zoloft to cymbalta tx 08/2010. on SSRI tx since 2004  Large component of SAD (winter, dark cold months) but year round chronic sx previously improved overall on cymbalta, but cost issues prompted change to generic effexor 10/2012 Initially seemed improved but recurrent sx -  trial increase Effexor 01/2014 caused side effects (dizzy, fuzzy brain) Will re-rx cymbalta now - consider other alternatives if remains cost prohibitive  declines need for counseling - no SI/HI Pt to call if symptoms worse or unimproved

## 2014-04-06 NOTE — Progress Notes (Signed)
   Subjective:    Patient ID: Courtney Hood, female    DOB: 12/27/1945, 68 y.o.   MRN: [id number]  HPI  Patient is here for follow up  Reviewed chronic medical issues and interval medical events  Past Medical History  Diagnosis Date  . DEPRESSION   . DERMATITIS   . DYSLIPIDEMIA   . OSTEOPENIA   . Primary hyperparathyroidism     Review of Systems  Constitutional: Negative for fever, fatigue and unexpected weight change.  Respiratory: Negative for cough and shortness of breath.   Cardiovascular: Negative for chest pain and leg swelling.  Psychiatric/Behavioral: Positive for dysphoric mood and decreased concentration. Negative for suicidal ideas, hallucinations, sleep disturbance and self-injury.       Objective:   Physical Exam  BP 100/64  Pulse 73  Temp(Src) 98.1 F (36.7 C) (Oral)  Wt 128 lb 12.8 oz (58.423 kg)  SpO2 97% Wt Readings from Last 3 Encounters:  04/06/14 128 lb 12.8 oz (58.423 kg)  04/01/14 129 lb (58.514 kg)  03/21/14 129 lb 6.4 oz (58.695 kg)   Constitutional: She appears well-developed and well-nourished. No distress.  Neck: Normal range of motion. Neck supple. No JVD present. No thyromegaly present.  Cardiovascular: Normal rate, regular rhythm and normal heart sounds.  No murmur heard. No BLE edema. Pulmonary/Chest: Effort normal and breath sounds normal. No respiratory distress. She has no wheezes.  Psychiatric: She has a dysphoric mood and affect. Her behavior is normal. Judgment and thought content normal.   Lab Results  Component Value Date   WBC 6.0 02/11/2014   HGB 15.2* 02/11/2014   HCT 45.7 02/11/2014   PLT 239.0 02/11/2014   GLUCOSE 87 02/11/2014   CHOL 215* 02/11/2014   TRIG 74.0 02/11/2014   HDL 82.00 02/11/2014   LDLDIRECT 108.0 10/14/2012   LDLCALC 118* 02/11/2014   ALT 12 02/11/2014   AST 29 02/11/2014   NA 140 02/11/2014   K 4.6 02/11/2014   CL 104 02/11/2014   CREATININE 0.9 02/11/2014   BUN 16 02/11/2014   CO2 28 02/11/2014   TSH 2.70  02/11/2014   HGBA1C 6.7 08/08/2010    Dg Bone Density  11/09/2012   Findings : lowest T score - 2.0 @  femoral neck Diagnosis: significant Osteopenia See Recommendations        Assessment & Plan:   Problem List Items Addressed This Visit   DEPRESSION - Primary     Long hx same hanged zoloft to cymbalta tx 08/2010. on SSRI tx since 2004  Large component of SAD (winter, dark cold months) but year round chronic sx previously improved overall on cymbalta, but cost issues prompted change to generic effexor 10/2012 Initially seemed improved but recurrent sx -  trial increase Effexor 01/2014 caused side effects (dizzy, fuzzy brain) Will re-rx cymbalta now - consider other alternatives if remains cost prohibitive  declines need for counseling - no SI/HI Pt to call if symptoms worse or unimproved     Relevant Medications      DULoxetine (CYMBALTA) DR capsule

## 2014-04-06 NOTE — Patient Instructions (Signed)
It was good to see you today.  We have reviewed your prior records including labs and tests today  Medications reviewed and updated Stop generic effexor Start low dose cymbalta No other changes recommended at this time.  If improving symptoms in 30 days, let us know if dose increase to 60 mg is needed - or call sooner if problems  Please schedule followup in 3-4 months, call sooner if problems.

## 2014-04-22 ENCOUNTER — Telehealth: Payer: Self-pay | Admitting: Internal Medicine

## 2014-04-22 NOTE — Telephone Encounter (Signed)
Patient left message stating she is "terribly constipated".  Has not had BM since Sunday.  Has taken Miralax q day with no results.  What else can she do?

## 2014-04-22 NOTE — Telephone Encounter (Signed)
Pt return call back gave md response.../lmb 

## 2014-04-22 NOTE — Telephone Encounter (Signed)
Increase MiraLAX to twice daily and drink bottle of magnesium citrate (OTC) until +BM

## 2014-04-22 NOTE — Telephone Encounter (Signed)
Called pt no answer LMOM RTC.../lmb 

## 2014-05-02 ENCOUNTER — Telehealth: Payer: Self-pay | Admitting: Internal Medicine

## 2014-05-02 MED ORDER — DULOXETINE HCL 60 MG PO CPEP: 60.0000 mg | ORAL_CAPSULE | Freq: Every day | ORAL | Status: DC

## 2014-05-02 NOTE — Telephone Encounter (Signed)
Increase to 60mg  daily - new erx done thanks

## 2014-05-02 NOTE — Telephone Encounter (Signed)
Patient states that the Cymbalta 30mg  that she is taking is not working well. She wants to inquire about having it increased to Cymbalta 60mg . Please advise.

## 2014-05-02 NOTE — Telephone Encounter (Signed)
Called pt was not at home spoke with husband Courtney Hood) gave md response...Courtney Hood

## 2014-06-28 ENCOUNTER — Other Ambulatory Visit: Payer: Self-pay | Admitting: Internal Medicine

## 2014-11-21 LAB — HM MAMMOGRAPHY

## 2014-12-13 ENCOUNTER — Telehealth: Payer: Self-pay

## 2014-12-13 DIAGNOSIS — M8580 Other specified disorders of bone density and structure, unspecified site: Secondary | ICD-10-CM

## 2014-12-13 NOTE — Telephone Encounter (Signed)
Xray called and stated that pt has a bone density scheduled for Friday but there are no orders. (this is the 25th month). Orders are pended with DX please review and advise of/if there is anything else to be done.

## 2014-12-14 NOTE — Telephone Encounter (Signed)
Thank you. Order completed and signed

## 2014-12-16 ENCOUNTER — Ambulatory Visit (INDEPENDENT_AMBULATORY_CARE_PROVIDER_SITE_OTHER)
Admission: RE | Admit: 2014-12-16 | Discharge: 2014-12-16 | Disposition: A | Discharge status: Home / Self Care | Payer: Commercial Managed Care - HMO | Source: Ambulatory Visit | Attending: Internal Medicine | Admitting: Internal Medicine

## 2014-12-16 DIAGNOSIS — M8580 Other specified disorders of bone density and structure, unspecified site: Secondary | ICD-10-CM | POA: Diagnosis not present

## 2014-12-26 ENCOUNTER — Encounter: Payer: Self-pay | Admitting: Internal Medicine

## 2015-01-23 ENCOUNTER — Telehealth: Payer: Self-pay | Admitting: Internal Medicine

## 2015-01-23 NOTE — Telephone Encounter (Signed)
Patient called requesting that we increase her dosage of cymbalta. She states she is currently taking 60mg . Pt uses [facility] on [address]

## 2015-01-25 MED ORDER — DULOXETINE HCL 60 MG PO CPEP: 60.0000 mg | ORAL_CAPSULE | Freq: Two times a day (BID) | ORAL | Status: DC

## 2015-01-25 NOTE — Telephone Encounter (Signed)
Okay to increase Cymbalta to 60 mg twice daily erx done Please schedule office visit with any provider in next 6-8 weeks to review symptoms and continue titration or change of medications as needed

## 2015-01-26 NOTE — Telephone Encounter (Signed)
lvm for pt to call back.  

## 2015-01-27 NOTE — Telephone Encounter (Signed)
Pt called back.  Pt informed of MD recommendation below.

## 2015-02-14 ENCOUNTER — Ambulatory Visit (INDEPENDENT_AMBULATORY_CARE_PROVIDER_SITE_OTHER): Payer: Commercial Managed Care - HMO | Admitting: Internal Medicine

## 2015-02-14 ENCOUNTER — Encounter: Payer: Self-pay | Admitting: Internal Medicine

## 2015-02-14 ENCOUNTER — Other Ambulatory Visit (INDEPENDENT_AMBULATORY_CARE_PROVIDER_SITE_OTHER): Payer: Commercial Managed Care - HMO

## 2015-02-14 VITALS — BP 106/64 | HR 80 | Temp 98.5°F | Ht 67.0 in | Wt 128.1 lb

## 2015-02-14 DIAGNOSIS — F39 Unspecified mood [affective] disorder: Secondary | ICD-10-CM | POA: Diagnosis not present

## 2015-02-14 DIAGNOSIS — F338 Other recurrent depressive disorders: Secondary | ICD-10-CM

## 2015-02-14 LAB — T4, FREE: Free T4: 0.88 ng/dL (ref 0.60–1.60)

## 2015-02-14 LAB — TSH: TSH: 1.41 u[IU]/mL (ref 0.35–4.50)

## 2015-02-14 LAB — T3, FREE: T3, Free: 3.4 pg/mL (ref 2.3–4.2)

## 2015-02-14 MED ORDER — ARIPIPRAZOLE 5 MG PO TABS: ORAL_TABLET | ORAL | Status: DC

## 2015-02-14 NOTE — Progress Notes (Signed)
Pre visit review using our clinic review tool, if applicable. No additional management support is needed unless otherwise documented below in the visit note. 

## 2015-02-14 NOTE — Patient Instructions (Signed)
  As we discussed the neurotransmitters are essential for good brain function, both intellectually & emotionally. The agents to increase the neurotransmitter levels are not addictive and simply keep essential neurotransmitters at therapeutic levels. If these levels become severely depleted; depression or panic attacks can occur and changes in anatomy & function would be present on a PET scan of the brain. A Neurochemist (Psychiatrist ) with their knowledge of brain biochemistry could optimally manage this issue.   Your next office appointment will be determined based upon review of your pending labs   Those instructions will be transmitted to you through My Chart  Critical values will be called. Followup as needed for any active or acute issue. Please report any significant change in your symptoms.

## 2015-02-14 NOTE — Progress Notes (Signed)
   Subjective:    Patient ID: Courtney Hood, female    DOB: October 31, 1946, 69 y.o.   MRN: [id number]  HPI She describes intermittent depression since her late 45s without specific trigger. It is definitely worse in the Winter but typically improves in the Spring. This pattern has not continued this year and she's "not been out of( her) house for the past month".  The pattern over decades has been the medication such as Zoloft and Prozac work typically for year and then cease to work. She is now on Cymbalta 60 mg twice a day. This was  titrated from 30 mg daily to 30 mg twice a day and finally 60 mg twice a day.  She describes excessive sleep as a coping mechanism. She also describes anhedonia.   She had constipation but this has resolved. Hair loss is a chronic issue.  She typically has 2 alcohol beverages a month.  Both parents are alcoholics. 2 brothers are on antidepressants; she is unsure as to which medicine. Her sister attempted suicide. Her son has ADHD.  Review of Systems She describes excessive sweating on Cymbalta.  She denies any panic attacks or anorexia.    Objective:   Physical Exam  Gen.:  Well groomed.Thin but adequately nourished; in no acute distress.Appears younger than stated age Eyes: Extraocular motion intact; no lid lag , proptosis , or nystagmus Neck: full ROM; no masses ; thyroid normal  Heart: Normal rhythm and rate without significant murmur, gallop, or extra heart sounds Lungs: Chest clear to auscultation without rales,rales, wheezes Neuro:Deep tendon reflexes are equal and within normal limits; no tremor  Skin/Nails: Warm and dry without significant lesions or rashes; no onycholysis Lymphatic: no cervical or axillary LA Psych: Normally communicative and interactive; intermittently laughing. No abnormal mood or affect clinically. Clinically not depressed.        Assessment & Plan:  #1 seasonal affective disorder,recurrent &  severe   Plan: We  discussed neurotransmitter pathophysiology. I recommended the addition of Abilify until we can have assessment by a competent neurochemist [employer])

## 2015-02-15 ENCOUNTER — Telehealth: Payer: Self-pay | Admitting: Internal Medicine

## 2015-02-15 NOTE — Telephone Encounter (Signed)
I am sorry; that cost is ridiculous. Prozac would be contraindicated with the Cymbalta. Adding generic  Wellbutrin is an option until we can get subspecialty expert assessment as to best intervention.

## 2015-02-15 NOTE — Telephone Encounter (Signed)
Pt called in said that they meds that was called in for her yesterday her ins will not pay for .  It is over $300.00.  She wanted to know if he can call in prozac     [facility] [place]

## 2015-02-16 NOTE — Telephone Encounter (Signed)
Pt returned call   [phone]

## 2015-02-16 NOTE — Telephone Encounter (Signed)
Pt is wanting to stop the cymbalta (is giving hot flahes) and to go back to prozac or zoloft. Pt also stated that if you felt that the wellbutrin and cymbalta is the best choice then she would try that.   Please advise.    [facility]

## 2015-02-16 NOTE — Telephone Encounter (Signed)
To change to Sertraline ; the Cymbalta would need to be weaned. It would need to be decreased to 60 mg once daily for 5-7 days and then half a pill for 5-7 days. At that point Sertraline 50 mg to be initiated.

## 2015-02-16 NOTE — Telephone Encounter (Signed)
Bupropion 75 mg qd X 1 week then 1 bid if she agrees #60

## 2015-02-17 MED ORDER — SERTRALINE HCL 50 MG PO TABS: 50.0000 mg | ORAL_TABLET | Freq: Every day | ORAL | Status: DC

## 2015-02-17 NOTE — Telephone Encounter (Signed)
Pt return call bck gave md response. Pt agreed with change [facility].../lmb

## 2015-02-17 NOTE — Telephone Encounter (Signed)
Called pt was told by husband she was in the shower left msg for pt to RTC...Johny Chess

## 2015-03-09 ENCOUNTER — Telehealth: Payer: Self-pay | Admitting: Internal Medicine

## 2015-03-09 NOTE — Telephone Encounter (Signed)
She has chronic significant depression; please see the office note.  Perhaps you will have more influence with her or feel that an option rather than psychiatric referral is indicated. I was most concerned about the recurrence, duration and severity of symptoms. [employer]

## 2015-03-09 NOTE — Telephone Encounter (Signed)
Received call from Courtney Hood ([facility]) that she declined psychiatry referral.

## 2015-03-13 NOTE — Telephone Encounter (Signed)
Please let pt know that due to her complex history of depression adn medication issues, I agree with Hopp that formal psyc input would be best next step - arrange for refer as needed

## 2015-03-14 NOTE — Telephone Encounter (Signed)
LVM for pt to call back.

## 2015-03-15 NOTE — Telephone Encounter (Signed)
LVM for pt to call back.

## 2015-03-20 MED ORDER — PRAVASTATIN SODIUM 20 MG PO TABS: 20.0000 mg | ORAL_TABLET | Freq: Every day | ORAL | Status: DC

## 2015-03-20 NOTE — Telephone Encounter (Signed)
LVM for pt to call back.

## 2015-03-20 NOTE — Telephone Encounter (Signed)
Best number  [phone] to call pt back

## 2015-03-20 NOTE — Telephone Encounter (Signed)
Spoke to pt.   She is currently doing really well on the change of treatment given by [facility]. Pt stated that she is not interested in seeing a psychiatrist. She has a CPE scheduled for September.

## 2015-03-21 NOTE — Telephone Encounter (Signed)
Noted Thanks for message 

## 2015-03-28 ENCOUNTER — Other Ambulatory Visit: Payer: Self-pay | Admitting: Internal Medicine

## 2015-04-14 ENCOUNTER — Ambulatory Visit (INDEPENDENT_AMBULATORY_CARE_PROVIDER_SITE_OTHER)
Admission: RE | Admit: 2015-04-14 | Discharge: 2015-04-14 | Disposition: A | Discharge status: Home / Self Care | Payer: Commercial Managed Care - HMO | Source: Ambulatory Visit | Attending: Internal Medicine | Admitting: Internal Medicine

## 2015-04-14 ENCOUNTER — Ambulatory Visit (INDEPENDENT_AMBULATORY_CARE_PROVIDER_SITE_OTHER): Payer: Commercial Managed Care - HMO | Admitting: Internal Medicine

## 2015-04-14 ENCOUNTER — Encounter: Payer: Self-pay | Admitting: Internal Medicine

## 2015-04-14 VITALS — BP 90/60 | HR 64 | Temp 98.4°F | Resp 13 | Ht 67.0 in | Wt 128.2 lb

## 2015-04-14 DIAGNOSIS — M5416 Radiculopathy, lumbar region: Secondary | ICD-10-CM

## 2015-04-14 DIAGNOSIS — M4186 Other forms of scoliosis, lumbar region: Secondary | ICD-10-CM | POA: Diagnosis not present

## 2015-04-14 DIAGNOSIS — M5136 Other intervertebral disc degeneration, lumbar region: Secondary | ICD-10-CM | POA: Diagnosis not present

## 2015-04-14 MED ORDER — GABAPENTIN 100 MG PO CAPS: ORAL_CAPSULE | ORAL | Status: DC

## 2015-04-14 NOTE — Patient Instructions (Signed)
Assess response to the gabapentin one every 8 hours as needed. If it is partially beneficial, it can be increased up to a total of 3 pills every 8 hours as needed. This increase of 1 pill each dose  should take place over 72 hours at least.If 300 mg is effective dose ; there is a 300 mg pill. The best exercises for the low back include freestyle swimming, stretch aerobics, and yoga.  Your next office appointment will be determined based upon review of your pending  xrays  Those instructions will be transmitted to you by My Chart  Critical results will be called.   Followup as needed for any active or acute issue. Please report any significant change in your symptoms.

## 2015-04-14 NOTE — Progress Notes (Signed)
Pre visit review using our clinic review tool, if applicable. No additional management support is needed unless otherwise documented below in the visit note. 

## 2015-04-14 NOTE — Progress Notes (Signed)
   Subjective:    Patient ID: Courtney Hood, female    DOB: 05-31-1946, 69 y.o.   MRN: [id number]  HPI She has experienced an exacerbation of a chronic  back problem in the last 3 weeks. She describes this as a dull pain from the lumbosacral area into the R buttock which last hours with intermittent very sharp pain described as a "hot poker". There was no specific trigger/ injury for this.  Her symptoms originally began in May 2015 when she twisted to her right reaching down experiencing the initial episode of the pain.  She's been using heat and Blue Emu with benefit.   Review of Systems Fever, chills, sweats, or unexplained weight loss not present. There is no numbness, tingling, or weakness in extremities.   No loss of control of bladder or bowels. Dysuria, pyuria, hematuria, frequency, nocturia or polyuria are denied.     Objective:   Physical Exam  Pertinent or positive findings include: She is tall and thin.  There is obvious malalignment of the spine with asymmetry of the posterior thoracic musculature.  Heel and toe walking is normal.  There is no foot drop.  She has negative straight leg raising to 90.  She does exhibit somewhat of a "low back crawl" sitting up from the exam table.  General appearance :adequately nourished; in no distress. Eyes: No conjunctival inflammation or scleral icterus is present. Heart:  Normal rate and regular rhythm. S1 and S2 normal without gallop, murmur, click, rub or other extra sounds   Lungs:Chest clear to auscultation; no wheezes, rhonchi,rales ,or rubs present.No increased work of breathing.  Abdomen: bowel sounds normal, soft and non-tender without masses, organomegaly or hernias noted.  No guarding or rebound. No flank tenderness to percussion. Vascular : all pulses equal ; no bruits present. Skin:Warm & dry.  Intact without suspicious lesions or rashes ; no tenting or jaundice  Lymphatic: No lymphadenopathy is noted about the head,  neck, axilla Neuro: Strength, tone & DTRs normal.        Assessment & Plan:  #1 lumbar radiculopathy,?L2 or 3 #2 scoliosis Plan: imaging Gabapentin trial Consider MRI if symptoms progressive

## 2015-06-09 ENCOUNTER — Telehealth: Payer: Self-pay | Admitting: Internal Medicine

## 2015-06-09 NOTE — Telephone Encounter (Signed)
Called [facility] they have called pt with no return call.  °

## 2015-06-30 ENCOUNTER — Other Ambulatory Visit: Payer: Self-pay | Admitting: Emergency Medicine

## 2015-06-30 ENCOUNTER — Telehealth: Payer: Self-pay | Admitting: Internal Medicine

## 2015-06-30 MED ORDER — SERTRALINE HCL 50 MG PO TABS: 50.0000 mg | ORAL_TABLET | Freq: Every day | ORAL | Status: DC

## 2015-06-30 MED ORDER — SERTRALINE HCL 100 MG PO TABS: 100.0000 mg | ORAL_TABLET | Freq: Every day | ORAL | Status: DC

## 2015-06-30 NOTE — Telephone Encounter (Signed)
[facility] [place] [facility] [address] Patient statesthat she was told that if the sertraline (ZOLOFT) 50 MG tablet [[id number]]  Dosage of 50mg  was not working that we would increase dosage to 100. She requests that this be done.

## 2015-06-30 NOTE — Telephone Encounter (Signed)
Sertraline 100 mg #30 1 qd Needs F/U before next refill;see if Val has opening in 3-4 weeks

## 2015-06-30 NOTE — Telephone Encounter (Signed)
Zoloft 50mg  cancelled by pharm. Zoloft 100mg  sent in. Pt has a CPE with [facility] in Sept. Informed pt to keep that as a follow-up for medication change

## 2015-06-30 NOTE — Telephone Encounter (Signed)
Please advise 

## 2015-07-30 ENCOUNTER — Other Ambulatory Visit: Payer: Self-pay | Admitting: Internal Medicine

## 2015-07-31 NOTE — Telephone Encounter (Signed)
OK X1 

## 2015-07-31 NOTE — Telephone Encounter (Signed)
Last OV 04/14/15. Please advise.

## 2015-08-01 ENCOUNTER — Telehealth: Payer: Self-pay

## 2015-08-01 ENCOUNTER — Other Ambulatory Visit: Payer: Self-pay | Admitting: Emergency Medicine

## 2015-08-01 MED ORDER — SERTRALINE HCL 100 MG PO TABS: 100.0000 mg | ORAL_TABLET | Freq: Every day | ORAL | Status: DC

## 2015-08-01 NOTE — Telephone Encounter (Signed)
Call to introduce AWV; has CPE 09/13 with Dr. Asa Lente at 9:45am  LVM for the patient to call back to educate and schedule for wellness visit.

## 2015-08-04 NOTE — Telephone Encounter (Signed)
Call to set up AWV prior to apt with dr. Asa Lente 9/13 at 9:45 but when the patient call back, I now have a conflict with time; Will defer AWV to Dr. Asa Lente; . The patient asked if she can come in early the day of her apt and and have labs drawn prior to seeing Dr. Georgetta Haber;   I don't see future labs; Should I let her know she needs to see Dr. Asa Lente first?    Please advise!  Pollyann Kennedy

## 2015-08-11 NOTE — Telephone Encounter (Signed)
Spoke to pt.  Instructed pt to come at time of appt. We may be able to send her down for labs due to delay for the day. (there has been an addition to the schedule per VL).   I will enter labs for that encounter typical with CPE. (add ons can be done after visit if deemed necessary).   Spoke with pt about Hep C screening and she agrees. Decline flu vac, agree with prevnar.

## 2015-08-15 ENCOUNTER — Encounter: Payer: Self-pay | Admitting: Internal Medicine

## 2015-08-15 ENCOUNTER — Other Ambulatory Visit (INDEPENDENT_AMBULATORY_CARE_PROVIDER_SITE_OTHER): Payer: Commercial Managed Care - HMO

## 2015-08-15 ENCOUNTER — Ambulatory Visit (INDEPENDENT_AMBULATORY_CARE_PROVIDER_SITE_OTHER): Payer: Commercial Managed Care - HMO | Admitting: Internal Medicine

## 2015-08-15 VITALS — BP 108/60 | HR 62 | Temp 98.2°F | Ht 67.0 in | Wt 127.5 lb

## 2015-08-15 DIAGNOSIS — Z23 Encounter for immunization: Secondary | ICD-10-CM

## 2015-08-15 DIAGNOSIS — Z1159 Encounter for screening for other viral diseases: Secondary | ICD-10-CM

## 2015-08-15 DIAGNOSIS — Z0000 Encounter for general adult medical examination without abnormal findings: Secondary | ICD-10-CM

## 2015-08-15 DIAGNOSIS — E538 Deficiency of other specified B group vitamins: Secondary | ICD-10-CM

## 2015-08-15 DIAGNOSIS — E785 Hyperlipidemia, unspecified: Secondary | ICD-10-CM

## 2015-08-15 DIAGNOSIS — M5416 Radiculopathy, lumbar region: Secondary | ICD-10-CM

## 2015-08-15 LAB — LIPID PANEL
CHOL/HDL RATIO: 3
Cholesterol: 198 mg/dL (ref 0–200)
HDL: 69.1 mg/dL (ref >39.00)
LDL CALC: 116 mg/dL — AB (ref 0–99)
NonHDL: 128.49
TRIGLYCERIDES: 61 mg/dL (ref 0.0–149.0)
VLDL: 12.2 mg/dL (ref 0.0–40.0)

## 2015-08-15 LAB — HEPATIC FUNCTION PANEL
ALK PHOS: 59 U/L (ref 39–117)
ALT: 7 U/L (ref 0–35)
AST: 16 U/L (ref 0–37)
Albumin: 4.2 g/dL (ref 3.5–5.2)
BILIRUBIN DIRECT: 0.1 mg/dL (ref 0.0–0.3)
BILIRUBIN TOTAL: 0.4 mg/dL (ref 0.2–1.2)
Total Protein: 6.9 g/dL (ref 6.0–8.3)

## 2015-08-15 LAB — CBC WITH DIFFERENTIAL/PLATELET
BASOS ABS: 0 10*3/uL (ref 0.0–0.1)
BASOS PCT: 0.4 % (ref 0.0–3.0)
EOS ABS: 0.1 10*3/uL (ref 0.0–0.7)
Eosinophils Relative: 1.1 % (ref 0.0–5.0)
HEMATOCRIT: 44 % (ref 36.0–46.0)
HEMOGLOBIN: 14.6 g/dL (ref 12.0–15.0)
LYMPHS PCT: 31 % (ref 12.0–46.0)
Lymphs Abs: 1.8 10*3/uL (ref 0.7–4.0)
MCHC: 33.2 g/dL (ref 30.0–36.0)
MCV: 88.3 fl (ref 78.0–100.0)
MONO ABS: 0.5 10*3/uL (ref 0.1–1.0)
Monocytes Relative: 8.9 % (ref 3.0–12.0)
Neutro Abs: 3.5 10*3/uL (ref 1.4–7.7)
Neutrophils Relative %: 58.6 % (ref 43.0–77.0)
Platelets: 225 10*3/uL (ref 150.0–400.0)
RBC: 4.98 Mil/uL (ref 3.87–5.11)
RDW: 13.6 % (ref 11.5–15.5)
WBC: 5.9 10*3/uL (ref 4.0–10.5)

## 2015-08-15 LAB — BASIC METABOLIC PANEL
BUN: 23 mg/dL (ref 6–23)
CALCIUM: 10.6 mg/dL — AB (ref 8.4–10.5)
CO2: 30 mEq/L (ref 19–32)
CREATININE: 0.79 mg/dL (ref 0.40–1.20)
Chloride: 106 mEq/L (ref 96–112)
GFR: 77 mL/min (ref >60.00)
Glucose, Bld: 88 mg/dL (ref 70–99)
Potassium: 4.6 mEq/L (ref 3.5–5.1)
Sodium: 141 mEq/L (ref 135–145)

## 2015-08-15 LAB — TSH: TSH: 2.01 u[IU]/mL (ref 0.35–4.50)

## 2015-08-15 LAB — VITAMIN B12: Vitamin B-12: 316 pg/mL (ref 211–911)

## 2015-08-15 MED ORDER — PRAVASTATIN SODIUM 20 MG PO TABS: 20.0000 mg | ORAL_TABLET | Freq: Every day | ORAL | Status: DC

## 2015-08-15 MED ORDER — SERTRALINE HCL 100 MG PO TABS: 100.0000 mg | ORAL_TABLET | Freq: Every day | ORAL | Status: DC

## 2015-08-15 NOTE — Assessment & Plan Note (Signed)
Plain film May 2016 showed lumbar levoscoliosis. Periodic flares of pain along lumbar spine radiating into right side and right buttock, but not into leg. Will refer to Dr. Elisabeth Cara for further evaluation and treatment of this chronic condition (symptoms ongoing greater than one year)

## 2015-08-15 NOTE — Progress Notes (Signed)
Pre visit review using our clinic review tool, if applicable. No additional management support is needed unless otherwise documented below in the visit note. 

## 2015-08-15 NOTE — Progress Notes (Signed)
Subjective:    Patient ID: Courtney Hood, female    DOB: 03/31/1946, 69 y.o.   MRN: [id number]  HPI   Here for medicare wellness  Diet: heart healthy  Physical activity: sedentary Depression/mood screen: on tx Hearing: intact to whispered voice, but "not as good as my husband thinks I should be" Visual acuity: grossly normal, performs annual eye exam  ADLs: capable Fall risk: none Home safety: good Cognitive evaluation: intact to orientation, naming, recall and repetition EOL planning: adv directives, full code/ I agree  I have personally reviewed and have noted 1. The patient's medical and social history 2. Their use of alcohol, tobacco or illicit drugs 3. Their current medications and supplements 4. The patient's functional ability including ADL's, fall risks, home safety risks and hearing or visual impairment. 5. Diet and physical activities 6. Evidence for depression or mood disorders  Also reviewed chronic medical conditions, interval events and current concerns   Past Medical History  Diagnosis Date  . DEPRESSION   . DERMATITIS   . DYSLIPIDEMIA   . OSTEOPENIA   . Primary hyperparathyroidism    Family History  Problem Relation Age of Onset  . Colon cancer Brother 57  . Leukemia Father   . Esophageal cancer Neg Hx   . Stomach cancer Neg Hx   . Rectal cancer Neg Hx    Social History  Substance Use Topics  . Smoking status: Former Smoker    Quit date: 12/06/1999  . Smokeless tobacco: Never Used     Comment: Married, lives wih spouse. Moved to [place] area from [place] in spring 2011  . Alcohol Use: Yes     Comment: social-maybe 1 glass wine twice a month.     Review of Systems  Constitutional: Negative for fatigue and unexpected weight change.  Respiratory: Negative for cough, shortness of breath and wheezing.   Cardiovascular: Negative for chest pain, palpitations and leg swelling.  Gastrointestinal: Negative for nausea, abdominal pain and diarrhea.    Musculoskeletal: Positive for back pain (upper L spine with radiation to R side into buttock when "flared", exacerbated by twisting activity - no radiation into leg). Negative for myalgias, joint swelling and gait problem.  Neurological: Negative for dizziness, weakness, light-headedness and headaches.  Psychiatric/Behavioral: Negative for dysphoric mood. The patient is not nervous/anxious.   All other systems reviewed and are negative.   Patient Care Team: Rowe Clack, MD as PCP - General Renato Shin, MD (Endocrinology) Milus Banister, MD (Gastroenterology)     Objective:    Physical Exam  Constitutional: She is oriented to person, place, and time. She appears well-developed and well-nourished. No distress.  HENT:  Head: Normocephalic and atraumatic.  Right Ear: External ear normal.  Left Ear: External ear normal.  Nose: Nose normal.  Mouth/Throat: Oropharynx is clear and moist. No oropharyngeal exudate.  Eyes: EOM are normal. Pupils are equal, round, and reactive to light. Right eye exhibits no discharge. Left eye exhibits no discharge. No scleral icterus.  Neck: Normal range of motion. Neck supple. No JVD present. No tracheal deviation present. No thyromegaly present.  Cardiovascular: Normal rate, regular rhythm, normal heart sounds and intact distal pulses.  Exam reveals no friction rub.   No murmur heard. Pulmonary/Chest: Effort normal and breath sounds normal. No respiratory distress. She has no wheezes. She has no rales. She exhibits no tenderness.  Abdominal: Soft. Bowel sounds are normal. She exhibits no distension and no mass. There is no tenderness. There is no rebound and  no guarding.  Genitourinary:  Defer to gyn  Musculoskeletal: Normal range of motion.  No gross deformities Back: full range of motion of thoracic and lumbar spine. Non tender to palpation. Negative straight leg raise. DTR's are symmetrically intact. Sensation intact in all dermatomes of the  lower extremities. Full strength to manual muscle testing. patient is able to heel toe walk without difficulty and ambulates with antalgic gait.  Lymphadenopathy:    She has no cervical adenopathy.  Neurological: She is alert and oriented to person, place, and time. She has normal reflexes. No cranial nerve deficit.  Skin: Skin is warm and dry. No rash noted. She is not diaphoretic. No erythema.  Psychiatric: She has a normal mood and affect. Her behavior is normal. Judgment and thought content normal.  Nursing note and vitals reviewed.   BP 108/60 mmHg  Pulse 62  Temp(Src) 98.2 F (36.8 C) (Oral)  Ht 5\' 7"  (1.702 m)  Wt 127 lb 8 oz (57.834 kg)  BMI 19.96 kg/m2  SpO2 95% Wt Readings from Last 3 Encounters:  08/15/15 127 lb 8 oz (57.834 kg)  04/14/15 128 lb 4 oz (58.174 kg)  02/14/15 128 lb 0.8 oz (58.083 kg)    Lab Results  Component Value Date   WBC 6.0 02/11/2014   HGB 15.2* 02/11/2014   HCT 45.7 02/11/2014   PLT 239.0 02/11/2014   GLUCOSE 87 02/11/2014   CHOL 215* 02/11/2014   TRIG 74.0 02/11/2014   HDL 82.00 02/11/2014   LDLDIRECT 108.0 10/14/2012   LDLCALC 118* 02/11/2014   ALT 12 02/11/2014   AST 29 02/11/2014   NA 140 02/11/2014   K 4.6 02/11/2014   CL 104 02/11/2014   CREATININE 0.9 02/11/2014   BUN 16 02/11/2014   CO2 28 02/11/2014   TSH 1.41 02/14/2015   HGBA1C 6.7 08/08/2010   No results found for: VITAMINB12  Dg Lumbar Spine Complete  04/14/2015   CLINICAL DATA:  Right L2 radiculopathy.  Low back pain  EXAM: LUMBAR SPINE - COMPLETE 4+ VIEW  COMPARISON:  none  FINDINGS: Normal alignment. Mild levoscoliosis L2-3 with marked disc space narrowing and spurring on the right at L2-3. Moderate disc degeneration and spurring L4-5 and L5-S1. Mild disc degeneration at L3-4.  Negative for fracture or pars defect. No mass lesion. SI joints normal.  IMPRESSION: Lumbar levoscoliosis. Moderate disc degeneration as above. No acute bony abnormality.   Electronically Signed    By: Franchot Gallo M.D.   On: 04/14/2015 12:07       Assessment & Plan:   CPX/z00.00 - Today patient counseled on age appropriate routine health concerns for screening and prevention, each reviewed and up to date or declined. Immunizations reviewed and up to date or declined. Labs ordered and reviewed. Risk factors for depression reviewed and negative. Hearing function and visual acuity are intact. ADLs screened and addressed as needed. Functional ability and level of safety reviewed and appropriate. Education, counseling and referrals performed based on assessed risks today. Patient provided with a copy of personalized plan for preventive services.  Problem List Items Addressed This Visit    B12 deficiency    History of previous B 12 deficiency diagnosis while in [place], on monthly IM replacement in 2010, discontinued since moving to [place] in 2011 Recheck value now and resume replacement as needed      Relevant Orders   Vitamin B12   Hyperlipidemia    On prava since 2008 -  The current medical regimen is effective;  continue present plan and medications.       Relevant Medications   pravastatin (PRAVACHOL) 20 MG tablet   Right lumbar radiculopathy    Plain film May 2016 showed lumbar levoscoliosis. Periodic flares of pain along lumbar spine radiating into right side and right buttock, but not into leg. Will refer to Dr. Elisabeth Cara for further evaluation and treatment of this chronic condition (symptoms ongoing greater than one year)      Relevant Medications   sertraline (ZOLOFT) 100 MG tablet   Other Relevant Orders   Ambulatory referral to Orthopedic Surgery    Other Visit Diagnoses    Routine general medical examination at a health care facility    -  Primary    Relevant Orders    Basic metabolic panel    CBC with Differential/Platelet    Hepatic function panel    Lipid panel    TSH    Need for prophylactic vaccination and inoculation against influenza         Relevant Orders    Flu Vaccine QUAD 36+ mos IM (Completed)    Need for hepatitis C screening test        Relevant Orders    Hepatitis C antibody        Gwendolyn Grant, MD

## 2015-08-15 NOTE — Patient Instructions (Addendum)
It was good to see you today.  We have reviewed your prior records including labs and tests today  Health Maintenance reviewed - annual flu shot updated today. All otherl recommended immunizations and age-appropriate screenings are up-to-date.  Test(s) ordered today. Your results will be released to [place] (or called to you) after review, usually within 72hours after test completion. If any changes need to be made, you will be notified at that same time.  Medications reviewed and updated, no changes recommended at this time.  we'll make referral to Dr. Maia Petties for your back. Our office will contact you regarding appointment(s) once made.  Please schedule followup in 12 months for annual exam and labs, call sooner if problems.  Health Maintenance Adopting a healthy lifestyle and getting preventive care can go a long way to promote health and wellness. Talk with your health care provider about what schedule of regular examinations is right for you. This is a good chance for you to check in with your provider about disease prevention and staying healthy. In between checkups, there are plenty of things you can do on your own. Experts have done a lot of research about which lifestyle changes and preventive measures are most likely to keep you healthy. Ask your health care provider for more information. WEIGHT AND DIET  Eat a healthy diet  Be sure to include plenty of vegetables, fruits, low-fat dairy products, and lean protein.  Do not eat a lot of foods high in solid fats, added sugars, or salt.  Get regular exercise. This is one of the most important things you can do for your health.  Most adults should exercise for at least 150 minutes each week. The exercise should increase your heart rate and make you sweat (moderate-intensity exercise).  Most adults should also do strengthening exercises at least twice a week. This is in addition to the moderate-intensity exercise.  Maintain a healthy  weight  Body mass index (BMI) is a measurement that can be used to identify possible weight problems. It estimates body fat based on height and weight. Your health care provider can help determine your BMI and help you achieve or maintain a healthy weight.  For females 42 years of age and older:   A BMI below 18.5 is considered underweight.  A BMI of 18.5 to 24.9 is normal.  A BMI of 25 to 29.9 is considered overweight.  A BMI of 30 and above is considered obese.  Watch levels of cholesterol and blood lipids  You should start having your blood tested for lipids and cholesterol at 67 years of age, then have this test every 5 years.  You may need to have your cholesterol levels checked more often if:  Your lipid or cholesterol levels are high.  You are older than 67 years of age.  You are at high risk for heart disease.  CANCER SCREENING   Lung Cancer  Lung cancer screening is recommended for adults 75-57 years old who are at high risk for lung cancer because of a history of smoking.  A yearly low-dose CT scan of the lungs is recommended for people who:  Currently smoke.  Have quit within the past 15 years.  Have at least a 30-pack-year history of smoking. A pack year is smoking an average of one pack of cigarettes a day for 1 year.  Yearly screening should continue until it has been 15 years since you quit.  Yearly screening should stop if you develop a health problem  that would prevent you from having lung cancer treatment.  Breast Cancer  Practice breast self-awareness. This means understanding how your breasts normally appear and feel.  It also means doing regular breast self-exams. Let your health care provider know about any changes, no matter how small.  If you are in your 20s or 30s, you should have a clinical breast exam (CBE) by a health care provider every 1-3 years as part of a regular health exam.  If you are 35 or older, have a CBE every year. Also  consider having a breast X-ray (mammogram) every year.  If you have a family history of breast cancer, talk to your health care provider about genetic screening.  If you are at high risk for breast cancer, talk to your health care provider about having an MRI and a mammogram every year.  Breast cancer gene (BRCA) assessment is recommended for women who have family members with BRCA-related cancers. BRCA-related cancers include:  Breast.  Ovarian.  Tubal.  Peritoneal cancers.  Results of the assessment will determine the need for genetic counseling and BRCA1 and BRCA2 testing. Cervical Cancer Routine pelvic examinations to screen for cervical cancer are no longer recommended for nonpregnant women who are considered low risk for cancer of the pelvic organs (ovaries, uterus, and vagina) and who do not have symptoms. A pelvic examination may be necessary if you have symptoms including those associated with pelvic infections. Ask your health care provider if a screening pelvic exam is right for you.   The Pap test is the screening test for cervical cancer for women who are considered at risk.  If you had a hysterectomy for a problem that was not cancer or a condition that could lead to cancer, then you no longer need Pap tests.  If you are older than 65 years, and you have had normal Pap tests for the past 10 years, you no longer need to have Pap tests.  If you have had past treatment for cervical cancer or a condition that could lead to cancer, you need Pap tests and screening for cancer for at least 20 years after your treatment.  If you no longer get a Pap test, assess your risk factors if they change (such as having a new sexual partner). This can affect whether you should start being screened again.  Some women have medical problems that increase their chance of getting cervical cancer. If this is the case for you, your health care provider may recommend more frequent screening and Pap  tests.  The human papillomavirus (HPV) test is another test that may be used for cervical cancer screening. The HPV test looks for the virus that can cause cell changes in the cervix. The cells collected during the Pap test can be tested for HPV.  The HPV test can be used to screen women 30 years of age and older. Getting tested for HPV can extend the interval between normal Pap tests from three to five years.  An HPV test also should be used to screen women of any age who have unclear Pap test results.  After 67 years of age, women should have HPV testing as often as Pap tests.  Colorectal Cancer  This type of cancer can be detected and often prevented.  Routine colorectal cancer screening usually begins at 67 years of age and continues through 67 years of age.  Your health care provider may recommend screening at an earlier age if you have risk factors for colon  cancer.  Your health care provider may also recommend using home test kits to check for hidden blood in the stool.  A small camera at the end of a tube can be used to examine your colon directly (sigmoidoscopy or colonoscopy). This is done to check for the earliest forms of colorectal cancer.  Routine screening usually begins at age 8.  Direct examination of the colon should be repeated every 5-10 years through 67 years of age. However, you may need to be screened more often if early forms of precancerous polyps or small growths are found. Skin Cancer  Check your skin from head to toe regularly.  Tell your health care provider about any new moles or changes in moles, especially if there is a change in a mole's shape or color.  Also tell your health care provider if you have a mole that is larger than the size of a pencil eraser.  Always use sunscreen. Apply sunscreen liberally and repeatedly throughout the day.  Protect yourself by wearing long sleeves, pants, a wide-brimmed hat, and sunglasses whenever you are  outside. HEART DISEASE, DIABETES, AND HIGH BLOOD PRESSURE   Have your blood pressure checked at least every 1-2 years. High blood pressure causes heart disease and increases the risk of stroke.  If you are between 70 years and 84 years old, ask your health care provider if you should take aspirin to prevent strokes.  Have regular diabetes screenings. This involves taking a blood sample to check your fasting blood sugar level.  If you are at a normal weight and have a low risk for diabetes, have this test once every three years after 67 years of age.  If you are overweight and have a high risk for diabetes, consider being tested at a younger age or more often. PREVENTING INFECTION  Hepatitis B  If you have a higher risk for hepatitis B, you should be screened for this virus. You are considered at high risk for hepatitis B if:  You were born in a country where hepatitis B is common. Ask your health care provider which countries are considered high risk.  Your parents were born in a high-risk country, and you have not been immunized against hepatitis B (hepatitis B vaccine).  You have HIV or AIDS.  You use needles to inject street drugs.  You live with someone who has hepatitis B.  You have had sex with someone who has hepatitis B.  You get hemodialysis treatment.  You take certain medicines for conditions, including cancer, organ transplantation, and autoimmune conditions. Hepatitis C  Blood testing is recommended for:  Everyone born from [birth year] through 1963.  Anyone with known risk factors for hepatitis C. Sexually transmitted infections (STIs)  You should be screened for sexually transmitted infections (STIs) including gonorrhea and chlamydia if:  You are sexually active and are younger than 67 years of age.  You are older than 67 years of age and your health care provider tells you that you are at risk for this type of infection.  Your sexual activity has changed since  you were last screened and you are at an increased risk for chlamydia or gonorrhea. Ask your health care provider if you are at risk.  If you do not have HIV, but are at risk, it may be recommended that you take a prescription medicine daily to prevent HIV infection. This is called pre-exposure prophylaxis (PrEP). You are considered at risk if:  You are sexually active and do not  regularly use condoms or know the HIV status of your partner(s).  You take drugs by injection.  You are sexually active with a partner who has HIV. Talk with your health care provider about whether you are at high risk of being infected with HIV. If you choose to begin PrEP, you should first be tested for HIV. You should then be tested every 3 months for as long as you are taking PrEP.  PREGNANCY   If you are premenopausal and you may become pregnant, ask your health care provider about preconception counseling.  If you may become pregnant, take 400 to 800 micrograms (mcg) of folic acid every day.  If you want to prevent pregnancy, talk to your health care provider about birth control (contraception). OSTEOPOROSIS AND MENOPAUSE   Osteoporosis is a disease in which the bones lose minerals and strength with aging. This can result in serious bone fractures. Your risk for osteoporosis can be identified using a bone density scan.  If you are 30 years of age or older, or if you are at risk for osteoporosis and fractures, ask your health care provider if you should be screened.  Ask your health care provider whether you should take a calcium or vitamin D supplement to lower your risk for osteoporosis.  Menopause may have certain physical symptoms and risks.  Hormone replacement therapy may reduce some of these symptoms and risks. Talk to your health care provider about whether hormone replacement therapy is right for you.  HOME CARE INSTRUCTIONS   Schedule regular health, dental, and eye exams.  Stay current with  your immunizations.   Do not use any tobacco products including cigarettes, chewing tobacco, or electronic cigarettes.  If you are pregnant, do not drink alcohol.  If you are breastfeeding, limit how much and how often you drink alcohol.  Limit alcohol intake to no more than 1 drink per day for nonpregnant women. One drink equals 12 ounces of beer, 5 ounces of wine, or 1 ounces of hard liquor.  Do not use street drugs.  Do not share needles.  Ask your health care provider for help if you need support or information about quitting drugs.  Tell your health care provider if you often feel depressed.  Tell your health care provider if you have ever been abused or do not feel safe at home. Document Released: 06/03/2011 Document Revised: 04/04/2014 Document Reviewed: 10/20/2013 [facility] Patient Information 2015 [place], [place]. This information is not intended to replace advice given to you by your health care provider. Make sure you discuss any questions you have with your health care provider.

## 2015-08-15 NOTE — Assessment & Plan Note (Signed)
On prava since 2008 -  The current medical regimen is effective;  continue present plan and medications.

## 2015-08-15 NOTE — Assessment & Plan Note (Signed)
History of previous B 12 deficiency diagnosis while in [place], on monthly IM replacement in 2010, discontinued since moving to [place] in 2011 Recheck value now and resume replacement as needed

## 2015-08-15 NOTE — Assessment & Plan Note (Signed)
Long hx same changed zoloft to cymbalta tx 08/2010. on SSRI tx since 2004  Large component of SAD (winter, dark cold months) but year round chronic symptoms previously improved overall on cymbalta, but cost issues prompted change to generic effexor 10/2012 Initially seemed improved but recurrent sx -  trial increase Effexor 01/2014 caused side effects (dizzy, fuzzy brain) Will re-rx cymbalta 04/2014, but dud to ineffective control, changed back to sertraline 01/2015 - now doing well-  declines need for counseling - no SI/HI Ptcagrees to call if symptoms worse or unimproved

## 2015-08-16 LAB — HEPATITIS C ANTIBODY: HCV Ab: NEGATIVE

## 2015-09-30 ENCOUNTER — Other Ambulatory Visit: Payer: Self-pay | Admitting: Internal Medicine

## 2015-10-03 ENCOUNTER — Ambulatory Visit (INDEPENDENT_AMBULATORY_CARE_PROVIDER_SITE_OTHER): Payer: Commercial Managed Care - HMO

## 2015-10-03 DIAGNOSIS — Z23 Encounter for immunization: Secondary | ICD-10-CM

## 2015-10-05 DIAGNOSIS — M5136 Other intervertebral disc degeneration, lumbar region: Secondary | ICD-10-CM | POA: Diagnosis not present

## 2015-10-05 DIAGNOSIS — Z681 Body mass index (BMI) 19 or less, adult: Secondary | ICD-10-CM | POA: Diagnosis not present

## 2015-10-05 DIAGNOSIS — M47817 Spondylosis without myelopathy or radiculopathy, lumbosacral region: Secondary | ICD-10-CM | POA: Diagnosis not present

## 2015-10-09 ENCOUNTER — Other Ambulatory Visit: Payer: Self-pay

## 2015-10-09 MED ORDER — SERTRALINE HCL 100 MG PO TABS: 100.0000 mg | ORAL_TABLET | Freq: Every day | ORAL | Status: DC

## 2015-10-13 DIAGNOSIS — M545 Low back pain: Secondary | ICD-10-CM | POA: Diagnosis not present

## 2015-10-17 DIAGNOSIS — M545 Low back pain: Secondary | ICD-10-CM | POA: Diagnosis not present

## 2015-10-19 DIAGNOSIS — M545 Low back pain: Secondary | ICD-10-CM | POA: Diagnosis not present

## 2015-10-23 DIAGNOSIS — M545 Low back pain: Secondary | ICD-10-CM | POA: Diagnosis not present

## 2015-10-30 DIAGNOSIS — M545 Low back pain: Secondary | ICD-10-CM | POA: Diagnosis not present

## 2015-11-02 DIAGNOSIS — M545 Low back pain: Secondary | ICD-10-CM | POA: Diagnosis not present

## 2015-11-06 DIAGNOSIS — M545 Low back pain: Secondary | ICD-10-CM | POA: Diagnosis not present

## 2015-11-10 DIAGNOSIS — M545 Low back pain: Secondary | ICD-10-CM | POA: Diagnosis not present

## 2015-11-13 DIAGNOSIS — M545 Low back pain: Secondary | ICD-10-CM | POA: Diagnosis not present

## 2015-11-16 DIAGNOSIS — M47817 Spondylosis without myelopathy or radiculopathy, lumbosacral region: Secondary | ICD-10-CM | POA: Diagnosis not present

## 2015-11-16 DIAGNOSIS — M5136 Other intervertebral disc degeneration, lumbar region: Secondary | ICD-10-CM | POA: Diagnosis not present

## 2015-11-30 DIAGNOSIS — M545 Low back pain: Secondary | ICD-10-CM | POA: Diagnosis not present

## 2015-12-05 DIAGNOSIS — M545 Low back pain: Secondary | ICD-10-CM | POA: Diagnosis not present

## 2015-12-11 DIAGNOSIS — M545 Low back pain: Secondary | ICD-10-CM | POA: Diagnosis not present

## 2015-12-18 DIAGNOSIS — M545 Low back pain: Secondary | ICD-10-CM | POA: Diagnosis not present

## 2016-01-08 DIAGNOSIS — J019 Acute sinusitis, unspecified: Secondary | ICD-10-CM | POA: Diagnosis not present

## 2016-02-23 DIAGNOSIS — Z1231 Encounter for screening mammogram for malignant neoplasm of breast: Secondary | ICD-10-CM | POA: Diagnosis not present

## 2016-02-23 LAB — HM MAMMOGRAPHY

## 2016-02-28 ENCOUNTER — Encounter: Payer: Self-pay | Admitting: Internal Medicine

## 2016-12-07 DIAGNOSIS — J019 Acute sinusitis, unspecified: Secondary | ICD-10-CM | POA: Diagnosis not present

## 2016-12-17 ENCOUNTER — Telehealth: Payer: Self-pay | Admitting: Internal Medicine

## 2016-12-17 NOTE — Telephone Encounter (Signed)
Patient would like to establish care with female provider at [place] office.  Please contact to schedule appt, needs appt before 01/11/17.

## 2017-01-02 ENCOUNTER — Encounter: Payer: Self-pay | Admitting: Physician Assistant

## 2017-01-02 ENCOUNTER — Ambulatory Visit (INDEPENDENT_AMBULATORY_CARE_PROVIDER_SITE_OTHER): Payer: Commercial Managed Care - HMO | Admitting: Physician Assistant

## 2017-01-02 VITALS — BP 110/60 | HR 76 | Temp 98.1°F | Resp 18 | Ht 67.0 in | Wt 126.0 lb

## 2017-01-02 DIAGNOSIS — E538 Deficiency of other specified B group vitamins: Secondary | ICD-10-CM

## 2017-01-02 DIAGNOSIS — Z0000 Encounter for general adult medical examination without abnormal findings: Secondary | ICD-10-CM | POA: Diagnosis not present

## 2017-01-02 DIAGNOSIS — M8580 Other specified disorders of bone density and structure, unspecified site: Secondary | ICD-10-CM | POA: Diagnosis not present

## 2017-01-02 DIAGNOSIS — E7800 Pure hypercholesterolemia, unspecified: Secondary | ICD-10-CM

## 2017-01-02 DIAGNOSIS — F331 Major depressive disorder, recurrent, moderate: Secondary | ICD-10-CM

## 2017-01-02 MED ORDER — PRAVASTATIN SODIUM 20 MG PO TABS: 20.0000 mg | ORAL_TABLET | Freq: Every day | ORAL | 3 refills | Status: DC

## 2017-01-02 MED ORDER — SERTRALINE HCL 100 MG PO TABS: 100.0000 mg | ORAL_TABLET | Freq: Every day | ORAL | 3 refills | Status: DC

## 2017-01-02 MED ORDER — BUPROPION HCL ER (XL) 150 MG PO TB24: 150.0000 mg | ORAL_TABLET | Freq: Every day | ORAL | 0 refills | Status: DC

## 2017-01-02 NOTE — Assessment & Plan Note (Signed)
Stable on Pravachol 20 mg. Recheck lipids now. 01/02/17

## 2017-01-02 NOTE — Progress Notes (Signed)
Pre visit review using our clinic review tool, if applicable. No additional management support is needed unless otherwise documented below in the visit note. 

## 2017-01-02 NOTE — Assessment & Plan Note (Signed)
DEXA @ [facility] 10/2012: -2.0 femoral neck DEXA @ [facility] 12/2014: -1.9 femoral neck Repeat DEXA this month 01/02/17

## 2017-01-02 NOTE — Addendum Note (Signed)
Addended by: Erlene Quan on: 01/02/2017 04:17 PM   Modules accepted: Orders

## 2017-01-02 NOTE — Assessment & Plan Note (Addendum)
Reviewed medication history and sent patient MyChart message about possibility of adding Wellbutrin to her current medication, while decreasing Zoloft, at least during her times of seasonal depression. Patient requests to research this medication and let us know. Follow-up with me in 1 month, sooner if needed. PHQ-9 is 13 today 01/02/17

## 2017-01-02 NOTE — Progress Notes (Signed)
Subjective:    Courtney Hood is a 71 y.o. female and is here for a comprehensive physical exam.   Chief Complaint  Patient presents with  . Establish Care   Acute Concerns: Depression and Seasonal Anxiety Disorder She has been on Zoloft for 1 year, but admits that during the winter months she feels worse. She denies any current anxiety, but does state that last month, prior to the holidays she had some issues with anxiety when waking up, feeling some panic at times, this was prior to the holidays and has resolved. She endorses difficulty with motivation, sometimes cancels on friends/family to stay in. Denies SI/HI. She has a history of seeing therapist years ago and didn't feel that it was helpful. She takes her Zoloft as prescribed. On average she gets around 8-9 hours of sleep a night.  Per chart review, she has tried Cymbalta, Lexapro, Abilify, and presently Zoloft.  Chronic Issues: Osteopenia -- hx of moderate fracture risk on BMD screening, she doesn't take Calcium/Vit D regularly because of constipation Dyslipidemia - maintained on Pravachol 20 mg, fasting labs tomorrow, denies myalgias  Health Maintenance: Immunizations -- up to date Colonoscopy -- May 2015 two polyps removed, repeat in May 2020 Mammogram -- March 2017 without abnormalities, due in March 2018 PAP -- no longer gets PAPs, history of hysterectomy Bone Density -- January 2016 with moderate fracture risk, recommended due January 2018 Diet -- multiple meals throughout the day, has small snacks throughout  Exercise -- walks when the weather is nice  126 lb - weight is very stable here, has never been <130lb  Other providers/specialists: She does not have any other doctors/specialists Has reading glasses -- need eye doctor Goes to dentist - q 4 months for gum loss   Weight in (lb) to have BMI = 25: 159.3   @PROBWDOC @ Past Surgical History:  Procedure Laterality Date  . ABDOMINAL HYSTERECTOMY    .  APPENDECTOMY     Social History   Social History  . Marital status: Married    Spouse name: N/A  . Number of children: N/A  . Years of education: N/A   Social History Main Topics  . Smoking status: Former Smoker    Quit date: 12/06/1999  . Smokeless tobacco: Never Used     Comment: Married, lives wih spouse. Moved to [place] area from [place] in spring 2011  . Alcohol use Yes     Comment: social-maybe 1 glass wine twice a month.   . Drug use: No  . Sexual activity: Not Asked   Other Topics Concern  . None   Social History Narrative   Goes by [employer].   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 Colon cancer Brother 89  . Leukemia Father   . Esophageal cancer Neg Hx   . Stomach cancer Neg Hx   . Rectal cancer Neg Hx    Allergies  Allergen Reactions  . Actonel [Risedronate Sodium] Rash   Outpatient Medications Prior to Visit  Medication Sig Dispense Refill  . ibuprofen (ADVIL,MOTRIN) 200 MG tablet Take 200 mg by mouth as needed.    . pravastatin (PRAVACHOL) 20 MG tablet Take 1 tablet (20 mg total) by mouth daily. 90 tablet 3  . sertraline (ZOLOFT) 100 MG tablet Take 1 tablet (100 mg total)  by mouth daily. 90 tablet 3   No facility-administered medications prior to visit.      Review of Systems  Constitutional: Negative for malaise/fatigue.  HENT: Positive for sinus pain.   Respiratory: Negative for shortness of breath.   Cardiovascular: Negative for chest pain.  Gastrointestinal: Negative for constipation and diarrhea.  Genitourinary: Negative for dysuria.  Neurological: Positive for headaches (once or twice week). Negative for weakness.  Psychiatric/Behavioral: The patient is not nervous/anxious.        Denies SI/HI     Objective:   BP 110/60 (BP Location: Left Arm, Patient Position: Sitting, Cuff Size: Normal)   Pulse 76    Temp 98.1 F (36.7 C) (Oral)   Resp 18   Ht 5\' 7"  (1.702 m)   Wt 126 lb (57.2 kg)   SpO2 96%   BMI 19.73 kg/m  Body mass index is 19.73 kg/m.  General Appearance:  Alert, cooperative, no distress, appears stated age  Head:  Normocephalic, without obvious abnormality, atraumatic  Eyes:  PERRL, conjunctiva/corneas clear, EOM's intact, fundi  benign, both eyes       Ears:  Normal TM's and external ear canals, both ears  Nose: Nares normal, septum midline, mucosa normal, no drainage or sinus tenderness  Throat: Lips, mucosa, and tongue normal; teeth and gums normal  Neck: Supple, symmetrical, trachea midline, no adenopathy;     thyroid:  No enlargement/tenderness/nodules; no carotid bruit or JVD  Back:   Symmetric, no curvature, ROM normal, no CVA tenderness  Lungs:   Clear to auscultation bilaterally, respirations unlabored  Chest wall:  No tenderness or deformity  Heart:  Regular rate and rhythm, S1 and S2 normal, no murmur, rub   or gallop  Abdomen:   Soft, non-tender, bowel sounds active all four quadrants, no masses, no organomegaly  Extremities: Extremities normal, atraumatic, no cyanosis or edema  Pulses: 2+ and symmetric all extremities  Skin: Skin color, texture, turgor normal, no rashes or lesions  Lymph nodes: Cervical, supraclavicular, and axillary nodes normal  Neurologic: CNII-XII grossly intact. Normal strength, sensation and reflexes throughout      Assessment:     @DIAGMEDREFESH @   Plan:    Well Adult Exam: Labs ordered: Yes. Patient counseling was done. See below for items discussed. Discussed the patient's BMI.  The BMI is in the acceptable range Follow up in 4 weeks. Breast cancer screening: patient told to schedule mammogram at her convenience. Cervical cancer screening: no longer indicated for her, she does not have cervix and has had hysterectomy.   Problem List Items Addressed This Visit      Musculoskeletal and Integument   Osteopenia - Primary     DEXA @ [facility] 10/2012: -2.0 femoral neck DEXA @ [facility] 12/2014: -1.9 femoral neck Repeat DEXA this month 01/02/17       Relevant Orders   DG Bone Density     Other   Hyperlipidemia    Stable on Pravachol 20 mg. Recheck lipids now. 01/02/17       Relevant Medications   pravastatin (PRAVACHOL) 20 MG tablet   Other Relevant Orders   Comprehensive metabolic panel   Lipid panel   Major depressive disorder    Reviewed medication history and sent patient MyChart message about possibility of adding Wellbutrin to her current medication, while decreasing Zoloft, at least during her times of seasonal depression. Patient requests to research this medication and let us know. Follow-up with me in 1 month, sooner if needed.  Relevant Medications   sertraline (ZOLOFT) 100 MG tablet   Other Relevant Orders   CBC   Comprehensive metabolic panel   123456 deficiency    Hx of this in [place], previously on IM replacement in 2010, none since moved to [facility] in 2011, will re-check now. 01/02/17       Relevant Orders   Vitamin B12    Other Visit Diagnoses    Preventative health care          Patient Counseling: [x]    Nutrition: Stressed importance of moderation in sodium/caffeine intake, saturated fat and cholesterol, caloric balance, sufficient intake of fresh fruits, vegetables, fiber, calcium, iron, and 1 mg of folate supplement per day (for females capable of pregnancy).  [x]    Stressed the importance of regular exercise.   [x]    Substance Abuse: Discussed cessation/primary prevention of tobacco, alcohol, or other drug use; driving or other dangerous activities under the influence; availability of treatment for abuse.   []    Sexuality: Discussed sexually transmitted diseases, partner selection, use of condoms, avoidance of unintended pregnancy  and contraceptive alternatives. Injury prevention: Discussed safety belts, safety helmets, smoke detector, smoking near bedding or upholstery.   [x]    Dental  health: Discussed importance of regular tooth brushing, flossing, and dental visits.  [x]    Health maintenance and immunizations reviewed. Please refer to Health maintenance section.    [name] PA-C 01/02/17

## 2017-01-02 NOTE — Assessment & Plan Note (Signed)
Hx of this in [place], previously on IM replacement in 2010, none since moved to [facility] in 2011, will re-check now. 01/02/17

## 2017-01-02 NOTE — Patient Instructions (Addendum)
It was great meeting you today, welcome to [place]!  Please come in tomorrow for your labs, stop at the front desk on your way out to schedule your lab visit. Please schedule your mammogram at your convenience for March 2018.  I will message you about medication options for depression.

## 2017-01-03 ENCOUNTER — Other Ambulatory Visit: Payer: Self-pay | Admitting: Physician Assistant

## 2017-01-03 ENCOUNTER — Other Ambulatory Visit (INDEPENDENT_AMBULATORY_CARE_PROVIDER_SITE_OTHER): Payer: Commercial Managed Care - HMO

## 2017-01-03 DIAGNOSIS — F331 Major depressive disorder, recurrent, moderate: Secondary | ICD-10-CM

## 2017-01-03 DIAGNOSIS — E7800 Pure hypercholesterolemia, unspecified: Secondary | ICD-10-CM

## 2017-01-03 DIAGNOSIS — E538 Deficiency of other specified B group vitamins: Secondary | ICD-10-CM

## 2017-01-03 LAB — COMPREHENSIVE METABOLIC PANEL
ALBUMIN: 4 g/dL (ref 3.5–5.2)
ALK PHOS: 60 U/L (ref 39–117)
ALT: 5 U/L (ref 0–35)
AST: 16 U/L (ref 0–37)
BUN: 15 mg/dL (ref 6–23)
CO2: 30 mEq/L (ref 19–32)
Calcium: 10.3 mg/dL (ref 8.4–10.5)
Chloride: 106 mEq/L (ref 96–112)
Creatinine, Ser: 0.82 mg/dL (ref 0.40–1.20)
GFR: 73.46 mL/min (ref >60.00)
GLUCOSE: 95 mg/dL (ref 70–99)
POTASSIUM: 4.2 meq/L (ref 3.5–5.1)
Sodium: 140 mEq/L (ref 135–145)
TOTAL PROTEIN: 6.6 g/dL (ref 6.0–8.3)
Total Bilirubin: 0.4 mg/dL (ref 0.2–1.2)

## 2017-01-03 LAB — CBC
HEMATOCRIT: 42.9 % (ref 36.0–46.0)
HEMOGLOBIN: 14.2 g/dL (ref 12.0–15.0)
MCHC: 33.1 g/dL (ref 30.0–36.0)
MCV: 90 fl (ref 78.0–100.0)
Platelets: 217 10*3/uL (ref 150.0–400.0)
RBC: 4.76 Mil/uL (ref 3.87–5.11)
RDW: 13.7 % (ref 11.5–15.5)
WBC: 4.7 10*3/uL (ref 4.0–10.5)

## 2017-01-03 LAB — LIPID PANEL
CHOLESTEROL: 252 mg/dL — AB (ref 0–200)
HDL: 68.5 mg/dL (ref >39.00)
LDL Cholesterol: 165 mg/dL — ABNORMAL HIGH (ref 0–99)
NonHDL: 183.05
Total CHOL/HDL Ratio: 4
Triglycerides: 90 mg/dL (ref 0.0–149.0)
VLDL: 18 mg/dL (ref 0.0–40.0)

## 2017-01-03 LAB — VITAMIN B12: Vitamin B-12: 263 pg/mL (ref 211–911)

## 2017-01-03 MED ORDER — PRAVASTATIN SODIUM 40 MG PO TABS: 40.0000 mg | ORAL_TABLET | Freq: Every day | ORAL | 1 refills | Status: DC

## 2017-01-07 NOTE — Progress Notes (Signed)
Medical screening examination/treatment/procedure(s) were performed by non-physician practitioner and as supervising physician I was immediately available for consultation/collaboration. I agree with above. Sholonda Jobst, DO   

## 2017-01-09 ENCOUNTER — Other Ambulatory Visit: Payer: Self-pay | Admitting: Physician Assistant

## 2017-01-09 ENCOUNTER — Other Ambulatory Visit: Payer: Commercial Managed Care - HMO

## 2017-01-09 ENCOUNTER — Ambulatory Visit (INDEPENDENT_AMBULATORY_CARE_PROVIDER_SITE_OTHER)
Admission: RE | Admit: 2017-01-09 | Discharge: 2017-01-09 | Disposition: A | Discharge status: Home / Self Care | Payer: Commercial Managed Care - HMO | Source: Ambulatory Visit | Attending: Physician Assistant | Admitting: Physician Assistant

## 2017-01-09 ENCOUNTER — Ambulatory Visit: Admission: RE | Admit: 2017-01-09 | Payer: Commercial Managed Care - HMO | Source: Ambulatory Visit

## 2017-01-09 DIAGNOSIS — M85851 Other specified disorders of bone density and structure, right thigh: Secondary | ICD-10-CM

## 2017-01-09 DIAGNOSIS — M8580 Other specified disorders of bone density and structure, unspecified site: Secondary | ICD-10-CM

## 2017-01-14 ENCOUNTER — Encounter: Payer: Self-pay | Admitting: Physician Assistant

## 2017-01-30 ENCOUNTER — Ambulatory Visit (INDEPENDENT_AMBULATORY_CARE_PROVIDER_SITE_OTHER): Payer: Commercial Managed Care - HMO | Admitting: Physician Assistant

## 2017-01-30 ENCOUNTER — Encounter: Payer: Self-pay | Admitting: Physician Assistant

## 2017-01-30 VITALS — BP 108/62 | HR 66 | Temp 98.4°F | Ht 67.0 in | Wt 124.5 lb

## 2017-01-30 DIAGNOSIS — E210 Primary hyperparathyroidism: Secondary | ICD-10-CM | POA: Diagnosis not present

## 2017-01-30 DIAGNOSIS — F331 Major depressive disorder, recurrent, moderate: Secondary | ICD-10-CM | POA: Diagnosis not present

## 2017-01-30 DIAGNOSIS — R0789 Other chest pain: Secondary | ICD-10-CM | POA: Diagnosis not present

## 2017-01-30 DIAGNOSIS — L659 Nonscarring hair loss, unspecified: Secondary | ICD-10-CM | POA: Diagnosis not present

## 2017-01-30 LAB — T4, FREE: Free T4: 1.75 ng/dL — ABNORMAL HIGH (ref 0.60–1.60)

## 2017-01-30 LAB — MAGNESIUM: Magnesium: 2.1 mg/dL (ref 1.5–2.5)

## 2017-01-30 LAB — TSH: TSH: 2.66 u[IU]/mL (ref 0.35–4.50)

## 2017-01-30 MED ORDER — BUPROPION HCL ER (XL) 150 MG PO TB24: 150.0000 mg | ORAL_TABLET | Freq: Every day | ORAL | 0 refills | Status: DC

## 2017-01-30 NOTE — Progress Notes (Signed)
Pre visit review using our clinic review tool, if applicable. No additional management support is needed unless otherwise documented below in the visit note. 

## 2017-01-30 NOTE — Patient Instructions (Signed)
It was great seeing you. We will do some routine blood work. I think it would be best to send you to endocrinology for further evaluation.  Continue the zoloft and wellbutrin, let us know if you need any changes.  Please see [place] in 2 months to re-check your cholesterol.   If your chest tightness changes in any way, please let us know.

## 2017-01-30 NOTE — Assessment & Plan Note (Signed)
Slightly improved with Wellbutrin addition. Continue regimen of Wellbutrin 150 mg  + Zoloft 50 mg. Will not increase Wellbutrin dose secondary to feelings of intermittent anxiety. PHQ-9 is 12 today. Follow-up as needed for mood. 01/30/17

## 2017-01-30 NOTE — Progress Notes (Signed)
Subjective:    Patient ID: Courtney Hood, female    DOB: [birth year], 71 y.o.   MRN: [id number]  HPI  Courtney Hood is a 71 y/o female who presents today for follow-up.  1. Depression - at our last visit, patient was started on Wellbutrin 150mg  which was added to her 50mg  Zoloft. She states that overall, she is feeling a bit better. Denies any side effects with the medication. She is hopeful that the upcoming warmer springtime weather will help with her mood. She denies any SI/HI. PHQ today is 12. She also feels that her depression has an anxiety component. About twice a week she is noticing that when she wakes up and starts thinking about her day she develops some chest tightness and her stomach "feels like it has frogs in it" which lasts for about 8-10 min. Denies SOB, radiation of pain or chest pain.  2. Hair loss - patient states that this is contributing to her depression. She has had this ongoing for years, but has been worse over the past few months. Hair isn't coming out in clumps but she feels like her hair is "shedding" a lot more than usual. She has tried Rogaine, biotin and special shampoos but hasn't found anything that particularly helps. She does have a history of vitamin deficiencies. Hair loss is mostly in temporal areas. Her mother also had hair loss.   3. Primary hyperparathyroidism - history of hyperparathyroidism. Last seen by endocrinology in 2011, PTH at that time was 119.3 - was told that the only treatment for her PTH was surgery. Last PTH level in our system was from 2013 that was 108.5.  Lab Results  Component Value Date   PTH 108.5 (H) 10/14/2012   CALCIUM 10.3 01/03/2017   Review of Systems  See HPI  Past Medical History:  Diagnosis Date  . Osteopenia of femoral neck, bilateral   . Primary hyperparathyroidism ([place])      Social History   Social History  . Marital status: Married    Spouse name: N/A  . Number of children: N/A  . Years of education: N/A    Occupational History  . Not on file.   Social History Main Topics  . Smoking status: Former Smoker    Quit date: 12/06/1999  . Smokeless tobacco: Never Used     Comment: Married, lives wih spouse. Moved to [place] area from [place] in spring 2011  . Alcohol use Yes     Comment: social-maybe 1 glass wine twice a month.   . Drug use: No  . Sexual activity: Not on file   Other Topics Concern  . Not on file   Social History Narrative   Goes by Courtney Hood.   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 ABDOMINAL HYSTERECTOMY    . APPENDECTOMY      Family History  Problem Relation Age of Onset  . Colon cancer Brother 57  . Leukemia Father   . Esophageal cancer Neg Hx   . Stomach cancer Neg Hx   . Rectal cancer Neg Hx     Allergies  Allergen Reactions  . Actonel [Risedronate Sodium] Rash    Current Outpatient Prescriptions on File Prior to Visit  Medication Sig Dispense Refill  . ibuprofen (ADVIL,MOTRIN) 200 MG tablet Take 200 mg by  mouth as needed.    . pravastatin (PRAVACHOL) 40 MG tablet Take 1 tablet (40 mg total) by mouth daily. 90 tablet 1  . sertraline (ZOLOFT) 100 MG tablet Take 1 tablet (100 mg total) by mouth daily. (Patient taking differently: Take 50 mg by mouth daily. ) 90 tablet 3   No current facility-administered medications on file prior to visit.     BP 108/62 (BP Location: Left Arm, Patient Position: Sitting, Cuff Size: Normal)   Pulse 66   Temp 98.4 F (36.9 C) (Oral)   Ht 5\' 7"  (1.702 m)   Wt 124 lb 8 oz (56.5 kg)   SpO2 96%   BMI 19.50 kg/m       Objective:   Physical Exam  Constitutional: Vital signs are normal. She appears well-developed and well-nourished. She is cooperative.  Non-toxic appearance. She does not have a sickly appearance. She does not appear ill. No distress.  Pulmonary/Chest: Effort  normal and breath sounds normal. No accessory muscle usage. No respiratory distress.  Neurological: She is alert.  Skin: Skin is warm, dry and intact.  Psychiatric: She has a normal mood and affect. Her speech is normal and behavior is normal. Thought content normal.  Nursing note and vitals reviewed.  EKG tracing is personally reviewed.  EKG notes NSR.  No acute changes.     Assessment & Plan:   Problem List Items Addressed This Visit      Endocrine   Primary hyperparathyroidism [facility])    Endocrinology consult for second opinion and further evaluation. Labs today. Follow q 6-12 months.      Relevant Orders   T4, free   TSH   Magnesium   PTH, intact and calcium   Vitamin D 1,25 dihydroxy   Ambulatory referral to Endocrinology     Other   Major depressive disorder    Slightly improved with Wellbutrin addition. Continue regimen of Wellbutrin 150 mg  + Zoloft 50 mg. Will not increase Wellbutrin dose secondary to feelings of intermittent anxiety. PHQ-9 is 12 today. Follow-up as needed for mood. 01/30/17       Relevant Medications   buPROPion (WELLBUTRIN XL) 150 MG 24 hr tablet   Hair loss    Worsening over past several months. Labs today. Will refer to endocrinology. Consider dermatology evaluation.      Relevant Orders   T4, free   TSH   Magnesium   PTH, intact and calcium   Vitamin D 1,25 dihydroxy   Ambulatory referral to Endocrinology    Other Visit Diagnoses    Chest tightness    -  Primary EKG today without acute changes. Suspect anxiety-related. Continue to observe symptoms. If worsens, will consider decreasing/changing Wellbutrin therapy, possible cardiology eval. Advised patient to let us know if there are any changes.   Relevant Orders   EKG 12-Lead (Completed)   T4, free   TSH   Magnesium     Courtney Coke PA-C 01/30/17

## 2017-01-30 NOTE — Assessment & Plan Note (Signed)
Worsening over past several months. Labs today. Will refer to endocrinology. Consider dermatology evaluation.

## 2017-01-30 NOTE — Assessment & Plan Note (Signed)
Endocrinology consult for second opinion and further evaluation. Labs today. Follow q 6-12 months.

## 2017-01-31 LAB — PTH, INTACT AND CALCIUM
CALCIUM: 10.3 mg/dL (ref 8.6–10.4)
PTH: 60 pg/mL (ref 14–64)

## 2017-02-02 LAB — VITAMIN D 1,25 DIHYDROXY
VITAMIN D 1, 25 (OH) TOTAL: 68 pg/mL (ref 18–72)
VITAMIN D3 1, 25 (OH): 68 pg/mL

## 2017-02-11 ENCOUNTER — Ambulatory Visit: Payer: Commercial Managed Care - HMO

## 2017-02-27 ENCOUNTER — Other Ambulatory Visit: Payer: Self-pay | Admitting: Physician Assistant

## 2017-02-27 ENCOUNTER — Other Ambulatory Visit: Payer: Self-pay | Admitting: *Deleted

## 2017-02-27 DIAGNOSIS — Z1231 Encounter for screening mammogram for malignant neoplasm of breast: Secondary | ICD-10-CM | POA: Diagnosis not present

## 2017-02-27 LAB — HM MAMMOGRAPHY: HM MAMMO: NORMAL (ref 0–4)

## 2017-02-27 MED ORDER — PRAVASTATIN SODIUM 40 MG PO TABS: 40.0000 mg | ORAL_TABLET | Freq: Every day | ORAL | 1 refills | Status: DC

## 2017-03-03 ENCOUNTER — Encounter: Payer: Self-pay | Admitting: Family Medicine

## 2017-03-05 NOTE — Progress Notes (Deleted)
Courtney Hood is a 71 y.o. female here for a {New prob or follow up:31724}.  History of Present Illness:   No chief complaint on file.   HPI  PMHx, SurgHx, SocialHx, Medications, and Allergies were reviewed in the Visit Navigator and updated as appropriate.  Current Medications:   Current Outpatient Prescriptions:  .  buPROPion (WELLBUTRIN XL) 150 MG 24 hr tablet, Take 1 tablet (150 mg total) by mouth daily. Start 150 mg ER PO qam., Disp: 90 tablet, Rfl: 0 .  ibuprofen (ADVIL,MOTRIN) 200 MG tablet, Take 200 mg by mouth as needed., Disp: , Rfl:  .  pravastatin (PRAVACHOL) 40 MG tablet, Take 1 tablet (40 mg total) by mouth daily., Disp: 90 tablet, Rfl: 1 .  sertraline (ZOLOFT) 100 MG tablet, Take 1 tablet (100 mg total) by mouth daily. (Patient taking differently: Take 50 mg by mouth daily. ), Disp: 90 tablet, Rfl: 3   Review of Systems:   ROS  Vitals:   There were no vitals filed for this visit.   There is no height or weight on file to calculate BMI.  Physical Exam:   Physical Exam  Assessment and Plan:    There are no diagnoses linked to this encounter.  . Reviewed expectations re: course of current medical issues. . Discussed self-management of symptoms. . Outlined signs and symptoms indicating need for more acute intervention. . Patient verbalized understanding and all questions were answered. . See orders for this visit as documented in the electronic medical record. . Patient received an After-Visit Summary.   [name], PA-C

## 2017-03-05 NOTE — Progress Notes (Signed)
Pre visit review using our clinic review tool, if applicable. No additional management support is needed unless otherwise documented below in the visit note. 

## 2017-03-06 ENCOUNTER — Encounter: Payer: Self-pay | Admitting: Physician Assistant

## 2017-03-06 ENCOUNTER — Ambulatory Visit (INDEPENDENT_AMBULATORY_CARE_PROVIDER_SITE_OTHER): Payer: Commercial Managed Care - HMO | Admitting: Physician Assistant

## 2017-03-06 VITALS — BP 118/62 | HR 60 | Temp 97.9°F | Ht 67.0 in | Wt 126.0 lb

## 2017-03-06 DIAGNOSIS — S4992XA Unspecified injury of left shoulder and upper arm, initial encounter: Secondary | ICD-10-CM | POA: Diagnosis not present

## 2017-03-06 NOTE — Progress Notes (Signed)
Courtney Hood is a 71 y.o. female here for left shoulder pain.  I acted as a Education administrator for [employer], PA-C Anselmo Pickler, LPN  History of Present Illness:   Chief Complaint  Patient presents with  . Left shoulder pain    x 12 days, lifted grandson out of tub, pain radiates top of shoulder to bicep area    HPI   Patient reports that 12 days ago she was lifting her grandson out of the tub and she developed pain and tenderness to her shoulder the next day. The area is slightly swollen compared to the other arm. She has not tried any consistent medications that have helped with her symptoms, has taken ibuprofen and tylenol a few times, but not consistently. She has used ice a few times. She is here today because the pain has not improved and she feels like she cannot use her arm.  PMHx, SurgHx, SocialHx, Medications, and Allergies were reviewed in the Visit Navigator and updated as appropriate.  Current Medications:   Current Outpatient Prescriptions:  .  buPROPion (WELLBUTRIN XL) 150 MG 24 hr tablet, Take 1 tablet (150 mg total) by mouth daily. Start 150 mg ER PO qam. (Patient taking differently: Take 150 mg by mouth daily as needed. Start 150 mg ER PO qam.), Disp: 90 tablet, Rfl: 0 .  ibuprofen (ADVIL,MOTRIN) 200 MG tablet, Take 200 mg by mouth as needed., Disp: , Rfl:  .  pravastatin (PRAVACHOL) 40 MG tablet, Take 1 tablet (40 mg total) by mouth daily., Disp: 90 tablet, Rfl: 1 .  sertraline (ZOLOFT) 100 MG tablet, Take 1 tablet (100 mg total) by mouth daily. (Patient taking differently: Take 50 mg by mouth daily. ), Disp: 90 tablet, Rfl: 3   Review of Systems:   Review of Systems  Constitutional: Negative.   HENT: Negative.   Eyes: Negative.   Respiratory: Negative.   Cardiovascular: Negative.   Gastrointestinal: Negative.   Genitourinary: Negative.   Musculoskeletal: Positive for joint pain and myalgias.       Left shoulder   Skin: Negative.   Neurological: Negative.    Endo/Heme/Allergies: Negative.   Psychiatric/Behavioral:       Pt states "doing better"    Vitals:   Vitals:   03/06/17 1541  BP: 118/62  Pulse: 60  Temp: 97.9 F (36.6 C)  TempSrc: Oral  SpO2: 96%  Weight: 126 lb (57.2 kg)  Height: 5\' 7"  (1.702 m)     Body mass index is 19.73 kg/m.  Physical Exam:   Physical Exam  Constitutional: She appears well-developed. She is cooperative.  Non-toxic appearance. She does not have a sickly appearance. She does not appear ill. No distress.  Cardiovascular: Normal rate, regular rhythm, S1 normal, S2 normal, normal heart sounds and normal pulses.   No LE edema  Pulmonary/Chest: Effort normal and breath sounds normal.  Musculoskeletal:       Arms: Neurological: She is alert.  Nursing note and vitals reviewed.     Assessment and Plan:    [name] was seen today for left shoulder pain.  Diagnoses and all orders for this visit:  Injury of left shoulder, initial encounter   Suspect bicep strain. Our office is currently out of slings, I discussed with patient that I would like for her to buy a sling to wear until she can be seen by Dr. Teresa Coombs. I would like for her to see him within 1 week. Alternate Tylenol and Ibuprofen. Continue to use ice.   Marland Kitchen  Reviewed expectations re: course of current medical issues. . Discussed self-management of symptoms. . Outlined signs and symptoms indicating need for more acute intervention. . Patient verbalized understanding and all questions were answered. . See orders for this visit as documented in the electronic medical record. . Patient received an After-Visit Summary.  CMA or LPN served as scribe during this visit. History, Physical, and Plan performed by medical provider. Documentation and orders reviewed and attested to.  [name], PA-C

## 2017-03-06 NOTE — Patient Instructions (Addendum)
It was great seeing you today!  Alternate tylenol and ibuprofen. Ice is your friend!  Schedule an appointment with Dr. Teresa Coombs within 1 week for evaluation of your shoulder.  Schedule an appointment with Cassie for an annual wellness visit if you haven't already.

## 2017-03-07 ENCOUNTER — Ambulatory Visit (INDEPENDENT_AMBULATORY_CARE_PROVIDER_SITE_OTHER): Payer: Commercial Managed Care - HMO | Admitting: Sports Medicine

## 2017-03-07 ENCOUNTER — Ambulatory Visit: Payer: Self-pay

## 2017-03-07 VITALS — BP 110/60 | HR 86 | Ht 67.0 in | Wt 126.8 lb

## 2017-03-07 DIAGNOSIS — S4992XA Unspecified injury of left shoulder and upper arm, initial encounter: Secondary | ICD-10-CM

## 2017-03-07 DIAGNOSIS — IMO0001 Reserved for inherently not codable concepts without codable children: Secondary | ICD-10-CM | POA: Insufficient documentation

## 2017-03-07 DIAGNOSIS — S46812A Strain of other muscles, fascia and tendons at shoulder and upper arm level, left arm, initial encounter: Secondary | ICD-10-CM | POA: Diagnosis not present

## 2017-03-07 MED ORDER — NITROGLYCERIN 0.2 MG/HR TD PT24: MEDICATED_PATCH | TRANSDERMAL | 1 refills | Status: DC

## 2017-03-07 NOTE — Progress Notes (Signed)
OFFICE VISIT NOTE Juanda Bond. Caralee Morea, [facility]  Samarie Pinder - 71 y.o. female MRN [id number]  Date of birth: 1946-10-31  Visit Date: 03/07/2017  PCP: [name], PA   Referred by: [name], [place]  SUBJECTIVE:   Chief Complaint  Patient presents with  . Left Shoulder Pain    Onset x13 days ago. Initally started when she picked up her grandson. Constant dull pain with intermittent sharp pains with movements. She has applied ice to the area with some relief. She has been taking Ibuprofen with relief. Denies any previous surgerys or injuries. Pt is RT handed.   HPI: As above. Additional pertinent information includes:  HPI ROS: ROS  Otherwise per HPI.  HISTORY & PERTINENT PRIOR DATA:  ** DO PQ-9 EVERY VISIT** She reports that she quit smoking about 17 years ago. She has never used smokeless tobacco. No results for input(s): HGBA1C, LABURIC in the last 8760 hours. Medications & Allergies reviewed per EMR Patient Active Problem List   Diagnosis Date Noted  . Supraspinatus tendon tear, left, initial encounter 03/07/2017  . Hair loss 01/30/2017  . B12 deficiency 08/15/2015  . Right lumbar radiculopathy 04/14/2015  . Primary hyperparathyroidism ([place]) 08/27/2010  . Hyperlipidemia 07/25/2010  . Major depressive disorder 07/25/2010  . Osteopenia of femoral neck, bilateral 07/25/2010   Past Medical History:  Diagnosis Date  . Osteopenia of femoral neck, bilateral   . Primary hyperparathyroidism ([place])    Family History  Problem Relation Age of Onset  . Colon cancer Brother 70  . Leukemia Father   . Esophageal cancer Neg Hx   . Stomach cancer Neg Hx   . Rectal cancer Neg Hx    Past Surgical History:  Procedure Laterality Date  . ABDOMINAL HYSTERECTOMY    . APPENDECTOMY     Social History   Occupational History  . Not on file.   Social History Main Topics  . Smoking status: Former Smoker    Quit date: 12/06/1999  . Smokeless tobacco: Never Used     Comment: Married, lives wih spouse. Moved to [place] area from [place] in spring 2011  . Alcohol use Yes     Comment: social-maybe 1 glass wine twice a month.   . Drug use: No  . Sexual activity: Not on file    OBJECTIVE:  VS:  HT:5\' 7"  (170.2 cm)   WT:126 lb 12.8 oz (57.5 kg)  BMI:19.9    BP:110/60  HR:86bpm  TEMP: ( )  RESP:97 % Physical Exam  IMAGING & PROCEDURES: No results found. Findings:  WDWN, NAD, Non-toxic appearing Alert & appropriately interactive Not depressed or anxious appearing No increased work of breathing. Pupils are equal. EOM intact without nystagmus No clubbing or cyanosis of the extremities appreciated No significant rashes/lesions/ulcerations overlying the examined area. Radial pulses 2+/4.  No significant generalized UE edema. Sensation intact to light touch in upper and lower extremities.  Left Shoulder:  Normal alignment. No significant deformity.  Non tender over clavical, AC Joint.  Generalized TTP over the anterior generalized shoulder.  No focal TTP. Limited internal rotation to L5. Slight weakness with can testing as well as with external rotation minimal. Minimal pain with Hawkins and Neer's present. No pain with brachial plexus squeeze, arm squeeze test for Spurling's/Lhermitte's compression test.   ++++++++++++++++++++++++++++++++++++++++++++++++++++++++++++++++++ LIMITED MSK ULTRASOUND OF left shoulder Images were obtained and interpreted by myself, Teresa Coombs, DO  Images have been saved and stored to  PACS system. Images obtained on: GE S7 Ultrasound machine  FINDINGS:  Biceps Tendon: Normal Pec Major Insertion: Normal Subscapularis Tendon: Normal Supraspinatus Tendon: Interstitial tearing without significant retraction of the anterior fibers.  Only mild interstitial neovascularity Infraspinatus/Teres Minor Tendon: Normal AC Joint: Normal JOINT: No significant GH  spurring appreciated LABRUM: Not evaluated   IMPRESSION:  Intrasubstance supraspinatus tear without full-thickness    ASSESSMENT & PLAN:  Visit Diagnoses:  1. Injury of left shoulder, initial encounter   2. Supraspinatus tendon tear, left, initial encounter    Meds:  Meds ordered this encounter  Medications  . nitroGLYCERIN (NITRODUR - DOSED IN MG/24 HR) 0.2 mg/hr patch    Sig: Place 1/4 of patch over affected region. Remove and replace once daily.  Slightly alter skin placement daily    Dispense:  30 patch    Refill:  1    Okay to cut patch.    Orders:  Orders Placed This Encounter  Procedures  . [place] LIMITED JOINT SPACE STRUCTURES UP LEFT(NO LINKED CHARGES)    Follow-up: Return in about 6 weeks (around 04/18/2017) for repeat diagnostic ultrasound.   Otherwise please see problem oriented charting as below.

## 2017-03-07 NOTE — Patient Instructions (Signed)

## 2017-03-07 NOTE — Assessment & Plan Note (Addendum)
Nitroglycerin protocol. Exercises provided per handout. 6 week recheck with repeat MSK ultrasound.  +++++++++++++++++++++++++++++++++++++++++++++++++++++++++++++++ PROCEDURE NOTE: THERAPEUTIC EXERCISES (97110) 15 minutes spent for Therapeutic exercises as stated in above notes.  This included exercises focusing on stretching, strengthening, with significant focus on eccentric aspects.   Proper technique shown and discussed handout in great detail with ATC.  All questions were discussed and answered.

## 2017-03-13 ENCOUNTER — Encounter: Payer: Self-pay | Admitting: Internal Medicine

## 2017-03-13 ENCOUNTER — Ambulatory Visit (INDEPENDENT_AMBULATORY_CARE_PROVIDER_SITE_OTHER): Payer: Commercial Managed Care - HMO | Admitting: Internal Medicine

## 2017-03-13 VITALS — BP 110/70 | HR 73 | Ht 67.0 in | Wt 125.0 lb

## 2017-03-13 DIAGNOSIS — E213 Hyperparathyroidism, unspecified: Secondary | ICD-10-CM | POA: Diagnosis not present

## 2017-03-13 DIAGNOSIS — E559 Vitamin D deficiency, unspecified: Secondary | ICD-10-CM | POA: Diagnosis not present

## 2017-03-13 LAB — VITAMIN D 25 HYDROXY (VIT D DEFICIENCY, FRACTURES): VITD: 20.51 ng/mL — ABNORMAL LOW (ref 30.00–100.00)

## 2017-03-13 NOTE — Patient Instructions (Addendum)
Please stop at the lab.  Discuss with your PCP about B12 supplementation.  Return to see me as needed. Please ask your PCP to check a calcium and 25 hydroxyvitamin D yearly.

## 2017-03-13 NOTE — Progress Notes (Signed)
Patient ID: Courtney Hood, female   DOB: 02-Nov-1946, 71 y.o.   MRN: [id number]    HPI  Courtney Hood is a 71 y.o.-year-old female, referred by her PCP, [name], PA, for evaluation for hypercalcemia/hyperparathyroidism.  Pt was dx with hyperparathyroidism/hypercalcemia in ~2011. At that time, she saw  Dr. Loanne Drilling who reassured her that her hyperparathyroidism was mild, and no treatment was necessary. Her PTH levels were followed up until 2013 and they remained elevated. Calcium levels fluctuated at or slightly above the upper limit of normal.  She recently changed PCPs and was evaluated by [name], PA for hair loss. As part of this investigation, she had another calcium and PTH levels checked. These were normal. However, she was referred to see me to see if there is anything else that we need to do for the hyperparathyroidism.  I reviewed pt's pertinent labs: Lab Results  Component Value Date   PTH 60 01/30/2017   PTH 108.5 (H) 10/14/2012   PTH 102.5 (H) 04/22/2012   PTH 105.9 (H) 10/18/2011   PTH 54.9 05/16/2011   PTH 119.3 (H) 08/23/2010   CALCIUM 10.3 01/30/2017   CALCIUM 10.3 01/03/2017   CALCIUM 10.6 (H) 08/15/2015   CALCIUM 10.5 02/11/2014   CALCIUM 10.5 06/17/2013   CALCIUM 10.6 (H) 10/14/2012   CALCIUM 11.1 (H) 10/14/2012   CALCIUM 10.9 (H) 04/22/2012   CALCIUM 10.7 (H) 10/18/2011   CALCIUM 10.4 10/18/2011   I reviewed pt's DEXA scans >> osteopenia: Date L1-L4 T score FN T score  01/09/2017 -0.9 RFN: -2.1 LFN: -2.1  No fractures or falls.   No h/o kidney stones.  No h/o CKD. Last BUN/Cr: Lab Results  Component Value Date   BUN 15 01/03/2017   BUN 23 08/15/2015   CREATININE 0.82 01/03/2017   CREATININE 0.79 08/15/2015   Pt is not on HCTZ.  No h/o vitamin D deficiency. No recent vit D levels, though. No results found for: VD25OH   A calcitriol level was normal: 01/30/2017: 68  Mg also normal:  01/30/2017: 2.1  Pt is not taking calcium  and vitamin D; she eats dairy (cheese) and green, leafy, vegetables.  Pt does not have a FH of hypercalcemia, pituitary tumors, thyroid cancer, or osteoporosis.   Pt. also has a history of HL and rotator cuff sd.   ROS: Constitutional: no weight gain/loss, no fatigue, + hot flushes Eyes: no blurry vision, no xerophthalmia ENT: no sore throat, no nodules palpated in throat, no dysphagia/odynophagia, no hoarseness, + hypoacusis Cardiovascular: no CP/SOB/palpitations/leg swelling Respiratory: no cough/SOB Gastrointestinal: no N/V/D/C Musculoskeletal: + muscle/no joint aches Skin: no rashes, + hair loss Neurological: no tremors/numbness/tingling/dizziness Psychiatric: + depression/no anxiety + low libido  Past Medical History:  Diagnosis Date  . Osteopenia of femoral neck, bilateral   . Primary hyperparathyroidism ([place])    Past Surgical History:  Procedure Laterality Date  . ABDOMINAL HYSTERECTOMY    . APPENDECTOMY     Social History   Social History  . Marital status: Married    Spouse name: N/A  . Number of children: 2   Social History Main Topics  . Smoking status: Former Smoker    Quit date: 2000  . Smokeless tobacco: Never Used     Comment: Married, lives wih spouse. Moved to [place] area from [place] in spring 2011  . Alcohol use Yes     Comment: social-maybe 1 glass wine once a week  . Drug use: No   Social History Narrative   Goes by  [name].   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 buPROPion (WELLBUTRIN XL) 150 MG 24 hr tablet Take 1 tablet (150 mg total) by mouth daily. Start 150 mg ER PO qam. 90 tablet 0  . ibuprofen (ADVIL,MOTRIN) 200 MG tablet Take 200 mg by mouth as needed.    . nitroGLYCERIN (NITRODUR - DOSED IN MG/24 HR) 0.2 mg/hr patch Place 1/4 of patch over affected  region. Remove and replace once daily.  Slightly alter skin placement daily 30 patch 1  . pravastatin (PRAVACHOL) 40 MG tablet Take 1 tablet (40 mg total) by mouth daily. 90 tablet 1  . sertraline (ZOLOFT) 100 MG tablet Take 1 tablet (100 mg total) by mouth daily. (Patient taking differently: Take 50 mg by mouth daily. ) 90 tablet 3   No current facility-administered medications on file prior to visit.    Allergies  Allergen Reactions  . Actonel [Risedronate Sodium] Rash   Family History  Problem Relation Age of Onset  . Colon cancer Brother 47  . Leukemia Father   . Esophageal cancer Neg Hx   . Stomach cancer Neg Hx   . Rectal cancer Neg Hx    PE: BP 110/70 (BP Location: Left Arm, Patient Position: Sitting)   Pulse 73   Ht 5\' 7"  (1.702 m)   Wt 125 lb (56.7 kg)   SpO2 95%   BMI 19.58 kg/m  Wt Readings from Last 3 Encounters:  03/13/17 125 lb (56.7 kg)  03/07/17 126 lb 12.8 oz (57.5 kg)  03/06/17 126 lb (57.2 kg)   Constitutional: normal weight, in NAD. Eyes: PERRLA, EOMI, no exophthalmos ENT: moist mucous membranes, no thyromegaly, no cervical lymphadenopathy Cardiovascular: RRR, No MRG Respiratory: CTA B Gastrointestinal: abdomen soft, NT, ND, BS+ Musculoskeletal: no deformities, strength intact in all 4 Skin: moist, warm, no rashes Neurological: no tremor with outstretched hands, DTR normal in all 4  Assessment: 1. Hypercalcemia/hyperparathyroidism  2. Hair loss  Plan: 1. Patient has had several instances of elevated calcium, with the highest level being at 11.1. Intact PTH levels were also high - highest at 119.3 (corresponding calcium 10.8). - There is no information in the chart about possible vitamin D deficiency - No apparent complications from hypercalcemia: no h/o nephrolithiasis, no osteoporosis (but has osteopenia), no fractures. No abdominal pain, bone pain. - I discussed with the patient about the physiology of calcium and parathyroid hormone, however,  now that her calcium and PTH levels are normal, no intervention is needed - I recommended that she has a calcium and vitamin D checked annually by PCP and return to see me as needed if they become abnormal again - Today I would like to check a 25 HO vitamin D level - I advised the patient to try to get approximately 1000 - 1200 mg calcium from the diet. I will advise her about vitamin D supplement dose when the results of the vitamin D level are back. - I will see the patient back as needed  2. Hair loss - we reviewed her recent lab results >> vitamin B12 is low >> I suggested im B12 for at least 6 mo >> she will d/w PCP  Component     Latest Ref Rng & Units 03/13/2017  VITD  30.00 - 100.00 ng/mL 20.51 (L)   Low vitamin D >> would suggest to start 4000 units vitamin D daily and come back her or in PCP's office for a 25 HO vitamin D level in 2 months. Philemon Kingdom, MD PhD [facility]

## 2017-03-17 ENCOUNTER — Telehealth: Payer: Self-pay | Admitting: Physician Assistant

## 2017-03-17 ENCOUNTER — Other Ambulatory Visit: Payer: Self-pay | Admitting: Physician Assistant

## 2017-03-17 MED ORDER — CHOLECALCIFEROL 100 MCG (4000 UT) PO CAPS: ORAL_CAPSULE | ORAL | 0 refills | Status: DC

## 2017-03-17 NOTE — Telephone Encounter (Signed)
Please call patient and let her know that I reviewed the notes from the endocrinologist. Based on her recommendation, patient should start on monthly IM B12 injections x 6 months. Please schedule the first of these with her. Additionally, she should be taking 4000 units vit D daily. We can re-check her Vit D level in 2 months. Let's try to go ahead and schedule this lab re-draw for her so we don't forget.

## 2017-03-17 NOTE — Telephone Encounter (Signed)
Patient saw other Provider as you reccommended and wanted to know what to do next. Informed her I would have you look and advise what to schedule from there.

## 2017-03-17 NOTE — Addendum Note (Signed)
Addended by: Elio Forget on: 03/17/2017 01:22 PM   Modules accepted: Orders

## 2017-03-17 NOTE — Telephone Encounter (Signed)
Pt is aware of results. I have her scheduled for the first b12 injection and her 2 month follow up vit d lab. Medication list is UTD.

## 2017-03-20 ENCOUNTER — Other Ambulatory Visit (INDEPENDENT_AMBULATORY_CARE_PROVIDER_SITE_OTHER): Payer: Commercial Managed Care - HMO | Admitting: *Deleted

## 2017-03-20 DIAGNOSIS — E538 Deficiency of other specified B group vitamins: Secondary | ICD-10-CM | POA: Diagnosis not present

## 2017-03-20 MED ORDER — CYANOCOBALAMIN 1000 MCG/ML IJ SOLN
1000.0000 ug | Freq: Once | INTRAMUSCULAR | Status: AC
  Administered 2017-03-20: 1000 ug via INTRAMUSCULAR

## 2017-03-20 NOTE — Progress Notes (Signed)
Pt here today for 1st Vit B12 injection. Pt tolerated well and knows to return in one month for next injecton.

## 2017-03-27 ENCOUNTER — Telehealth: Payer: Self-pay | Admitting: Physician Assistant

## 2017-03-27 NOTE — Telephone Encounter (Signed)
Error

## 2017-03-31 ENCOUNTER — Other Ambulatory Visit: Payer: Self-pay | Admitting: Physician Assistant

## 2017-03-31 ENCOUNTER — Telehealth: Payer: Self-pay | Admitting: *Deleted

## 2017-03-31 ENCOUNTER — Telehealth: Payer: Self-pay

## 2017-03-31 DIAGNOSIS — E7800 Pure hypercholesterolemia, unspecified: Secondary | ICD-10-CM

## 2017-03-31 NOTE — Telephone Encounter (Signed)
I have spoken with Butch Penny about calling patient to determine what labs need to be drawn; based on our records, repeat Vit D and lipid panel need to be drawn in June. Will follow-up with patient and determine what her needs are.

## 2017-03-31 NOTE — Telephone Encounter (Signed)
Left message on voicemail to call office. Need to cancel lab appt for May 3rd too early for labs. Can keep lab appt 6/18 and do both labs then, Cholesterol and Vit D check.

## 2017-03-31 NOTE — Telephone Encounter (Signed)
Pt called back, told her it is too early to check her Vit D level when she comes in on 5/3 so we are going to cancel that appt and she can have Cholesterol and Vit D check together on 6/18 appt but need to be fasting. Asked pt if she would like an earlier appt? Pt verbalized understanding and said yes. Told her okay how about 6/18 at 9:00 AM. Pt said the is fine. Okay appt scheduled for 6/18 at 9:00 AM for lab appt only. Pt verbalized understanding.

## 2017-03-31 NOTE — Telephone Encounter (Signed)
Pt coming for repeat labs on 04/03/17. Please place future order. Thank you.

## 2017-04-03 ENCOUNTER — Other Ambulatory Visit: Payer: Commercial Managed Care - HMO

## 2017-04-10 ENCOUNTER — Encounter: Payer: Self-pay | Admitting: Sports Medicine

## 2017-04-10 ENCOUNTER — Encounter: Payer: Self-pay | Admitting: Physician Assistant

## 2017-04-10 ENCOUNTER — Ambulatory Visit (INDEPENDENT_AMBULATORY_CARE_PROVIDER_SITE_OTHER): Payer: Commercial Managed Care - HMO | Admitting: Physician Assistant

## 2017-04-10 VITALS — BP 120/80 | HR 72 | Ht 67.0 in | Wt 125.0 lb

## 2017-04-10 DIAGNOSIS — R21 Rash and other nonspecific skin eruption: Secondary | ICD-10-CM | POA: Diagnosis not present

## 2017-04-10 DIAGNOSIS — L237 Allergic contact dermatitis due to plants, except food: Secondary | ICD-10-CM

## 2017-04-10 MED ORDER — METHYLPREDNISOLONE ACETATE 40 MG/ML IJ SUSP
40.0000 mg | Freq: Once | INTRAMUSCULAR | Status: AC
  Administered 2017-04-10: 40 mg via INTRAMUSCULAR

## 2017-04-10 NOTE — Patient Instructions (Signed)
It was great seeing you today!  Recommend starting an antihistamine daily, Benadryl, Claritin, Allegra, Zyrtec. Benadryl may be the most sedating. Consider others if this is the case. If you have any issues with breathing difficulties or shortness of breath I want you to go to the emergency room.  Consider using ice packs to help with itching. Avoid itching if at all possible.   Poison Ivy Dermatitis Poison ivy dermatitis is inflammation of the skin that is caused by the allergens on the leaves of the poison ivy plant. The skin reaction often involves redness, swelling, blisters, and extreme itching. What are the causes? This condition is caused by a specific chemical (urushiol) found in the sap of the poison ivy plant. This chemical is sticky and can be easily spread to people, animals, and objects. You can get poison ivy dermatitis by:  Having direct contact with a poison ivy plant.  Touching animals, other people, or objects that have come in contact with poison ivy and have the chemical on them. What increases the risk? This condition is more likely to develop in:  People who are outdoors often.  People who go outdoors without wearing protective clothing, such as closed shoes, long pants, and a long-sleeved shirt. What are the signs or symptoms? Symptoms of this condition include:  Redness and itching.  A rash that often includes bumps and blisters. The rash usually appears 48 hours after exposure.  Swelling. This may occur if the reaction is more severe. Symptoms usually last for 1-2 weeks. However, the first time you develop this condition, symptoms may last 3-4 weeks. How is this diagnosed? This condition may be diagnosed based on your symptoms and a physical exam. Your health care provider may also ask you about any recent outdoor activity. How is this treated? Treatment for this condition will vary depending on how severe it is. Treatment may include:  Hydrocortisone  creams or calamine lotions to relieve itching.  Oatmeal baths to soothe the skin.  Over-the-counter antihistamine tablets.  Oral steroid medicine for more severe outbreaks. Follow these instructions at home:  Take or apply over-the-counter and prescription medicines only as told by your health care provider.  Wash exposed skin as soon as possible with soap and cold water.  Use hydrocortisone creams or calamine lotion as needed to soothe the skin and relieve itching.  Take oatmeal baths as needed. Use colloidal oatmeal. You can get this at your local pharmacy or grocery store. Follow the instructions on the packaging.  Do not scratch or rub your skin.  While you have the rash, wash clothes right after you wear them. How is this prevented?  Learn to identify the poison ivy plant and avoid contact with the plant. This plant can be recognized by the number of leaves. Generally, poison ivy has three leaves with flowering branches on a single stem. The leaves are typically glossy, and they have jagged edges that come to a point at the front.  If you have been exposed to poison ivy, thoroughly wash with soap and water right away. You have about 30 minutes to remove the plant resin before it will cause the rash. Be sure to wash under your fingernails because any plant resin there will continue to spread the rash.  When hiking or camping, wear clothes that will help you to avoid exposure on the skin. This includes long pants, a long-sleeved shirt, tall socks, and hiking boots. You can also apply preventive lotion to your skin to help  limit exposure.  If you suspect that your clothes or outdoor gear came in contact with poison ivy, rinse them off outside with a garden hose before you bring them inside your house. Contact a health care provider if:  You have open sores in the rash area.  You have more redness, swelling, or pain in the affected area.  You have redness that spreads beyond the  rash area.  You have fluid, blood, or pus coming from the affected area.  You have a fever.  You have a rash over a large area of your body.  You have a rash on your eyes, mouth, or genitals.  Your rash does not improve after a few days. Get help right away if:  Your face swells or your eyes swell shut.  You have trouble breathing.  You have trouble swallowing. This information is not intended to replace advice given to you by your health care provider. Make sure you discuss any questions you have with your health care provider. Document Released: 11/15/2000 Document Revised: 04/25/2016 Document Reviewed: 04/26/2015 Elsevier Interactive Patient Education  2017 [employer].

## 2017-04-10 NOTE — Progress Notes (Signed)
Courtney Hood is a 71 y.o. female here for a new problem.  Autumn Leonides Schanz, cma is acting as a Education administrator for [employer], [place].  History of Present Illness:   Chief Complaint  Patient presents with  . Rash    Rash  This is a new (Patient was exposed to poison ivy on Saturday while working in her yard) problem. The current episode started in the past 7 days. The problem has been gradually worsening since onset. The affected locations include the right upper leg, right hip, right hand, left hand, neck, face and abdomen. The rash is characterized by itchiness, redness and blistering. She was exposed to plant contact. Pertinent negatives include no shortness of breath. Past treatments include anti-itch cream. The treatment provided no relief.   She denies issues with airway, shortness of breath or any difficulty breathing. She has had exposures to poison ivy in the past that required steroid injections.  PMHx, SurgHx, SocialHx, Medications, and Allergies were reviewed in the Visit Navigator and updated as appropriate.  Current Medications:   Current Outpatient Prescriptions:  .  buPROPion (WELLBUTRIN XL) 150 MG 24 hr tablet, Take 1 tablet (150 mg total) by mouth daily. Start 150 mg ER PO qam., Disp: 90 tablet, Rfl: 0 .  Cholecalciferol 4000 units CAPS, Take one tablet daily., Disp: 30 capsule, Rfl: 0 .  ibuprofen (ADVIL,MOTRIN) 200 MG tablet, Take 200 mg by mouth as needed., Disp: , Rfl:  .  nitroGLYCERIN (NITRODUR - DOSED IN MG/24 HR) 0.2 mg/hr patch, Place 1/4 of patch over affected region. Remove and replace once daily.  Slightly alter skin placement daily, Disp: 30 patch, Rfl: 1 .  pravastatin (PRAVACHOL) 40 MG tablet, Take 1 tablet (40 mg total) by mouth daily., Disp: 90 tablet, Rfl: 1 .  sertraline (ZOLOFT) 100 MG tablet, Take 1 tablet (100 mg total) by mouth daily. (Patient taking differently: Take 50 mg by mouth daily. ), Disp: 90 tablet, Rfl: 3   Review of Systems:   Review of  Systems  Constitutional: Negative.   HENT: Negative.   Eyes: Negative.   Respiratory: Negative.  Negative for shortness of breath, wheezing and stridor.   Cardiovascular: Negative.  Negative for chest pain and palpitations.  Gastrointestinal: Negative.   Genitourinary: Negative.   Musculoskeletal: Negative.   Skin: Positive for itching and rash.  Neurological: Negative.   Endo/Heme/Allergies: Negative.   Psychiatric/Behavioral: Negative.     Vitals:   Vitals:   04/10/17 1102  BP: 120/80  Pulse: 72  SpO2: 98%  Weight: 125 lb (56.7 kg)  Height: 5\' 7"  (1.702 m)     Body mass index is 19.58 kg/m.  Physical Exam:   Physical Exam  Constitutional: She appears well-developed. She is cooperative.  Non-toxic appearance. She does not have a sickly appearance. She does not appear ill. No distress.  Cardiovascular: Normal rate, regular rhythm, S1 normal, S2 normal, normal heart sounds and normal pulses.   No LE edema  Pulmonary/Chest: Effort normal and breath sounds normal.  Neurological: She is alert.  Skin: Rash noted.  Multiple erythematous linear areas along bilateral hands, chest, abdomen, inner thighs. No active drainage.  Nursing note and vitals reviewed.     Assessment and Plan:    Problem List Items Addressed This Visit    None    Visit Diagnoses    Contact dermatitis due to poison ivy    -  Primary Patient has failed outpatient treatment for poison ivy. Rash seems to continue to spread and  causes itching. Received 40 mg Depo-Medrol injection while in office and tolerated well. We discussed taking an oral antihistamine daily for about a week to help with itching. Avoid itching and scratching and may use ice packs to help with this. May continue antibiotics appointment. Follow-up if symptoms worsen or do not improve despite treatment. I recommended that she seek medical attention immediately if develops any shortness of breath or issues with airway.    Relevant  Medications   methylPREDNISolone acetate (DEPO-MEDROL) injection 40 mg (Completed)         . Reviewed expectations re: course of current medical issues. . Discussed self-management of symptoms. . Outlined signs and symptoms indicating need for more acute intervention. . Patient verbalized understanding and all questions were answered. . See orders for this visit as documented in the electronic medical record. . Patient received an After-Visit Summary.  CMA or LPN served as scribe during this visit. History, Physical, and Plan performed by medical provider. Documentation and orders reviewed and attested to.  [name], PA-C

## 2017-04-18 ENCOUNTER — Ambulatory Visit: Payer: Commercial Managed Care - HMO

## 2017-04-24 ENCOUNTER — Ambulatory Visit (INDEPENDENT_AMBULATORY_CARE_PROVIDER_SITE_OTHER): Payer: Commercial Managed Care - HMO

## 2017-04-24 DIAGNOSIS — E538 Deficiency of other specified B group vitamins: Secondary | ICD-10-CM | POA: Diagnosis not present

## 2017-04-24 MED ORDER — CYANOCOBALAMIN 1000 MCG/ML IJ SOLN
1000.0000 ug | Freq: Once | INTRAMUSCULAR | Status: AC
  Administered 2017-04-24: 1000 ug via INTRAMUSCULAR

## 2017-05-01 NOTE — Progress Notes (Signed)
I reviewed this encounter for B12 injection.  [name] PA-C 05/01/17

## 2017-05-15 ENCOUNTER — Other Ambulatory Visit: Payer: Self-pay | Admitting: Physician Assistant

## 2017-05-19 ENCOUNTER — Other Ambulatory Visit: Payer: Commercial Managed Care - HMO

## 2017-05-29 ENCOUNTER — Ambulatory Visit (INDEPENDENT_AMBULATORY_CARE_PROVIDER_SITE_OTHER): Payer: Commercial Managed Care - HMO | Admitting: Emergency Medicine

## 2017-05-29 ENCOUNTER — Other Ambulatory Visit (INDEPENDENT_AMBULATORY_CARE_PROVIDER_SITE_OTHER): Payer: Commercial Managed Care - HMO

## 2017-05-29 DIAGNOSIS — E559 Vitamin D deficiency, unspecified: Secondary | ICD-10-CM | POA: Diagnosis not present

## 2017-05-29 DIAGNOSIS — E7800 Pure hypercholesterolemia, unspecified: Secondary | ICD-10-CM | POA: Diagnosis not present

## 2017-05-29 DIAGNOSIS — E538 Deficiency of other specified B group vitamins: Secondary | ICD-10-CM

## 2017-05-29 LAB — LIPID PANEL
CHOL/HDL RATIO: 3
Cholesterol: 207 mg/dL — ABNORMAL HIGH (ref 0–200)
HDL: 77.7 mg/dL (ref >39.00)
LDL CALC: 112 mg/dL — AB (ref 0–99)
NONHDL: 129.33
TRIGLYCERIDES: 89 mg/dL (ref 0.0–149.0)
VLDL: 17.8 mg/dL (ref 0.0–40.0)

## 2017-05-29 LAB — VITAMIN D 25 HYDROXY (VIT D DEFICIENCY, FRACTURES): VITD: 42.26 ng/mL (ref 30.00–100.00)

## 2017-05-29 MED ORDER — CYANOCOBALAMIN 1000 MCG/ML IJ SOLN
1000.0000 ug | Freq: Once | INTRAMUSCULAR | Status: AC
  Administered 2017-05-29: 1000 ug via INTRAMUSCULAR

## 2017-06-23 ENCOUNTER — Telehealth: Payer: Self-pay | Admitting: Physician Assistant

## 2017-06-23 NOTE — Telephone Encounter (Signed)
Pt called reporting stomach pain, diarrhea, nausea after eating. Transferred to [facility].   -LL

## 2017-06-23 NOTE — Telephone Encounter (Signed)
Spoke to patient and she stated she had not gone to ED.   Patient states her "gallbladder attacks" usually last a few hours. Her current gallbladder attack has been going on since Thursday. Patient is not wanting to come into the office but more wanting a referral to GI. Patient states she has not been seen by a specialist in the past. Patient is aware referral to GI may warrant an appointment with her PCP since gallbladder attacks have not been addressed her in appointments prior to.   Advised patient I would reach out to [facility] and her nurse, Butch Penny, around her concerns and symptoms. Butch Penny is aware to contact patient with Samantha's interventions.

## 2017-06-23 NOTE — Telephone Encounter (Signed)
Patient Name: Courtney Hood  DOB: May 05, 1946    Initial Comment Caller has a gallbladder attack and started Thursday night.    Nurse Assessment  Nurse: Raphael Gibney, RN, Vanita Ingles Date/Time (Eastern Time): 06/23/2017 9:34:08 AM  Confirm and document reason for call. If symptomatic, describe symptoms. ---Caller states she has had gallbladder attack since Thursday. She has been nauseated. Has been having diarrhea She is having pain on the right side of her abd. Mylanta helps the pain. having a lot of gas.  Does the patient have any new or worsening symptoms? ---Yes  Will a triage be completed? ---Yes  Related visit to physician within the last 2 weeks? ---No  Does the PT have any chronic conditions? (i.e. diabetes, asthma, etc.) ---No  Is this a behavioral health or substance abuse call? ---No     Guidelines    Guideline Title Affirmed Question Affirmed Notes  Abdominal Pain - Female Age > 60 years    Final Disposition User   See Physician within 24 Hours [facility], Therapist, sports, [employer] office and spoke to [place] as pt wants to know if they do ultrasounds of gallbladder in the office. States they do not. Pt states she will go to the ER at [facility] or [place] rather than coming to the office.  Pain has been intermittent since Thursday night.   Referrals  GO TO FACILITY UNDECIDED   Disagree/Comply: Comply

## 2017-06-23 NOTE — Telephone Encounter (Signed)
Noted. Awaiting [facility] triage

## 2017-06-23 NOTE — Telephone Encounter (Signed)
Noted. Will follow chart to ensure arrives to ED

## 2017-06-23 NOTE — Telephone Encounter (Signed)
I think that it would be best for patient to be evaluated for this, as I have never addressed these issues with her, please call her and see if she is willing to be seen for this.  Prior to sending patient to GI, I would like to do an initial work-up myself to make sure that we are sending to the correct specialty as well as making sure she doesn't need more urgent evaluation. If appropriate we can order appropriate imaging and labs if needed.  Any worsening pain in the meantime, she should go to the ER.  [name] PA-C, 06/23/17

## 2017-06-23 NOTE — Telephone Encounter (Signed)
[facility] note created. See other note.

## 2017-06-24 ENCOUNTER — Encounter (HOSPITAL_COMMUNITY): Payer: Self-pay | Admitting: Emergency Medicine

## 2017-06-24 ENCOUNTER — Emergency Department (HOSPITAL_COMMUNITY)
Admission: EM | Admit: 2017-06-24 | Discharge: 2017-06-24 | Disposition: A | Discharge status: Home / Self Care | Payer: Medicare HMO | Attending: Emergency Medicine | Admitting: Emergency Medicine

## 2017-06-24 ENCOUNTER — Encounter: Payer: Self-pay | Admitting: Physician Assistant

## 2017-06-24 ENCOUNTER — Emergency Department (HOSPITAL_COMMUNITY): Payer: Medicare HMO

## 2017-06-24 ENCOUNTER — Ambulatory Visit (INDEPENDENT_AMBULATORY_CARE_PROVIDER_SITE_OTHER): Payer: Medicare HMO | Admitting: Physician Assistant

## 2017-06-24 VITALS — BP 108/70 | HR 61 | Temp 98.4°F | Ht 67.0 in | Wt 123.0 lb

## 2017-06-24 DIAGNOSIS — R1011 Right upper quadrant pain: Secondary | ICD-10-CM | POA: Diagnosis not present

## 2017-06-24 DIAGNOSIS — Z79899 Other long term (current) drug therapy: Secondary | ICD-10-CM | POA: Insufficient documentation

## 2017-06-24 DIAGNOSIS — R1084 Generalized abdominal pain: Secondary | ICD-10-CM | POA: Diagnosis not present

## 2017-06-24 DIAGNOSIS — Z87891 Personal history of nicotine dependence: Secondary | ICD-10-CM | POA: Insufficient documentation

## 2017-06-24 LAB — COMPREHENSIVE METABOLIC PANEL
ALBUMIN: 4.5 g/dL (ref 3.5–5.0)
ALK PHOS: 61 U/L (ref 38–126)
ALT: 10 U/L — AB (ref 14–54)
ANION GAP: 10 (ref 5–15)
AST: 21 U/L (ref 15–41)
BILIRUBIN TOTAL: 0.7 mg/dL (ref 0.3–1.2)
BUN: 16 mg/dL (ref 6–20)
CALCIUM: 10.7 mg/dL — AB (ref 8.9–10.3)
CO2: 27 mmol/L (ref 22–32)
CREATININE: 0.97 mg/dL (ref 0.44–1.00)
Chloride: 103 mmol/L (ref 101–111)
GFR calc Af Amer: >60 mL/min (ref >60)
GFR calc non Af Amer: 58 mL/min — ABNORMAL LOW (ref >60)
GLUCOSE: 92 mg/dL (ref 65–99)
Potassium: 4.1 mmol/L (ref 3.5–5.1)
Sodium: 140 mmol/L (ref 135–145)
TOTAL PROTEIN: 7.5 g/dL (ref 6.5–8.1)

## 2017-06-24 LAB — URINALYSIS, ROUTINE W REFLEX MICROSCOPIC
Bacteria, UA: NONE SEEN
Bilirubin Urine: NEGATIVE
GLUCOSE, UA: NEGATIVE mg/dL
HGB URINE DIPSTICK: NEGATIVE
Ketones, ur: 5 mg/dL — AB
NITRITE: NEGATIVE
PROTEIN: NEGATIVE mg/dL
SPECIFIC GRAVITY, URINE: 1.015 (ref 1.005–1.030)
pH: 5 (ref 5.0–8.0)

## 2017-06-24 LAB — CBC
HCT: 48.1 % — ABNORMAL HIGH (ref 36.0–46.0)
Hemoglobin: 15.9 g/dL — ABNORMAL HIGH (ref 12.0–15.0)
MCH: 29.9 pg (ref 26.0–34.0)
MCHC: 33.1 g/dL (ref 30.0–36.0)
MCV: 90.6 fL (ref 78.0–100.0)
PLATELETS: 219 10*3/uL (ref 150–400)
RBC: 5.31 MIL/uL — ABNORMAL HIGH (ref 3.87–5.11)
RDW: 12.9 % (ref 11.5–15.5)
WBC: 6.7 10*3/uL (ref 4.0–10.5)

## 2017-06-24 LAB — LIPASE, BLOOD: Lipase: 31 U/L (ref 11–51)

## 2017-06-24 MED ORDER — TRAMADOL HCL 50 MG PO TABS: 50.0000 mg | ORAL_TABLET | Freq: Four times a day (QID) | ORAL | 0 refills | Status: DC | PRN

## 2017-06-24 MED ORDER — SODIUM CHLORIDE 0.9 % IV BOLUS (SEPSIS)
500.0000 mL | Freq: Once | INTRAVENOUS | Status: AC
  Administered 2017-06-24: 500 mL via INTRAVENOUS

## 2017-06-24 MED ORDER — ONDANSETRON HCL 4 MG/2ML IJ SOLN
4.0000 mg | INTRAMUSCULAR | Status: AC
  Administered 2017-06-24: 4 mg via INTRAVENOUS
  Filled 2017-06-24: qty 2

## 2017-06-24 MED ORDER — FENTANYL CITRATE (PF) 100 MCG/2ML IJ SOLN
50.0000 ug | Freq: Once | INTRAMUSCULAR | Status: AC
  Administered 2017-06-24: 50 ug via INTRAVENOUS
  Filled 2017-06-24: qty 2

## 2017-06-24 MED ORDER — ONDANSETRON HCL 4 MG PO TABS: 4.0000 mg | ORAL_TABLET | Freq: Three times a day (TID) | ORAL | 0 refills | Status: DC | PRN

## 2017-06-24 NOTE — Progress Notes (Signed)
Courtney Hood is a 71 y.o. female here for RLQ abdominal pain.  I acted as a Education administrator for [employer], PA-C [employer], LPN  History of Present Illness:   Chief Complaint  Patient presents with  . RLQ abdominial pain    Thinks Gallbladder attack    Abdominal Pain  This is a recurrent problem. Episode onset: Started Thursday night. The onset quality is sudden. The problem occurs constantly. The problem has been waxing and waning. The pain is located in the RLQ. The pain is at a severity of 7/10. The pain is moderate. The quality of the pain is burning. The abdominal pain does not radiate. Associated symptoms include diarrhea, flatus, headaches, nausea and weight loss. Pertinent negatives include no dysuria, fever, hematuria or vomiting. The pain is aggravated by eating, movement, deep breathing and coughing. The pain is relieved by being still. She has tried antacids (Mylanta and Tums) for the symptoms. The treatment provided mild relief.   Patient reports that she has had these attacks multiple times. First time was about 9 years ago, went to her PCP and had an u/s that was negative for any gallbladder pathology. She has had 2-3 "attacks" since that time. Symptoms this time started Thursday and are "worse than all previous times." She is mainly eating soup and crackers at this point.   Past Medical History:  Diagnosis Date  . Osteopenia of femoral neck, bilateral   . Primary hyperparathyroidism ([place])      Social History   Social History  . Marital status: Married    Spouse name: N/A  . Number of children: N/A  . Years of education: N/A   Occupational History  . Not on file.   Social History Main Topics  . Smoking status: Former Smoker    Quit date: 12/06/1999  . Smokeless tobacco: Never Used     Comment: Married, lives wih spouse. Moved to [place] area from [place] in spring 2011  . Alcohol use Yes     Comment: social-maybe 1 glass wine twice a month.   . Drug use: No   . Sexual activity: Not on file   Other Topics Concern  . Not on file   Social History Narrative   Goes by [employer].   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 ABDOMINAL HYSTERECTOMY    . APPENDECTOMY      Family History  Problem Relation Age of Onset  . Colon cancer Brother 75  . Leukemia Father   . Esophageal cancer Neg Hx   . Stomach cancer Neg Hx   . Rectal cancer Neg Hx     Allergies  Allergen Reactions  . Actonel [Risedronate Sodium] Rash    Current Medications:   Current Outpatient Prescriptions:  .  buPROPion (WELLBUTRIN XL) 150 MG 24 hr tablet, Take 1 tablet (150 mg total) by mouth daily. Start 150 mg ER PO qam., Disp: 90 tablet, Rfl: 0 .  Cholecalciferol 4000 units CAPS, Take one tablet daily., Disp: 30 capsule, Rfl: 0 .  ibuprofen (ADVIL,MOTRIN) 200 MG tablet, Take 200 mg by mouth as needed., Disp: , Rfl:  .  nitroGLYCERIN (NITRODUR - DOSED IN MG/24 HR) 0.2 mg/hr patch, Place 1/4 of patch over affected region. Remove and replace once daily.  Slightly alter skin placement daily, Disp: 30  patch, Rfl: 1 .  pravastatin (PRAVACHOL) 40 MG tablet, TAKE ONE TABLET BY MOUTH DAILY, Disp: 90 tablet, Rfl: 1 .  sertraline (ZOLOFT) 100 MG tablet, Take 1 tablet (100 mg total) by mouth daily. (Patient taking differently: Take 50 mg by mouth daily. ), Disp: 90 tablet, Rfl: 3   Review of Systems:   Review of Systems  Constitutional: Positive for weight loss. Negative for fever.  Gastrointestinal: Positive for abdominal pain, diarrhea, flatus and nausea. Negative for vomiting.  Genitourinary: Negative for dysuria and hematuria.  Neurological: Positive for headaches.    Vitals:   Vitals:   06/24/17 1417  BP: 108/70  Pulse: 61  Temp: 98.4 F (36.9 C)  TempSrc: Oral  SpO2: 95%  Weight: 123 lb (55.8 kg)  Height: 5\' 7"  (1.702  m)     Body mass index is 19.26 kg/m.  Physical Exam:   Physical Exam  Constitutional: She appears well-developed. She is cooperative.  Non-toxic appearance. She does not have a sickly appearance. She does not appear ill. No distress.  Cardiovascular: Normal rate, regular rhythm, S1 normal, S2 normal, normal heart sounds and normal pulses.   No LE edema  Pulmonary/Chest: Effort normal and breath sounds normal.  Abdominal: Normal appearance and bowel sounds are normal. There is tenderness in the right upper quadrant and epigastric area. There is no rigidity, no rebound and no guarding.  Neurological: She is alert. GCS eye subscore is 4. GCS verbal subscore is 5. GCS motor subscore is 6.  Skin: Skin is warm, dry and intact.  Psychiatric: She has a normal mood and affect. Her speech is normal and behavior is normal.  Patient uncomfortable and unable to sit still throughout exam  Nursing note and vitals reviewed.   Assessment and Plan:    [name] was seen today for rlq abdominial pain.  Diagnoses and all orders for this visit:  Generalized abdominal pain   Dr. Juleen China in to evaluate patient as well. Discussed need for stat labs and imaging. We discussed pros and cons of pursuing outpatient treatment vs ER. Patient opted to go to the ER, stated that she did not need EMS.   . Reviewed expectations re: course of current medical issues. . Discussed self-management of symptoms. . Outlined signs and symptoms indicating need for more acute intervention. . Patient verbalized understanding and all questions were answered. . See orders for this visit as documented in the electronic medical record. . Patient received an After-Visit Summary.  CMA or LPN served as scribe during this visit. History, Physical, and Plan performed by medical provider. Documentation and orders reviewed and attested to.  [name], PA-C

## 2017-06-24 NOTE — Telephone Encounter (Signed)
Patient scheduled today at 2pm for gallbladder discomfort; appt needed per Sam note; see [facility] note.

## 2017-06-24 NOTE — ED Notes (Signed)
Delay on medication administration.  [place] at bedside

## 2017-06-24 NOTE — ED Triage Notes (Signed)
Pt complaint of intermittent RLQ pain since Thursday with associated nausea and diarrhea.

## 2017-06-24 NOTE — ED Provider Notes (Signed)
[facility] DEPT Provider Note   CSN: [id number] Arrival date & time: 06/24/17  1551     History   Chief Complaint Chief Complaint  Patient presents with  . Abdominal Pain    HPI Courtney Hood is a 71 y.o. female.He presents emergency Department with chief complaint of right upper quadrant abdominal pain. She has had self-limited episodes of the same pain the past several years. She had previous workup to rule out cholecystitis or biliary colic that was negative. She has had 4 days of abdominal pain That starts next to the periumbilical region and up to the RUQ and then to the R shoulder. It is worse postprandially and she has associated nausea. She noticed loose stools today. She denies vomiting,Melena or hematochezia.  HPI  Past Medical History:  Diagnosis Date  . Osteopenia of femoral neck, bilateral   . Primary hyperparathyroidism [facility])     Patient Active Problem List   Diagnosis Date Noted  . Supraspinatus tendon tear, left, initial encounter 03/07/2017  . Hair loss 01/30/2017  . B12 deficiency 08/15/2015  . Right lumbar radiculopathy 04/14/2015  . Primary hyperparathyroidism ([place]) 08/27/2010  . Hyperlipidemia 07/25/2010  . Major depressive disorder 07/25/2010  . Osteopenia of femoral neck, bilateral 07/25/2010    Past Surgical History:  Procedure Laterality Date  . ABDOMINAL HYSTERECTOMY    . APPENDECTOMY      OB History    No data available       Home Medications    Prior to Admission medications   Medication Sig Start Date End Date Taking? Authorizing Provider  buPROPion (WELLBUTRIN XL) 150 MG 24 hr tablet Take 1 tablet (150 mg total) by mouth daily. Start 150 mg ER PO qam. 01/30/17  Yes [name], PA  Cholecalciferol 4000 units CAPS Take one tablet daily. Patient taking differently: Take 2,000 Units by mouth daily. Take one tablet daily. 03/17/17  Yes [name], PA  ibuprofen (ADVIL,MOTRIN) 200 MG tablet Take 200 mg by mouth as needed.    Yes [provider]  pravastatin (PRAVACHOL) 40 MG tablet TAKE ONE TABLET BY MOUTH DAILY 05/15/17  Yes [name], PA  sertraline (ZOLOFT) 100 MG tablet Take 1 tablet (100 mg total) by mouth daily. Patient taking differently: Take 50 mg by mouth daily.  01/02/17  Yes [name], PA  nitroGLYCERIN (NITRODUR - DOSED IN MG/24 HR) 0.2 mg/hr patch Place 1/4 of patch over affected region. Remove and replace once daily.  Slightly alter skin placement daily 03/07/17   Gerda Diss, DO  ondansetron (ZOFRAN) 4 MG tablet Take 1 tablet (4 mg total) by mouth every 8 (eight) hours as needed for nausea or vomiting. Patient not taking: Reported on 06/26/2017 06/24/17   Margarita Mail, PA-C  traMADol (ULTRAM) 50 MG tablet Take 1 tablet (50 mg total) by mouth every 6 (six) hours as needed. Patient not taking: Reported on 06/26/2017 06/24/17   Margarita Mail, PA-C    Family History Family History  Problem Relation Age of Onset  . Colon cancer Brother 38  . Leukemia Father   . Esophageal cancer Neg Hx   . Stomach cancer Neg Hx   . Rectal cancer Neg Hx     Social History Social History  Substance Use Topics  . Smoking status: Former Smoker    Quit date: 12/06/1999  . Smokeless tobacco: Never Used     Comment: Married, lives wih spouse. Moved to [place] area from [place] in spring 2011  . Alcohol use Yes  Comment: social-maybe 1 glass wine twice a month.      Allergies   Actonel [risedronate sodium]   Review of Systems Review of Systems Ten systems reviewed and are negative for acute change, except as noted in the HPI.    Physical Exam Updated Vital Signs BP 129/68 (BP Location: Left Arm)   Pulse (!) 57   Temp 97.9 F (36.6 C) (Oral)   Resp 16   Ht 5\' 7"  (1.702 m)   Wt 55.8 kg (123 lb)   SpO2 96%   BMI 19.26 kg/m   Physical Exam  Constitutional: She is oriented to person, place, and time. She appears well-developed and well-nourished. No distress.  HENT:  Head:  Normocephalic and atraumatic.  Eyes: Conjunctivae are normal. No scleral icterus.  Neck: Normal range of motion.  Cardiovascular: Normal rate, regular rhythm and normal heart sounds.  Exam reveals no gallop and no friction rub.   No murmur heard. Pulmonary/Chest: Effort normal and breath sounds normal. No respiratory distress.  Abdominal: Soft. Bowel sounds are normal. She exhibits no distension and no mass. There is tenderness. There is no guarding and no tenderness at McBurney's point.    Neurological: She is alert and oriented to person, place, and time.  Skin: Skin is warm and dry. She is not diaphoretic.  Psychiatric: Her behavior is normal.  Nursing note and vitals reviewed.    ED Treatments / Results  Labs (all labs ordered are listed, but only abnormal results are displayed) Labs Reviewed  COMPREHENSIVE METABOLIC PANEL - Abnormal; Notable for the following:       Result Value   Calcium 10.7 (*)    ALT 10 (*)    GFR calc non Af Amer 58 (*)    All other components within normal limits  CBC - Abnormal; Notable for the following:    RBC 5.31 (*)    Hemoglobin 15.9 (*)    HCT 48.1 (*)    All other components within normal limits  URINALYSIS, ROUTINE W REFLEX MICROSCOPIC - Abnormal; Notable for the following:    APPearance HAZY (*)    Ketones, ur 5 (*)    Leukocytes, UA MODERATE (*)    Squamous Epithelial / LPF 0-5 (*)    All other components within normal limits  LIPASE, BLOOD    EKG  EKG Interpretation None       Radiology No results found.  Procedures Procedures (including critical care time)  Medications Ordered in ED Medications  sodium chloride 0.9 % bolus 500 mL (0 mLs Intravenous Stopped 06/24/17 2221)  fentaNYL (SUBLIMAZE) injection 50 mcg (50 mcg Intravenous Given 06/24/17 2130)  ondansetron (ZOFRAN) injection 4 mg (4 mg Intravenous Given 06/24/17 2130)     Initial Impression / Assessment and Plan / ED Course  I have reviewed the triage vital  signs and the nursing notes.  Pertinent labs & imaging results that were available during my care of the patient were reviewed by me and considered in my medical decision making (see chart for details).    Patient's labs without significant abnormality.  The results are negative for any significant abnormality. Pain is improved in the ed. [place] is without significant abnormality. I have suggested that the patient follow up for hida scan. She is able to tolerat PO fluids. Patient seen in shared visit with attending physician. Who agrees with assessment, work up , treatment, and plan for discharge.    Final Clinical Impressions(s) / ED Diagnoses   Final diagnoses:  RUQ abdominal pain    New Prescriptions Discharge Medication List as of 06/24/2017 11:17 PM    START taking these medications   Details  ondansetron (ZOFRAN) 4 MG tablet Take 1 tablet (4 mg total) by mouth every 8 (eight) hours as needed for nausea or vomiting., Starting Tue 06/24/2017, Print    traMADol (ULTRAM) 50 MG tablet Take 1 tablet (50 mg total) by mouth every 6 (six) hours as needed., Starting Tue 06/24/2017, Print         Panda Crossin, [place], PA-C 06/27/17 1743    Gareth Morgan, MD 06/30/17 1014

## 2017-06-24 NOTE — Telephone Encounter (Signed)
Noted  

## 2017-06-24 NOTE — Discharge Instructions (Signed)
Your labs and imaging were negative. You may need to have a HIDA scan which can be ordered by your primary care doctor.   You may need to start on a acid reducer. Abdominal (belly) pain can be caused by many things. Your caregiver performed an examination and possibly ordered blood/urine tests and imaging (CT scan, x-rays, ultrasound). Many cases can be observed and treated at home after initial evaluation in the emergency department. Even though you are being discharged home, abdominal pain can be unpredictable. Therefore, you need a repeated exam if your pain does not resolve, returns, or worsens. Most patients with abdominal pain don't have to be admitted to the hospital or have surgery, but serious problems like appendicitis and gallbladder attacks can start out as nonspecific pain. Many abdominal conditions cannot be diagnosed in one visit, so follow-up evaluations are very important. SEEK IMMEDIATE MEDICAL ATTENTION IF: The pain does not go away or becomes severe.  A temperature above 101 develops.  Repeated vomiting occurs (multiple episodes).  The pain becomes localized to portions of the abdomen. The right side could possibly be appendicitis. In an adult, the left lower portion of the abdomen could be colitis or diverticulitis.  Blood is being passed in stools or vomit (bright red or black tarry stools).  Return also if you develop chest pain, difficulty breathing, dizziness or fainting, or become confused, poorly responsive, or inconsolable (young children).

## 2017-06-24 NOTE — Telephone Encounter (Signed)
Pt scheduled today at 2:00 PM to see [facility].

## 2017-06-25 ENCOUNTER — Telehealth: Payer: Self-pay | Admitting: Family Medicine

## 2017-06-25 NOTE — Telephone Encounter (Signed)
Spoke to pt, told her appointment was changed to 11:00 AM tomorrow to see Aldona Bar. Pt verbalized understanding.

## 2017-06-25 NOTE — Telephone Encounter (Signed)
[place] [facility]  Patient Name: Courtney Hood  DOB: 1946-07-17    Initial Comment Caller states she was seen in the ER and they recommended a HIDA scan for her abd pain.   Nurse Assessment  Nurse: Harlow Mares, RN, Suanne Marker Date/Time (Eastern Time): 06/25/2017 1:33:35 PM  Confirm and document reason for call. If symptomatic, describe symptoms. ---Caller states she was seen in the ER and they recommended a HIDA scan for her abd pain. Wanted to consult with her MD about this prior to doing this. The HIDA scan has not yet been scheduled.  Does the patient have any new or worsening symptoms? ---Yes  Will a triage be completed? ---Yes  Related visit to physician within the last 2 weeks? ---Yes  Does the PT have any chronic conditions? (i.e. diabetes, asthma, etc.) ---No  Is this a behavioral health or substance abuse call? ---No     Guidelines    Guideline Title Affirmed Question Affirmed Notes  Abdominal Pain - Female [1] MODERATE pain (e.g., interferes with normal activities) AND [2] pain comes and goes (cramps) AND [3] present > 24 hours (Exception: pain with Vomiting or Diarrhea - see that Guideline)    Final Disposition User   See Physician within 24 Hours [place], RN, Suanne Marker    Comments  Appt scheduled with Briscoe Deutscher at the [facility] office for 11:15 am tomorrow.   Referrals  REFERRED TO PCP OFFICE   Disagree/Comply: Comply

## 2017-06-26 ENCOUNTER — Ambulatory Visit (INDEPENDENT_AMBULATORY_CARE_PROVIDER_SITE_OTHER): Payer: Medicare HMO | Admitting: Physician Assistant

## 2017-06-26 ENCOUNTER — Encounter: Payer: Self-pay | Admitting: Physician Assistant

## 2017-06-26 VITALS — BP 100/62 | HR 72 | Temp 97.9°F | Ht 67.0 in | Wt 123.0 lb

## 2017-06-26 DIAGNOSIS — R1084 Generalized abdominal pain: Secondary | ICD-10-CM | POA: Diagnosis not present

## 2017-06-26 NOTE — Progress Notes (Signed)
Courtney Hood is a 71 y.o. female is here to discuss follow up from the ED for Abdominal Pain.  I acted as a Education administrator for [employer], PA-C Anselmo Pickler, LPN  History of Present Illness:   Chief Complaint  Patient presents with  . Follow-up    ED  . Abdominal Pain    Abdominal Pain  Chronicity: Pt here for follow up from ED, pain is gone at present. The pain is at a severity of 0/10. The patient is experiencing no pain. Quality: pt states abdomen is tender, but no pain. Pertinent negatives include no constipation, diarrhea, dysuria, frequency, melena or nausea. Relieved by: Tramadol and Zofran, pt took yesterday.   The ED provider's note is not yet complete for me to review.  U/S completed --> shows mild pelvicaliectasis of the left kidney. Normal gallbladder and bile ducts.   She continues to eat bland foods with soups/crackers.  Labs completed in ED essentially unremarkable.  There are no preventive care reminders to display for this patient.  Past Medical History:  Diagnosis Date  . Osteopenia of femoral neck, bilateral   . Primary hyperparathyroidism ([place])      Social History   Social History  . Marital status: Married    Spouse name: N/A  . Number of children: N/A  . Years of education: N/A   Occupational History  . Not on file.   Social History Main Topics  . Smoking status: Former Smoker    Quit date: 12/06/1999  . Smokeless tobacco: Never Used     Comment: Married, lives wih spouse. Moved to [place] area from [place] in spring 2011  . Alcohol use Yes     Comment: social-maybe 1 glass wine twice a month.   . Drug use: No  . Sexual activity: Not on file   Other Topics Concern  . Not on file   Social History Narrative   Goes by [employer].   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 ABDOMINAL  HYSTERECTOMY    . APPENDECTOMY      Family History  Problem Relation Age of Onset  . Colon cancer Brother 71  . Leukemia Father   . Esophageal cancer Neg Hx   . Stomach cancer Neg Hx   . Rectal cancer Neg Hx     PMHx, SurgHx, SocialHx, FamHx, Medications, and Allergies were reviewed in the Visit Navigator and updated as appropriate.   Patient Active Problem List   Diagnosis Date Noted  . Supraspinatus tendon tear, left, initial encounter 03/07/2017  . Hair loss 01/30/2017  . B12 deficiency 08/15/2015  . Right lumbar radiculopathy 04/14/2015  . Primary hyperparathyroidism ([place]) 08/27/2010  . Hyperlipidemia 07/25/2010  . Major depressive disorder 07/25/2010  . Osteopenia of femoral neck, bilateral 07/25/2010    Social History  Substance Use Topics  . Smoking status: Former Smoker    Quit date: 12/06/1999  . Smokeless tobacco: Never Used     Comment: Married, lives wih spouse. Moved to [place] area from [place] in spring 2011  . Alcohol use Yes     Comment: social-maybe 1 glass wine twice a month.     Current Medications and Allergies:    Current Outpatient Prescriptions:  .  buPROPion (WELLBUTRIN XL) 150 MG 24 hr tablet, Take 1 tablet (150 mg  total) by mouth daily. Start 150 mg ER PO qam., Disp: 90 tablet, Rfl: 0 .  Cholecalciferol 4000 units CAPS, Take one tablet daily. (Patient taking differently: Take 2,000 Units by mouth daily. Take one tablet daily.), Disp: 30 capsule, Rfl: 0 .  ibuprofen (ADVIL,MOTRIN) 200 MG tablet, Take 200 mg by mouth as needed., Disp: , Rfl:  .  nitroGLYCERIN (NITRODUR - DOSED IN MG/24 HR) 0.2 mg/hr patch, Place 1/4 of patch over affected region. Remove and replace once daily.  Slightly alter skin placement daily, Disp: 30 patch, Rfl: 1 .  pravastatin (PRAVACHOL) 40 MG tablet, TAKE ONE TABLET BY MOUTH DAILY, Disp: 90 tablet, Rfl: 1 .  sertraline (ZOLOFT) 100 MG tablet, Take 1 tablet (100 mg total) by mouth daily. (Patient taking differently: Take 50  mg by mouth daily. ), Disp: 90 tablet, Rfl: 3 .  ondansetron (ZOFRAN) 4 MG tablet, Take 1 tablet (4 mg total) by mouth every 8 (eight) hours as needed for nausea or vomiting. (Patient not taking: Reported on 06/26/2017), Disp: 10 tablet, Rfl: 0 .  traMADol (ULTRAM) 50 MG tablet, Take 1 tablet (50 mg total) by mouth every 6 (six) hours as needed. (Patient not taking: Reported on 06/26/2017), Disp: 15 tablet, Rfl: 0   Allergies  Allergen Reactions  . Actonel [Risedronate Sodium] Rash    Review of Systems   Review of Systems  Cardiovascular: Negative for chest pain, palpitations and orthopnea.  Gastrointestinal: Positive for abdominal pain. Negative for blood in stool, constipation, diarrhea, melena and nausea.  Genitourinary: Negative for dysuria, frequency and urgency.    Vitals:   Vitals:   06/26/17 1103  BP: 100/62  Pulse: 72  Temp: 97.9 F (36.6 C)  TempSrc: Oral  SpO2: 96%  Weight: 123 lb (55.8 kg)  Height: 5\' 7"  (1.702 m)     Body mass index is 19.26 kg/m.   Physical Exam:    Physical Exam  Constitutional: She appears well-developed. She is cooperative.  Non-toxic appearance. She does not have a sickly appearance. She does not appear ill. No distress.  Cardiovascular: Normal rate, regular rhythm, S1 normal, S2 normal, normal heart sounds and normal pulses.   No LE edema  Pulmonary/Chest: Effort normal and breath sounds normal.  Abdominal: Normal appearance and bowel sounds are normal. There is no tenderness. There is no rigidity, no rebound, no guarding and negative Murphy's sign.  Neurological: She is alert. GCS eye subscore is 4. GCS verbal subscore is 5. GCS motor subscore is 6.  Skin: Skin is warm, dry and intact.  Psychiatric: She has a normal mood and affect. Her speech is normal and behavior is normal.  Nursing note and vitals reviewed.      Assessment and Plan:    Naliya was seen today for follow-up and abdominal pain.  Diagnoses and all orders for  this visit:  Generalized abdominal pain   Abdominal pain resolved, however patient reports that she would like to establish with GI for further evaluation of this issue so she can be plugged into them in case it reoccurs. She states several times "I do not want to go through this again." Further work-up per GI. No indication for treatment or imaging at this time.  . Reviewed expectations re: course of current medical issues. . Discussed self-management of symptoms. . Outlined signs and symptoms indicating need for more acute intervention. . Patient verbalized understanding and all questions were answered. . See orders for this visit as documented in the electronic medical record. Marland Kitchen  Patient received an After Visit Summary.  CMA or LPN served as scribe during this visit. History, Physical, and Plan performed by medical provider. Documentation and orders reviewed and attested to.  [name], PA-C [place], [place] 06/26/2017  Follow-up: No Follow-up on file.

## 2017-07-03 ENCOUNTER — Other Ambulatory Visit: Payer: Self-pay | Admitting: Physician Assistant

## 2017-07-04 ENCOUNTER — Encounter: Payer: Self-pay | Admitting: Physician Assistant

## 2017-07-04 ENCOUNTER — Ambulatory Visit (INDEPENDENT_AMBULATORY_CARE_PROVIDER_SITE_OTHER): Payer: Medicare HMO | Admitting: Physician Assistant

## 2017-07-04 VITALS — BP 90/60 | HR 64 | Ht 67.0 in | Wt 123.2 lb

## 2017-07-04 DIAGNOSIS — Z800 Family history of malignant neoplasm of digestive organs: Secondary | ICD-10-CM

## 2017-07-04 DIAGNOSIS — R1031 Right lower quadrant pain: Secondary | ICD-10-CM

## 2017-07-04 DIAGNOSIS — Z86010 Personal history of colonic polyps: Secondary | ICD-10-CM | POA: Diagnosis not present

## 2017-07-04 MED ORDER — PANTOPRAZOLE SODIUM 40 MG PO TBEC: 40.0000 mg | DELAYED_RELEASE_TABLET | Freq: Every day | ORAL | 0 refills | Status: DC

## 2017-07-04 NOTE — Progress Notes (Signed)
Subjective:    Patient ID: Courtney Hood, female    DOB: [birth year], 71 y.o.   MRN: [id number]  HPI Courtney Hood is a pleasant 71 year old white female, known to Dr. Ardis Hughs from [facility] who comes in today after recent episode of abdominal pain. She is referred by [name] PA-C/l primary care. Patient had colonoscopy done in May 2015 per Dr. Ardis Hughs with finding of 2 polyps both of these were hyperplastic. She is indicated for 5 year interval follow-up given family history of colon cancer in her brother. She has history of depression, hyperlipidemia and primary hyperparathyroidism. She status post hysterectomy and appendectomy. Patient relates 4 discrete episodes of severe abdominal pain over the past 8 years. The last episode was about 2 years ago. She had the fourth episode about a week and a half ago. She says this episode was bit different. Pain or he starts in the right lower abdomen and radiates into the right upper abdomen and sometimes up into her right shoulder. This pain is very acute, sharp and disabling and may last for several hours. It is associated with nausea but no vomiting. She generally has no changes in her bowel habits with these episodes, however this last time did have diarrhea that lasted for couple of days. She is not aware of any abdominal distention, no known triggers. This episode has been more prolonged and she says she is still not feeling quite right. She has a Education administrator , is hearing a lot of gurgling and rumbling in her abdomen and still has some lower abdominal mild discomfort. He is planning to go on a cruise next week and wants to know what she can do to help her symptoms during that time. Upper abdominal ultrasound was done on 06/24/2017 which shows no gallstones no gallbladder wall thickening and CBD of 4 mm CBC and chemistries were done that same day 10.7 hemoglobin 15.9   Review of Systems Pertinent positive and negative review of systems were  noted in the above HPI section.  All other review of systems was otherwise negative.  Outpatient Encounter Prescriptions as of 07/04/2017  Medication Sig  . buPROPion (WELLBUTRIN XL) 150 MG 24 hr tablet TAKE ONE TABLET BY MOUTH EVERY MORNING  . Cholecalciferol 4000 units CAPS Take one tablet daily. (Patient taking differently: Take 2,000 Units by mouth daily. Take one tablet daily.)  . ibuprofen (ADVIL,MOTRIN) 200 MG tablet Take 200 mg by mouth as needed.  . ondansetron (ZOFRAN) 4 MG tablet Take 1 tablet (4 mg total) by mouth every 8 (eight) hours as needed for nausea or vomiting.  . pravastatin (PRAVACHOL) 40 MG tablet TAKE ONE TABLET BY MOUTH DAILY  . sertraline (ZOLOFT) 100 MG tablet Take 1 tablet (100 mg total) by mouth daily. (Patient taking differently: Take 50 mg by mouth daily. )  . traMADol (ULTRAM) 50 MG tablet Take 1 tablet (50 mg total) by mouth every 6 (six) hours as needed.  . pantoprazole (PROTONIX) 40 MG tablet Take 1 tablet (40 mg total) by mouth daily.  . [DISCONTINUED] nitroGLYCERIN (NITRODUR - DOSED IN MG/24 HR) 0.2 mg/hr patch Place 1/4 of patch over affected region. Remove and replace once daily.  Slightly alter skin placement daily   No facility-administered encounter medications on file as of 07/04/2017.    Allergies  Allergen Reactions  . Actonel [Risedronate Sodium] Rash   Patient Active Problem List   Diagnosis Date Noted  . Supraspinatus tendon tear, left, initial encounter 03/07/2017  . Hair loss 01/30/2017  .  B12 deficiency 08/15/2015  . Right lumbar radiculopathy 04/14/2015  . Primary hyperparathyroidism ([place]) 08/27/2010  . Hyperlipidemia 07/25/2010  . Major depressive disorder 07/25/2010  . Osteopenia of femoral neck, bilateral 07/25/2010   Social History   Social History  . Marital status: Married    Spouse name: N/A  . Number of children: N/A  . Years of education: N/A   Occupational History  . Not on file.   Social History Main Topics  .  Smoking status: Former Smoker    Quit date: 12/06/1999  . Smokeless tobacco: Never Used     Comment: Married, lives wih spouse. Moved to [place] area from [place] in spring 2011  . Alcohol use Yes     Comment: social-maybe 1 glass wine twice a month.   . Drug use: No  . Sexual activity: Not on file   Other Topics Concern  . Not on file   Social History Narrative   Goes by [employer].   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 Tsan family history includes Colon cancer (age of onset: 42) in her brother; Leukemia in her father.      Objective:    Vitals:   07/04/17 0958  BP: 90/60  Pulse: 64    Physical Exam  well-developed older white female, pleasant blood pressure 90/60 pulse 64, height 5 foot 7, weight 123, BMI of 19.3. HEENT; nontraumatic normocephalic EOMI PERRLA sclera anicteric, Cardiovascular; regular rate and rhythm with S1-S2 no murmur or gallop, Pulmonary ;clear bilaterally, Abdomen ;soft, bowel sounds are active somewhat hyperactive she has a right lower quadrant incisional scar, there is mild tenderness in the right mid and right lower quadrant no palpable mass or hepatosplenomegaly, Rectal; exam not done, Ext; no clubbing cyanosis or edema skin warm and dry, Neuropsych; mood and affect appropriate       Assessment & Plan:   #82 71 year old white female presenting with complaints of recurrent episodes of severe right lower quadrant pain radiating into the right upper quadrant, described as sharp and disabling, associated with nausea, no vomiting and generally without diarrhea. Patient had the fourth episode in the past 8 years onset of week or so ago and with this episode had more prolonged symptoms, did develop diarrhea which lasted for a couple of days and is continuing to experience some dyspepsia and lower abdominal discomfort. Upper abdominal ultrasound and labs both  negative Patient is status post hysterectomy and appendectomy. By symptoms and exam I am concerned that she is having episodes of intermittent partial bowel obstruction, possibly secondary to adhesions #2 history of colon polyps-last colonoscopy May 2015 due for follow-up May 2020. Positive family history of colon cancer in patient's brother #4 depression #5  hyperlipidemia #6 history of primary hyperparathyroidism  Plan; Will schedule for CT of the abdomen and pelvis on Monday, 07/07/2017 with contrast. Start Protonix 40 mg by mouth every morning 30 days She can use one half to one dose of MiraLAX on an as-needed basis. Advised small low residue meals Further recommendations pending results of CT;   Paylin Hailu S Taeshaun Rames PA-C 07/04/2017   Cc: [name], [place]

## 2017-07-04 NOTE — Patient Instructions (Addendum)
You have been scheduled for a CT scan of the abdomen and pelvis at [facility] ([address]---this is in the same building as Press photographer).   You are scheduled on 07/07/2017 at 11:15am. You should arrive 15 minutes prior to your appointment time for registration. Please follow the written instructions below on the day of your exam:  WARNING: IF YOU ARE ALLERGIC TO IODINE/X-RAY DYE, PLEASE NOTIFY RADIOLOGY IMMEDIATELY AT [phone]! YOU WILL BE GIVEN A 13 HOUR PREMEDICATION PREP.  1) Do not eat or drink anything after 7am (4 hours prior to your test) 2) You have been given 2 bottles of oral contrast to drink. The solution may taste               better if refrigerated, but do NOT add ice or any other liquid to this solution. Shake             well before drinking.    Drink 1 bottle of contrast @ 9am (2 hours prior to your exam)  Drink 1 bottle of contrast @ 10am (1 hour prior to your exam)  You may take any medications as prescribed with a small amount of water except for the following: Metformin, Glucophage, Glucovance, Avandamet, Riomet, Fortamet, Actoplus Met, Janumet, Glumetza or Metaglip. The above medications must be held the day of the exam AND 48 hours after the exam.  The purpose of you drinking the oral contrast is to aid in the visualization of your intestinal tract. The contrast solution may cause some diarrhea. Before your exam is started, you will be given a small amount of fluid to drink. Depending on your individual set of symptoms, you may also receive an intravenous injection of x-ray contrast/dye. Plan on being at [facility] for 30 minutes or longer, depending on the type of exam you are having performed.  This test typically takes 30-45 minutes to complete.  If you have any questions regarding your exam or if you need to reschedule, you may call the CT department at [phone] between the hours of 8:00 am and 5:00 pm,  Monday-Friday.  ________________________________________________________________________   We have sent Protonix to your pharmacy  Lower roughage diet   Eat small  Meals  May use mIralax as needed

## 2017-07-04 NOTE — Addendum Note (Signed)
Addended by: Oda Kilts on: 07/04/2017 02:57 PM   Modules accepted: Orders

## 2017-07-06 NOTE — Progress Notes (Signed)
I agree with the above note, plan 

## 2017-07-07 ENCOUNTER — Telehealth: Payer: Self-pay | Admitting: Family Medicine

## 2017-07-07 ENCOUNTER — Inpatient Hospital Stay: Admission: RE | Admit: 2017-07-07 | Payer: Medicare HMO | Source: Ambulatory Visit

## 2017-07-07 DIAGNOSIS — I700 Atherosclerosis of aorta: Secondary | ICD-10-CM | POA: Diagnosis not present

## 2017-07-07 DIAGNOSIS — M899 Disorder of bone, unspecified: Secondary | ICD-10-CM | POA: Diagnosis not present

## 2017-07-07 DIAGNOSIS — R1031 Right lower quadrant pain: Secondary | ICD-10-CM | POA: Diagnosis not present

## 2017-07-07 DIAGNOSIS — N281 Cyst of kidney, acquired: Secondary | ICD-10-CM | POA: Diagnosis not present

## 2017-07-07 DIAGNOSIS — M47817 Spondylosis without myelopathy or radiculopathy, lumbosacral region: Secondary | ICD-10-CM | POA: Diagnosis not present

## 2017-07-07 DIAGNOSIS — M47816 Spondylosis without myelopathy or radiculopathy, lumbar region: Secondary | ICD-10-CM | POA: Diagnosis not present

## 2017-07-07 NOTE — Telephone Encounter (Signed)
Medication: SEA SICK PATCH  Pharmacy: [facility], [place], [place] - [address] [phone] (Phone) [phone] (Fax)    Is this a 90 day supply? NO  Is patient out of medication? N/A  If not, how much is left? NEW MEDICATION  Last appointment date: 06/26/17  Next appointment date:N/A  Other comments: Patient going on a cruise   Let patient know to contact pharmacy at the end of the day to make sure medication is ready.   Please allow 48-72 hours to process  Encourage patient to contact the pharmacy for refills or they can request refills through [facility]

## 2017-07-07 NOTE — Telephone Encounter (Signed)
Please advise 

## 2017-07-07 NOTE — Telephone Encounter (Signed)
Okay 

## 2017-07-08 MED ORDER — SCOPOLAMINE 1 MG/3DAYS TD PT72: 1.0000 | MEDICATED_PATCH | TRANSDERMAL | 0 refills | Status: DC

## 2017-07-08 NOTE — Telephone Encounter (Signed)
RX sent to pharmacy  

## 2017-07-10 ENCOUNTER — Telehealth: Payer: Self-pay | Admitting: Family Medicine

## 2017-07-10 NOTE — Telephone Encounter (Signed)
Left pt message asking to call Allison back directly at [phone] to schedule AWV. Thanks! ° °*NOTE* Never had AWV before °

## 2017-07-22 ENCOUNTER — Telehealth: Payer: Self-pay | Admitting: Physician Assistant

## 2017-07-22 NOTE — Telephone Encounter (Signed)
Left a message to call back.

## 2017-07-24 NOTE — Telephone Encounter (Signed)
DPR on file. Results left on her voicemail.

## 2017-07-24 NOTE — Telephone Encounter (Signed)
Please let pt know the Ct scan is negative - no sign of obstruction or bowel thickening. Otherwise abdomen looks good , she has a bone lesion 2 cm on right  Hip which looks benign like a bone island-

## 2017-07-24 NOTE — Telephone Encounter (Signed)
Her CT results are in [place] from [place]. Thank you

## 2017-08-06 ENCOUNTER — Other Ambulatory Visit: Payer: Self-pay | Admitting: Physician Assistant

## 2017-08-20 NOTE — Telephone Encounter (Signed)
Left pt message asking to call Allison back directly at [phone] to schedule AWV + labs with Cassie and CPE with PCP. ° °*NOTE* No hx of AWV °

## 2017-10-09 ENCOUNTER — Other Ambulatory Visit: Payer: Self-pay | Admitting: Physician Assistant

## 2017-10-10 ENCOUNTER — Telehealth: Payer: Self-pay | Admitting: Family Medicine

## 2017-10-10 NOTE — Telephone Encounter (Signed)
Patient called explaining that all bills from our office need to be resubmitted and ensuring that Dr. Juleen China is the supervising attendant on the bills. I advised the patient that I could not guarantee what insurance will or will not pay for however, I would ask them to resubmit all bills and all claims. I will contact the patient once this has been done.

## 2017-10-16 NOTE — Telephone Encounter (Signed)
I called to update the patient that I received an email from charge corrections that this has been completed. Patient expressed understanding.  No further action required.

## 2017-11-13 ENCOUNTER — Ambulatory Visit: Payer: Medicare HMO | Admitting: Family Medicine

## 2017-11-13 VITALS — BP 110/68 | HR 79 | Temp 98.6°F | Wt 131.0 lb

## 2017-11-13 DIAGNOSIS — R05 Cough: Secondary | ICD-10-CM

## 2017-11-13 DIAGNOSIS — R059 Cough, unspecified: Secondary | ICD-10-CM

## 2017-11-13 MED ORDER — CEFDINIR 300 MG PO CAPS: 300.0000 mg | ORAL_CAPSULE | Freq: Two times a day (BID) | ORAL | 0 refills | Status: DC

## 2017-11-13 MED ORDER — GUAIFENESIN-CODEINE 100-10 MG/5ML PO SYRP: 10.0000 mL | ORAL_SOLUTION | Freq: Every evening | ORAL | 0 refills | Status: DC | PRN

## 2017-11-13 NOTE — Progress Notes (Signed)
Courtney Hood is a 71 y.o. female here for an acute visit.  History of Present Illness:   Cough  This is a new problem. The current episode started 1 to 4 weeks ago. The problem has been gradually worsening. The cough is productive of sputum. Associated symptoms include chills, headaches, nasal congestion and postnasal drip. Pertinent negatives include no chest pain, shortness of breath or wheezing. The symptoms are aggravated by lying down. She has tried OTC cough suppressant and rest for the symptoms. The treatment provided mild relief.   PMHx, SurgHx, SocialHx, Medications, and Allergies were reviewed in the Visit Navigator and updated as appropriate.  Current Medications:   .  buPROPion (WELLBUTRIN XL) 150 MG 24 hr tablet, TAKE ONE TABLET BY MOUTH EVERY MORNING, Disp: 90 tablet, Rfl: 0 .  Cholecalciferol 4000 units CAPS, Take one tablet daily. (Patient taking differently: Take 2,000 Units by mouth daily. Take one tablet daily.), Disp: 30 capsule, Rfl: 0 .  ibuprofen (ADVIL,MOTRIN) 200 MG tablet, Take 200 mg by mouth as needed., Disp: , Rfl:  .  ondansetron (ZOFRAN) 4 MG tablet, Take 1 tablet (4 mg total) by mouth every 8 (eight) hours as needed for nausea or vomiting., Disp: 10 tablet, Rfl: 0 .  pantoprazole (PROTONIX) 40 MG tablet, Take 1 tablet (40 mg total) by mouth daily., Disp: 90 tablet, Rfl: 3 .  pravastatin (PRAVACHOL) 40 MG tablet, TAKE ONE TABLET BY MOUTH DAILY, Disp: 90 tablet, Rfl: 1 .  scopolamine (TRANSDERM-SCOP, 1.5 MG,) 1 MG/3DAYS, Place 1 patch (1.5 mg total) onto the skin every 3 (three) days., Disp: 10 patch, Rfl: 0 .  sertraline (ZOLOFT) 100 MG tablet, Take 1 tablet (100 mg total) by mouth daily. (Patient taking differently: Take 50 mg by mouth daily. ), Disp: 90 tablet, Rfl: 3 .  traMADol (ULTRAM) 50 MG tablet, Take 1 tablet (50 mg total) by mouth every 6 (six) hours as needed., Disp: 15 tablet, Rfl: 0   Allergies  Allergen Reactions  . Actonel [Risedronate  Sodium] Rash   Review of Systems:   Pertinent items are noted in the HPI. Otherwise, ROS is negative.  Vitals:   Vitals:   11/13/17 1436  BP: 110/68  Pulse: 79  Temp: 98.6 F (37 C)  TempSrc: Oral  SpO2: 96%  Weight: 131 lb (59.4 kg)     Body mass index is 20.52 kg/m.   Physical Exam:   Physical Exam  Constitutional: She appears well-nourished.  HENT:  Head: Normocephalic and atraumatic.  Nose: Rhinorrhea present. Right sinus exhibits maxillary sinus tenderness and frontal sinus tenderness.  Eyes: EOM are normal. Pupils are equal, round, and reactive to light.  Neck: Normal range of motion. Neck supple.  Cardiovascular: Normal rate, regular rhythm, normal heart sounds and intact distal pulses.  Pulmonary/Chest: Effort normal. She has rhonchi.  Abdominal: Soft.  Skin: Skin is warm.  Psychiatric: She has a normal mood and affect. Her behavior is normal.  Nursing note and vitals reviewed.   Assessment and Plan:   Diagnoses and all orders for this visit:  Cough -     cefdinir (OMNICEF) 300 MG capsule; Take 1 capsule (300 mg total) by mouth 2 (two) times daily. -     guaiFENesin-codeine (CHERATUSSIN AC) 100-10 MG/5ML syrup; Take 10 mLs by mouth at bedtime as needed for cough or congestion.    . Reviewed expectations re: course of current medical issues. . Discussed self-management of symptoms. . Outlined signs and symptoms indicating need for more acute  intervention. . Patient verbalized understanding and all questions were answered. Marland Kitchen Health Maintenance issues including appropriate healthy diet, exercise, and smoking avoidance were discussed with patient. . See orders for this visit as documented in the electronic medical record. . Patient received an After Visit Summary.  Briscoe Deutscher, DO [place], [facility] 11/15/2017

## 2017-11-15 ENCOUNTER — Encounter: Payer: Self-pay | Admitting: Family Medicine

## 2017-12-03 ENCOUNTER — Other Ambulatory Visit: Payer: Self-pay | Admitting: Physician Assistant

## 2018-01-12 ENCOUNTER — Other Ambulatory Visit: Payer: Self-pay | Admitting: Physician Assistant

## 2018-03-16 ENCOUNTER — Ambulatory Visit: Payer: Medicare HMO | Admitting: Family Medicine

## 2018-03-16 ENCOUNTER — Encounter: Payer: Self-pay | Admitting: Family Medicine

## 2018-03-16 VITALS — BP 108/58 | HR 79 | Temp 98.0°F | Ht 67.0 in | Wt 126.2 lb

## 2018-03-16 DIAGNOSIS — L989 Disorder of the skin and subcutaneous tissue, unspecified: Secondary | ICD-10-CM | POA: Diagnosis not present

## 2018-03-16 DIAGNOSIS — R590 Localized enlarged lymph nodes: Secondary | ICD-10-CM

## 2018-03-16 DIAGNOSIS — E213 Hyperparathyroidism, unspecified: Secondary | ICD-10-CM

## 2018-03-16 DIAGNOSIS — R221 Localized swelling, mass and lump, neck: Secondary | ICD-10-CM

## 2018-03-16 DIAGNOSIS — F331 Major depressive disorder, recurrent, moderate: Secondary | ICD-10-CM | POA: Insufficient documentation

## 2018-03-16 LAB — CBC WITH DIFFERENTIAL/PLATELET
Basophils Absolute: 0 10*3/uL (ref 0.0–0.1)
Basophils Relative: 0.7 % (ref 0.0–3.0)
Eosinophils Absolute: 0.1 10*3/uL (ref 0.0–0.7)
Eosinophils Relative: 1.3 % (ref 0.0–5.0)
HCT: 44.7 % (ref 36.0–46.0)
Hemoglobin: 14.9 g/dL (ref 12.0–15.0)
Lymphocytes Relative: 29.8 % (ref 12.0–46.0)
Lymphs Abs: 1.5 10*3/uL (ref 0.7–4.0)
MCHC: 33.3 g/dL (ref 30.0–36.0)
MCV: 88.9 fl (ref 78.0–100.0)
Monocytes Absolute: 0.3 10*3/uL (ref 0.1–1.0)
Monocytes Relative: 6.4 % (ref 3.0–12.0)
Neutro Abs: 3.1 10*3/uL (ref 1.4–7.7)
Neutrophils Relative %: 61.8 % (ref 43.0–77.0)
Platelets: 226 10*3/uL (ref 150.0–400.0)
RBC: 5.03 Mil/uL (ref 3.87–5.11)
RDW: 13.2 % (ref 11.5–15.5)
WBC: 5 10*3/uL (ref 4.0–10.5)

## 2018-03-16 LAB — COMPREHENSIVE METABOLIC PANEL
ALT: 7 U/L (ref 0–35)
AST: 14 U/L (ref 0–37)
Albumin: 4.1 g/dL (ref 3.5–5.2)
Alkaline Phosphatase: 73 U/L (ref 39–117)
BUN: 20 mg/dL (ref 6–23)
CO2: 31 mEq/L (ref 19–32)
Calcium: 10.5 mg/dL (ref 8.4–10.5)
Chloride: 104 mEq/L (ref 96–112)
Creatinine, Ser: 0.84 mg/dL (ref 0.40–1.20)
GFR: 71.19 mL/min (ref >60.00)
Glucose, Bld: 100 mg/dL — ABNORMAL HIGH (ref 70–99)
Potassium: 3.9 mEq/L (ref 3.5–5.1)
Sodium: 140 mEq/L (ref 135–145)
Total Bilirubin: 0.4 mg/dL (ref 0.2–1.2)
Total Protein: 6.8 g/dL (ref 6.0–8.3)

## 2018-03-16 LAB — SEDIMENTATION RATE: Sed Rate: 18 mm/hr (ref 0–30)

## 2018-03-16 LAB — C-REACTIVE PROTEIN: CRP: 0.6 mg/dL (ref 0.5–20.0)

## 2018-03-16 MED ORDER — DOXYCYCLINE HYCLATE 100 MG PO TABS: 100.0000 mg | ORAL_TABLET | Freq: Two times a day (BID) | ORAL | 0 refills | Status: DC

## 2018-03-16 NOTE — Progress Notes (Signed)
Courtney Hood is a 72 y.o. female here for an acute visit.  History of Present Illness:   Lonell Grandchild, CMA acting as scribe for Dr. Briscoe Deutscher.   HPI: Patient noticed small knot on left side of scalp on Thursday. It has grown in size and gotten larger. She has not noticed any discharge but neck is know swollen and tender to touch. No fever, nasua or vomiting.    PMHx, SurgHx, SocialHx, Medications, and Allergies were reviewed in the Visit Navigator and updated as appropriate.  Current Medications:   .  buPROPion (WELLBUTRIN XL) 150 MG 24 hr tablet, TAKE ONE TABLET BY MOUTH EVERY MORNING, Disp: 90 tablet, Rfl: 0 .  Cholecalciferol 4000 units CAPS, Take one tablet daily. (Patient taking differently: Take 2,000 Units by mouth daily. Take one tablet daily.), Disp: 30 capsule, Rfl: 0 .  pantoprazole (PROTONIX) 40 MG tablet, Take 1 tablet (40 mg total) by mouth daily., Disp: 90 tablet, Rfl: 3 .  pravastatin (PRAVACHOL) 40 MG tablet, TAKE ONE TABLET BY MOUTH DAILY, Disp: 90 tablet, Rfl: 0 .  sertraline (ZOLOFT) 100 MG tablet, TAKE ONE TABLET BY MOUTH DAILY, Disp: 90 tablet, Rfl: 3   Allergies  Allergen Reactions  . Actonel [Risedronate Sodium] Rash   Review of Systems:   Pertinent items are noted in the HPI. Otherwise, ROS is negative.  Vitals:   Vitals:   03/16/18 1334  BP: (!) 108/58  Pulse: 79  Temp: 98 F (36.7 C)  TempSrc: Oral  SpO2: 96%  Weight: 126 lb 3.2 oz (57.2 kg)  Height: 5\' 7"  (1.702 m)     Body mass index is 19.77 kg/m.  Physical Exam:   Physical Exam  Constitutional: She appears well-nourished.  HENT:  Head: Normocephalic and atraumatic.  Eyes: Pupils are equal, round, and reactive to light. EOM are normal.  Neck: Normal range of motion. Neck supple.  Cardiovascular: Normal rate, regular rhythm, normal heart sounds and intact distal pulses.  Pulmonary/Chest: Effort normal.  Abdominal: Soft.  Skin: Skin is warm.  3 mm irritated nodule on  left parietal area.  No open lesion or drainage.  She has some postauricular lymphadenopathy and posterior cervical lymphadenopathy associated with this.  Both are tender and swollen.  Psychiatric: She has a normal mood and affect. Her behavior is normal.  Nursing note and vitals reviewed.   Assessment and Plan:   1. Scalp lesion - CBC with Differential/Platelet - Comprehensive metabolic panel - Sedimentation rate - C-reactive protein - doxycycline (VIBRA-TABS) 100 MG tablet; Take 1 tablet (100 mg total) by mouth 2 (two) times daily.  Dispense: 20 tablet; Refill: 0  2. Lymphadenopathy of head and neck region - CBC with Differential/Platelet - Comprehensive metabolic panel - Sedimentation rate - C-reactive protein - doxycycline (VIBRA-TABS) 100 MG tablet; Take 1 tablet (100 mg total) by mouth 2 (two) times daily.  Dispense: 20 tablet; Refill: 0  3. Moderate episode of recurrent major depressive disorder ([place])  4. Hyperparathyroidism ([place])  . Reviewed expectations re: course of current medical issues. . Discussed self-management of symptoms. . Outlined signs and symptoms indicating need for more acute intervention. . Patient verbalized understanding and all questions were answered. Marland Kitchen Health Maintenance issues including appropriate healthy diet, exercise, and smoking avoidance were discussed with patient. . See orders for this visit as documented in the electronic medical record. . Patient received an After Visit Summary.  CMA served as Education administrator during this visit. History, Physical, and Plan performed by medical provider. The  above documentation has been reviewed and is accurate and complete. Briscoe Deutscher, D.O.  Briscoe Deutscher, DO [place], [facility] 03/16/2018

## 2018-04-07 ENCOUNTER — Other Ambulatory Visit: Payer: Self-pay | Admitting: Physician Assistant

## 2018-05-16 ENCOUNTER — Other Ambulatory Visit: Payer: Self-pay | Admitting: Physician Assistant

## 2018-05-25 DIAGNOSIS — Z1231 Encounter for screening mammogram for malignant neoplasm of breast: Secondary | ICD-10-CM | POA: Diagnosis not present

## 2018-05-25 LAB — HM MAMMOGRAPHY

## 2018-05-30 ENCOUNTER — Other Ambulatory Visit: Payer: Self-pay | Admitting: Physician Assistant

## 2018-06-08 ENCOUNTER — Encounter: Payer: Self-pay | Admitting: Surgical

## 2018-07-15 ENCOUNTER — Other Ambulatory Visit: Payer: Self-pay | Admitting: Family Medicine

## 2018-10-05 ENCOUNTER — Encounter: Payer: Self-pay | Admitting: Physician Assistant

## 2018-10-05 ENCOUNTER — Ambulatory Visit (INDEPENDENT_AMBULATORY_CARE_PROVIDER_SITE_OTHER): Payer: Medicare HMO | Admitting: Physician Assistant

## 2018-10-05 VITALS — BP 110/62 | HR 68 | Temp 98.1°F | Ht 67.0 in | Wt 128.2 lb

## 2018-10-05 DIAGNOSIS — R300 Dysuria: Secondary | ICD-10-CM | POA: Diagnosis not present

## 2018-10-05 LAB — POCT URINALYSIS DIPSTICK
GLUCOSE UA: NEGATIVE
Nitrite, UA: NEGATIVE
Protein, UA: NEGATIVE
Urobilinogen, UA: 0.2 E.U./dL
pH, UA: 6 (ref 5.0–8.0)

## 2018-10-05 MED ORDER — CEPHALEXIN 500 MG PO CAPS: 500.0000 mg | ORAL_CAPSULE | Freq: Three times a day (TID) | ORAL | 0 refills | Status: AC

## 2018-10-05 NOTE — Patient Instructions (Signed)
It was great to see you!  Start the oral antibiotic. We will contact you with your culture results.  Take care,  [name] PA-C   Urinary Tract Infection, Adult A urinary tract infection (UTI) is an infection of any part of the urinary tract. The urinary tract includes the:  Kidneys.  Ureters.  Bladder.  Urethra.  These organs make, store, and get rid of pee (urine) in the body. Follow these instructions at home:  Take over-the-counter and prescription medicines only as told by your doctor.  If you were prescribed an antibiotic medicine, take it as told by your doctor. Do not stop taking the antibiotic even if you start to feel better.  Avoid the following drinks: ? Alcohol. ? Caffeine. ? Tea. ? Carbonated drinks.  Drink enough fluid to keep your pee clear or pale yellow.  Keep all follow-up visits as told by your doctor. This is important.  Make sure to: ? Empty your bladder often and completely. Do not to hold pee for long periods of time. ? Empty your bladder before and after sex. ? Wipe from front to back after a bowel movement if you are female. Use each tissue one time when you wipe. Contact a doctor if:  You have back pain.  You have a fever.  You feel sick to your stomach (nauseous).  You throw up (vomit).  Your symptoms do not get better after 3 days.  Your symptoms go away and then come back. Get help right away if:  You have very bad back pain.  You have very bad lower belly (abdominal) pain.  You are throwing up and cannot keep down any medicines or water. This information is not intended to replace advice given to you by your health care provider. Make sure you discuss any questions you have with your health care provider. Document Released: 05/06/2008 Document Revised: 04/25/2016 Document Reviewed: 10/09/2015 Elsevier Interactive Patient Education  [employer].

## 2018-10-05 NOTE — Progress Notes (Signed)
Courtney Hood is a 72 y.o. female here for a new problem.  I acted as a Education administrator for [employer], PA-C Courtney Pickler, LPN  History of Present Illness:   Chief Complaint  Patient presents with  . Urinary Tract Infection    Urinary Tract Infection   This is a new problem. Episode onset: Started on Friday. The problem occurs every urination. The problem has been gradually worsening. The quality of the pain is described as aching and burning. The pain is at a severity of 7/10. The pain is moderate. There has been no fever. She is not sexually active. There is a history of pyelonephritis. Associated symptoms include frequency and urgency. Pertinent negatives include no chills, discharge, flank pain, hematuria, nausea, possible pregnancy or vomiting. She has tried nothing for the symptoms. The treatment provided no relief.    Past Medical History:  Diagnosis Date  . Osteopenia of femoral neck, bilateral   . Primary hyperparathyroidism ([place])      Social History   Socioeconomic History  . Marital status: Married    Spouse name: Not on file  . Number of children: Not on file  . Years of education: Not on file  . Highest education level: Not on file  Occupational History  . Not on file  Social Needs  . Financial resource strain: Not on file  . Food insecurity:    Worry: Not on file    Inability: Not on file  . Transportation needs:    Medical: Not on file    Non-medical: Not on file  Tobacco Use  . Smoking status: Former Smoker    Last attempt to quit: 12/06/1999    Years since quitting: 18.8  . Smokeless tobacco: Never Used  . Tobacco comment: Married, lives wih spouse. Moved to [place] area from [place] in spring 2011  Substance and Sexual Activity  . Alcohol use: Yes    Comment: social-maybe 1 glass wine twice a month.   . Drug use: No  . Sexual activity: Not on file  Lifestyle  . Physical activity:    Days per week: Not on file    Minutes per session: Not on file  .  Stress: Not on file  Relationships  . Social connections:    Talks on phone: Not on file    Gets together: Not on file    Attends religious service: Not on file    Active member of club or organization: Not on file    Attends meetings of clubs or organizations: Not on file    Relationship status: Not on file  . Intimate partner violence:    Fear of current or ex partner: Not on file    Emotionally abused: Not on file    Physically abused: Not on file    Forced sexual activity: Not on file  Other Topics Concern  . Not on file  Social History Narrative   Goes by Courtney Hood.   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 ABDOMINAL HYSTERECTOMY    . APPENDECTOMY      Family History  Problem Relation Age of Onset  . Colon cancer Brother 65  . Leukemia Father   . Esophageal cancer Neg Hx   . Stomach cancer Neg Hx   . Rectal cancer Neg Hx     Allergies  Allergen Reactions  . Actonel [Risedronate Sodium] Rash    Current Medications:   Current Outpatient Medications:  .  buPROPion (WELLBUTRIN XL) 150 MG 24 hr tablet, TAKE ONE TABLET BY MOUTH EVERY MORNING, Disp: 90 tablet, Rfl: 0 .  Cholecalciferol 4000 units CAPS, Take one tablet daily. (Patient taking differently: Take 2,000 Units by mouth daily. Take one tablet daily.), Disp: 30 capsule, Rfl: 0 .  sertraline (ZOLOFT) 100 MG tablet, TAKE ONE TABLET BY MOUTH DAILY, Disp: 90 tablet, Rfl: 3 .  cephALEXin (KEFLEX) 500 MG capsule, Take 1 capsule (500 mg total) by mouth 3 (three) times daily for 5 days., Disp: 15 capsule, Rfl: 0 .  pravastatin (PRAVACHOL) 40 MG tablet, TAKE ONE TABLET BY MOUTH DAILY (Patient not taking: Reported on 10/05/2018), Disp: 90 tablet, Rfl: 0   Review of Systems:   Review of Systems  Constitutional: Negative for chills.  Gastrointestinal: Negative for nausea and  vomiting.  Genitourinary: Positive for frequency and urgency. Negative for flank pain and hematuria.    Vitals:   Vitals:   10/05/18 1250  BP: 110/62  Pulse: 68  Temp: 98.1 F (36.7 C)  TempSrc: Oral  SpO2: 97%  Weight: 128 lb 4 oz (58.2 kg)  Height: 5\' 7"  (1.702 m)     Body mass index is 20.09 kg/m.  Physical Exam:   Physical Exam  Constitutional: She appears well-developed. She is cooperative.  Non-toxic appearance. She does not have a sickly appearance. She does not appear ill. No distress.  Cardiovascular: Normal rate, regular rhythm, S1 normal, S2 normal, normal heart sounds and normal pulses.  Pulmonary/Chest: Effort normal and breath sounds normal.  Abdominal: Normal appearance and bowel sounds are normal. There is no tenderness. There is no CVA tenderness.  Neurological: She is alert. GCS eye subscore is 4. GCS verbal subscore is 5. GCS motor subscore is 6.  Skin: Skin is warm, dry and intact.  Psychiatric: She has a normal mood and affect. Her speech is normal and behavior is normal.  Nursing note and vitals reviewed.  Results for orders placed or performed in visit on 10/05/18  POCT urinalysis dipstick  Result Value Ref Range   Color, UA yellow    Clarity, UA cloudy    Glucose, UA Negative Negative   Bilirubin, UA 1+    Ketones, UA trace    Spec Grav, UA >=1.030 (A) 1.010 - 1.025   Blood, UA trace    pH, UA 6.0 5.0 - 8.0   Protein, UA Negative Negative   Urobilinogen, UA 0.2 0.2 or 1.0 E.U./dL   Nitrite, UA N    Leukocytes, UA Moderate (2+) (A) Negative   Appearance     Odor      Assessment and Plan:    Courtney Hood was seen today for urinary tract infection.  Diagnoses and all orders for this visit:  Dysuria -     POCT urinalysis dipstick -     Urine Culture  Other orders -     cephALEXin (KEFLEX) 500 MG capsule; Take 1 capsule (500 mg total) by mouth 3 (three) times daily for 5 days.   No red flags on exam. Suspect early acute cystitis. Urine  culture pending. Will treat with Keflex per orders.  Reviewed return precautions.  . Reviewed expectations re: course of current medical issues. . Discussed self-management of symptoms. . Outlined signs and symptoms indicating need for more acute intervention. . Patient verbalized understanding and all questions were answered. . See orders for this  visit as documented in the electronic medical record. . Patient received an After-Visit Summary.  CMA or LPN served as scribe during this visit. History, Physical, and Plan performed by medical provider. The above documentation has been reviewed and is accurate and complete.  [name], PA-C

## 2018-10-08 LAB — URINE CULTURE
MICRO NUMBER: 91324168
SPECIMEN QUALITY:: ADEQUATE

## 2018-10-19 ENCOUNTER — Other Ambulatory Visit: Payer: Self-pay | Admitting: Family Medicine

## 2018-11-17 IMAGING — US US ABDOMEN COMPLETE
1 series · 14 of 25 positions shown · non-contrast
Comparison: None.

CLINICAL DATA: Right upper quadrant pain.  Nausea.

EXAM:
ABDOMEN ULTRASOUND COMPLETE

[Series 1: us abdomen complete · 0.19mm/px · 14 of 76 slices shown]
[im 1/76]
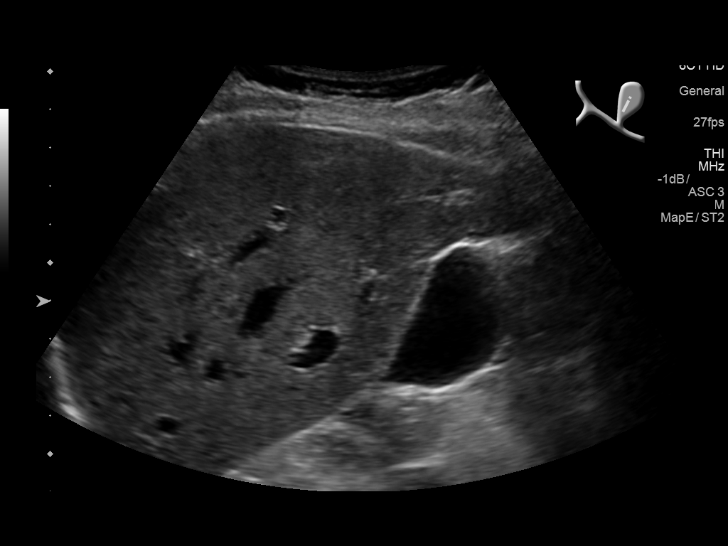
[im 7/76]
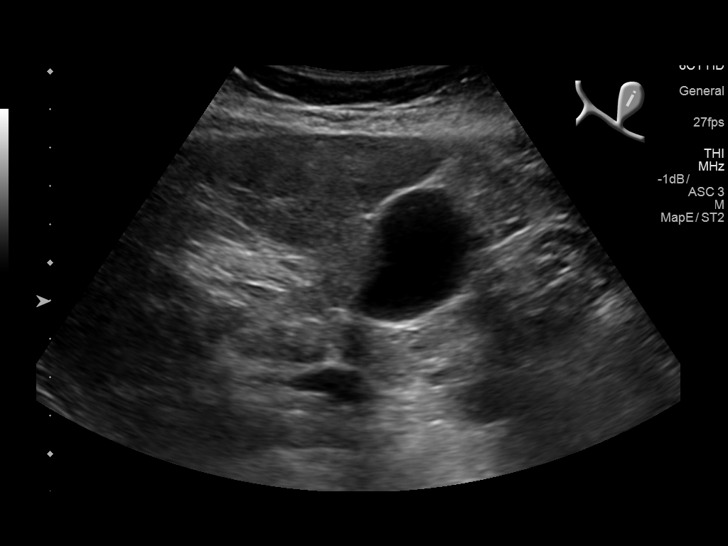
[im 13/76]
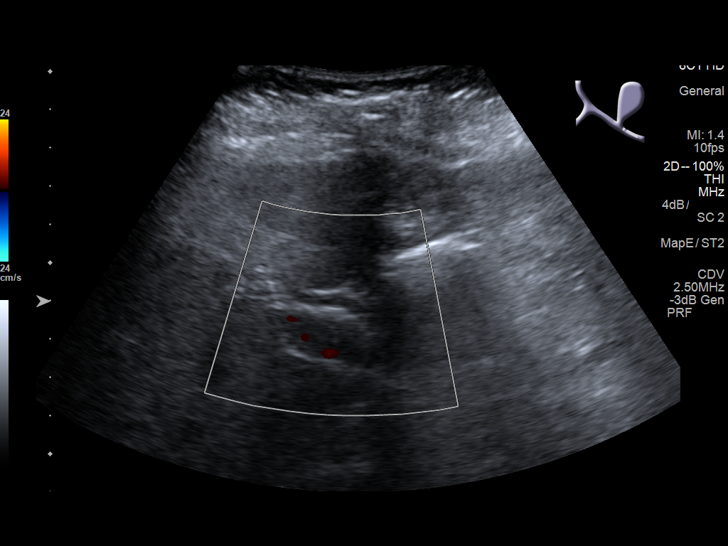
[im 19/76]
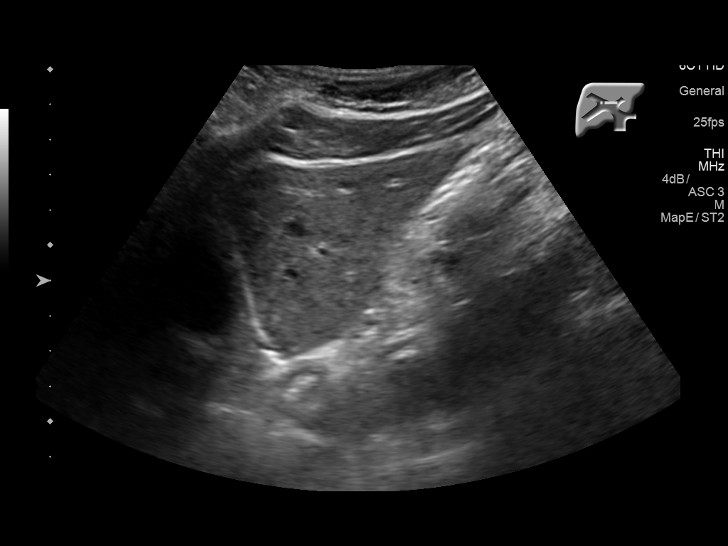
[im 26/76]
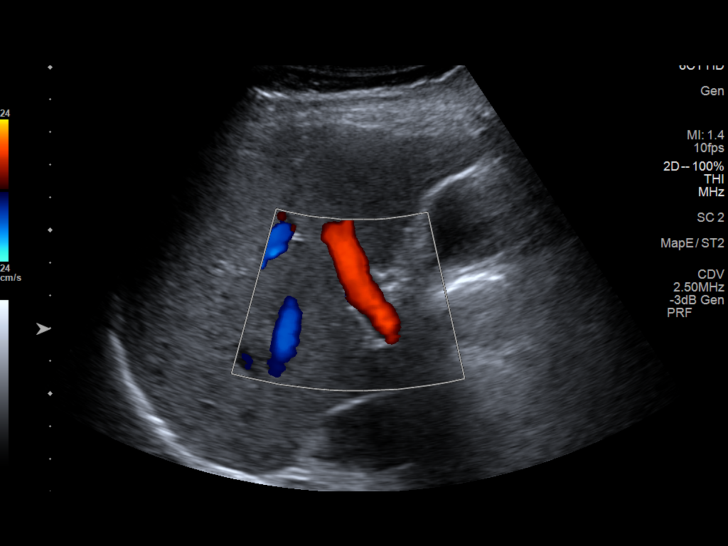
[im 29/76]
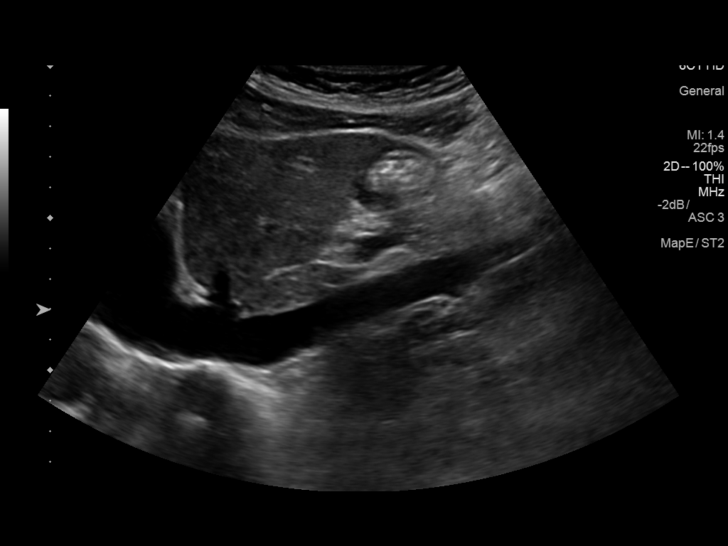
[im 35/76]
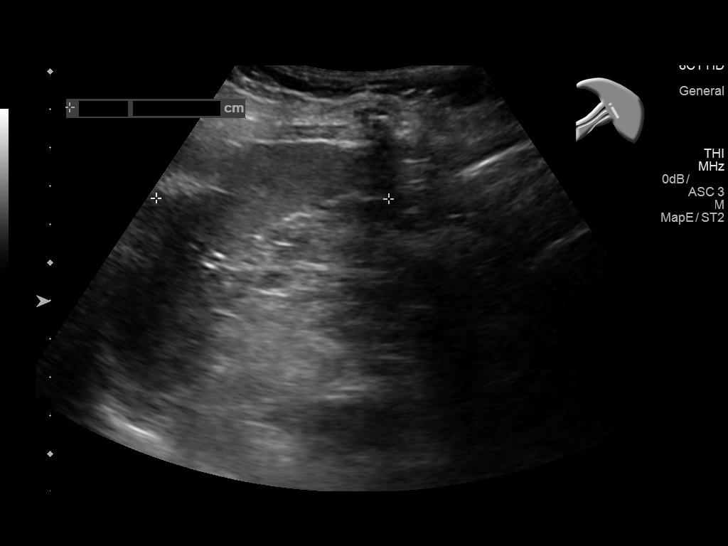
[im 41/76]
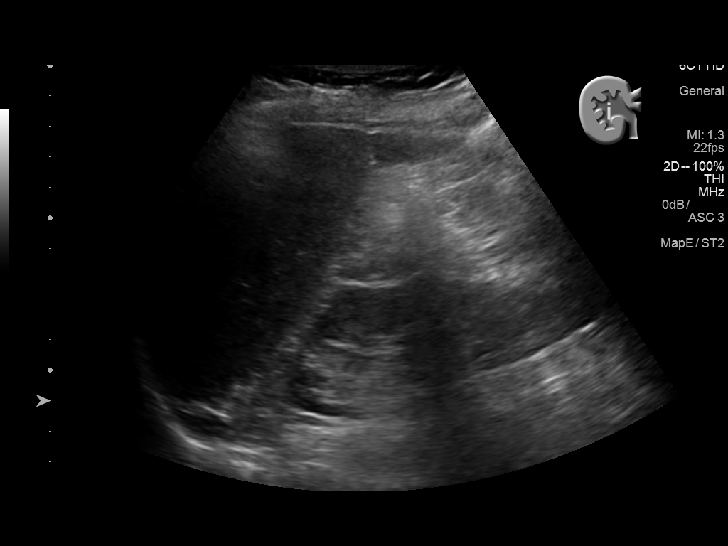
[im 47/76]
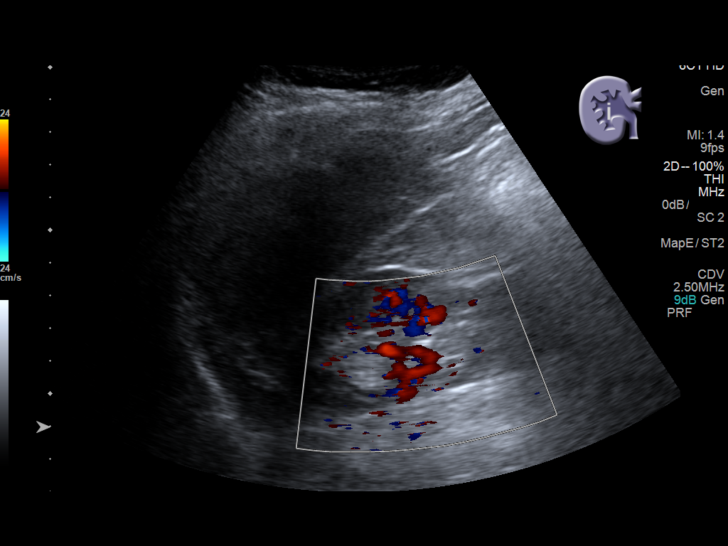
[im 51/76]
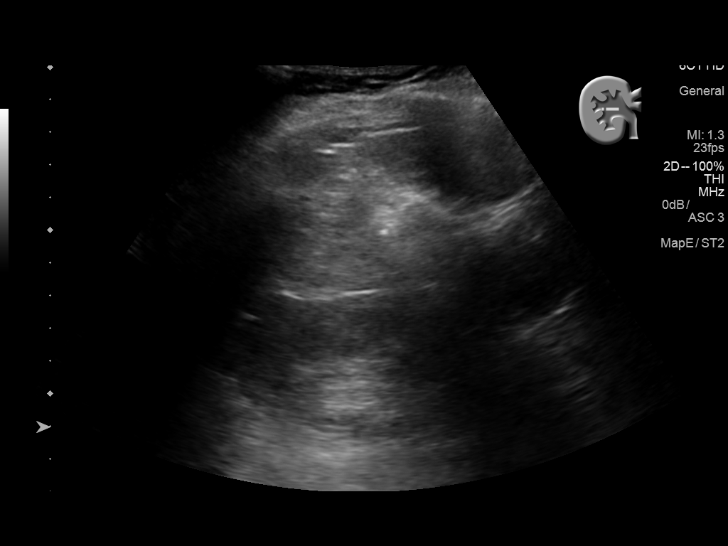
[im 57/76]
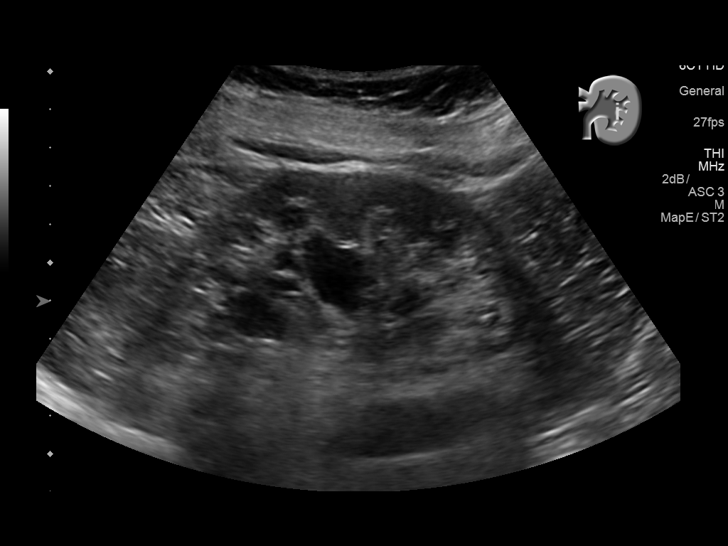
[im 63/76]
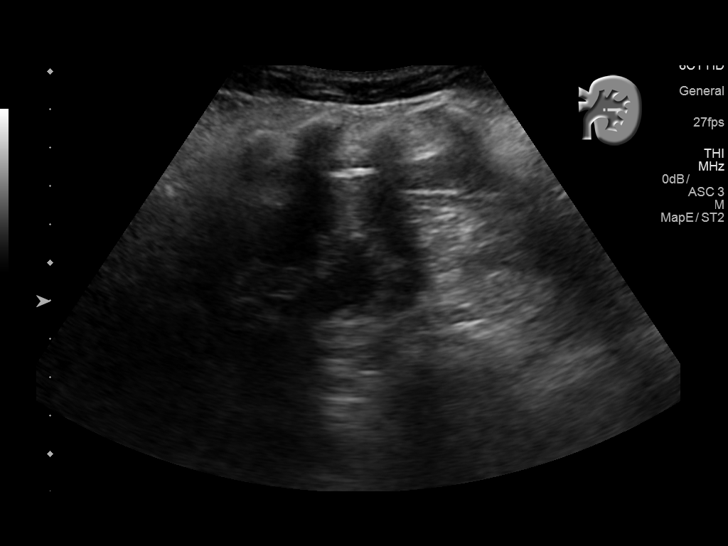
[im 69/76]
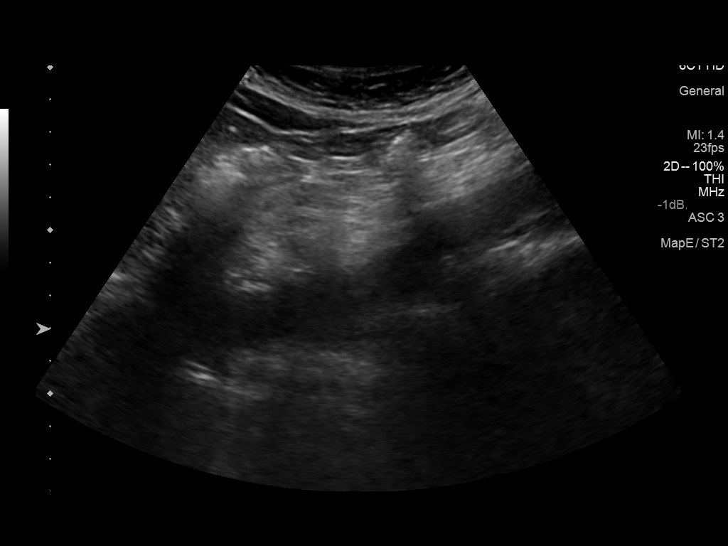
[im 76/76]
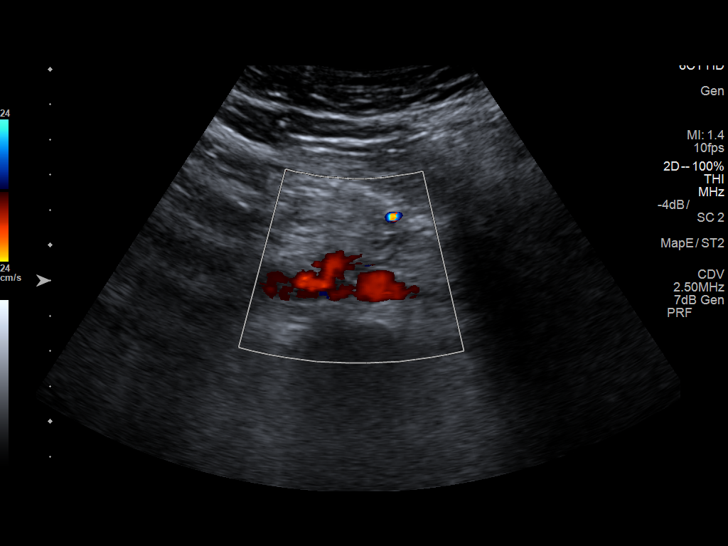

[14 of 25 positions shown; findings below may reference images not displayed]

FINDINGS: Gallbladder: No gallstones or wall thickening visualized. No
sonographic Murphy sign noted by sonographer.

Common bile duct: Diameter: 4 mm

Liver: No focal lesion identified. Within normal limits in
parenchymal echogenicity.

IVC: No abnormality visualized.

Pancreas: Visualized portion unremarkable.

Spleen: Size and appearance within normal limits.

Right Kidney: Length: 8.9 cm. Echogenicity within normal limits. No
mass or hydronephrosis visualized.

Left Kidney: Length: 8.8 cm. Mild parenchymal thinning. Mild
pelvicaliectasis.

Abdominal aorta: No aneurysm visualized.

Other findings: No ascites.
IMPRESSION: Mild pelvicaliectasis of the left kidney. Normal gallbladder and
bile ducts.

## 2019-01-18 ENCOUNTER — Other Ambulatory Visit: Payer: Self-pay | Admitting: Family Medicine

## 2019-01-18 ENCOUNTER — Other Ambulatory Visit: Payer: Self-pay | Admitting: Physician Assistant

## 2019-01-19 ENCOUNTER — Other Ambulatory Visit: Payer: Self-pay | Admitting: Family Medicine

## 2019-01-19 MED ORDER — BUPROPION HCL ER (XL) 150 MG PO TB24: 150.0000 mg | ORAL_TABLET | Freq: Every morning | ORAL | 0 refills | Status: DC

## 2019-01-19 NOTE — Telephone Encounter (Signed)
Copied from [place] #[id number]. Topic: Quick Communication - Rx Refill/Question >> Jan 19, 2019 10:51 AM Blase Mess A wrote: Medication: buPROPion (WELLBUTRIN XL) 150 MG 24 hr tablet [[id number]] Apt scheduled 11:00a 01/20/19  Has the patient contacted their pharmacy? Yes  (Agent: If no, request that the patient contact the pharmacy for the refill.) (Agent: If yes, when and what did the pharmacy advise?)  Preferred Pharmacy (with phone number or street name): [facility], [place], [place] - [address] [phone] (Phone) [phone] (Fax)    Agent: Please be advised that RX refills may take up to 3 business days. We ask that you follow-up with your pharmacy.

## 2019-01-19 NOTE — Telephone Encounter (Signed)
Requested Prescriptions  Pending Prescriptions Disp Refills  . buPROPion (WELLBUTRIN XL) 150 MG 24 hr tablet 15 tablet 0    Sig: Take 1 tablet (150 mg total) by mouth every morning.     Psychiatry: Antidepressants - bupropion Failed - 01/19/2019 10:53 AM      Failed - Valid encounter within last 6 months    Recent Outpatient Visits          3 months ago [place], [place], [place]   10 months ago Scalp lesion   [place], [place], DO   1 year ago Cough   [facility] [place], [place], [place]   1 year ago Generalized abdominal pain   [facility] [place], [place], [place]   1 year ago Generalized abdominal pain   [facility] [place], [place], [place]      Future Appointments            Tomorrow Juleen China, [place], [place], [place] - Completed PHQ-2 or PHQ-9 in the last 360 days.      Passed - Last BP in normal range    BP Readings from Last 1 Encounters:  10/05/18 110/62       overdue for office visit; gave courtesy refill of #15; no refills; scheduled for appt. 01/20/19

## 2019-01-19 NOTE — Telephone Encounter (Signed)
Called patient number needs f/u number d/c. Called emergency contact l/m to have patient call office. Last refill was sent to pharmacy with note that she needs f/u and it was not made.

## 2019-01-20 ENCOUNTER — Encounter: Payer: Self-pay | Admitting: Family Medicine

## 2019-01-20 ENCOUNTER — Ambulatory Visit (INDEPENDENT_AMBULATORY_CARE_PROVIDER_SITE_OTHER): Payer: Medicare HMO | Admitting: Family Medicine

## 2019-01-20 VITALS — BP 110/72 | HR 62 | Temp 98.7°F | Ht 67.0 in | Wt 128.0 lb

## 2019-01-20 DIAGNOSIS — E7800 Pure hypercholesterolemia, unspecified: Secondary | ICD-10-CM

## 2019-01-20 DIAGNOSIS — F331 Major depressive disorder, recurrent, moderate: Secondary | ICD-10-CM | POA: Diagnosis not present

## 2019-01-20 DIAGNOSIS — M8580 Other specified disorders of bone density and structure, unspecified site: Secondary | ICD-10-CM | POA: Diagnosis not present

## 2019-01-20 DIAGNOSIS — Z1211 Encounter for screening for malignant neoplasm of colon: Secondary | ICD-10-CM | POA: Diagnosis not present

## 2019-01-20 DIAGNOSIS — E213 Hyperparathyroidism, unspecified: Secondary | ICD-10-CM | POA: Diagnosis not present

## 2019-01-20 DIAGNOSIS — F3342 Major depressive disorder, recurrent, in full remission: Secondary | ICD-10-CM | POA: Diagnosis not present

## 2019-01-20 DIAGNOSIS — Z23 Encounter for immunization: Secondary | ICD-10-CM

## 2019-01-20 DIAGNOSIS — R5383 Other fatigue: Secondary | ICD-10-CM

## 2019-01-20 DIAGNOSIS — E538 Deficiency of other specified B group vitamins: Secondary | ICD-10-CM

## 2019-01-20 LAB — COMPREHENSIVE METABOLIC PANEL
ALT: 12 U/L (ref 0–35)
AST: 23 U/L (ref 0–37)
Albumin: 4.3 g/dL (ref 3.5–5.2)
Alkaline Phosphatase: 68 U/L (ref 39–117)
BUN: 18 mg/dL (ref 6–23)
CO2: 28 mEq/L (ref 19–32)
Calcium: 10.5 mg/dL (ref 8.4–10.5)
Chloride: 105 mEq/L (ref 96–112)
Creatinine, Ser: 0.88 mg/dL (ref 0.40–1.20)
GFR: 63.33 mL/min (ref >60.00)
Glucose, Bld: 80 mg/dL (ref 70–99)
Potassium: 4.4 mEq/L (ref 3.5–5.1)
Sodium: 141 mEq/L (ref 135–145)
Total Bilirubin: 0.4 mg/dL (ref 0.2–1.2)
Total Protein: 6.6 g/dL (ref 6.0–8.3)

## 2019-01-20 LAB — CBC WITH DIFFERENTIAL/PLATELET
Basophils Absolute: 0 10*3/uL (ref 0.0–0.1)
Basophils Relative: 0.6 % (ref 0.0–3.0)
Eosinophils Absolute: 0.1 10*3/uL (ref 0.0–0.7)
Eosinophils Relative: 1.8 % (ref 0.0–5.0)
HCT: 45.3 % (ref 36.0–46.0)
Hemoglobin: 15.1 g/dL — ABNORMAL HIGH (ref 12.0–15.0)
Lymphocytes Relative: 33 % (ref 12.0–46.0)
Lymphs Abs: 1.4 10*3/uL (ref 0.7–4.0)
MCHC: 33.2 g/dL (ref 30.0–36.0)
MCV: 91 fl (ref 78.0–100.0)
Monocytes Absolute: 0.4 10*3/uL (ref 0.1–1.0)
Monocytes Relative: 10.8 % (ref 3.0–12.0)
Neutro Abs: 2.2 10*3/uL (ref 1.4–7.7)
Neutrophils Relative %: 53.8 % (ref 43.0–77.0)
Platelets: 214 10*3/uL (ref 150.0–400.0)
RBC: 4.98 Mil/uL (ref 3.87–5.11)
RDW: 13.8 % (ref 11.5–15.5)
WBC: 4.1 10*3/uL (ref 4.0–10.5)

## 2019-01-20 LAB — LIPID PANEL
Cholesterol: 281 mg/dL — ABNORMAL HIGH (ref 0–200)
HDL: 72.6 mg/dL (ref >39.00)
LDL Cholesterol: 190 mg/dL — ABNORMAL HIGH (ref 0–99)
NonHDL: 208.46
Total CHOL/HDL Ratio: 4
Triglycerides: 90 mg/dL (ref 0.0–149.0)
VLDL: 18 mg/dL (ref 0.0–40.0)

## 2019-01-20 LAB — TSH: TSH: 2.2 u[IU]/mL (ref 0.35–4.50)

## 2019-01-20 LAB — VITAMIN B12: Vitamin B-12: 247 pg/mL (ref 211–911)

## 2019-01-20 MED ORDER — PRAVASTATIN SODIUM 40 MG PO TABS: 40.0000 mg | ORAL_TABLET | Freq: Every day | ORAL | 3 refills | Status: DC

## 2019-01-20 MED ORDER — SERTRALINE HCL 100 MG PO TABS: 100.0000 mg | ORAL_TABLET | Freq: Every day | ORAL | 3 refills | Status: DC

## 2019-01-20 MED ORDER — BUPROPION HCL ER (XL) 150 MG PO TB24: 150.0000 mg | ORAL_TABLET | Freq: Every day | ORAL | 3 refills | Status: DC

## 2019-01-20 NOTE — Progress Notes (Signed)
Courtney Hood is a 73 y.o. female is here for follow up.  History of Present Illness:   HPI: See Assessment and Plan section for Problem Based Charting of issues discussed today.   There are no preventive care reminders to display for this patient. Depression screen [facility] 2/9 01/20/2019 03/16/2018 06/24/2017  Decreased Interest 1 1 1   Down, Depressed, Hopeless 1 3 1   PHQ - 2 Score 2 4 2   Altered sleeping 1 1 1   Tired, decreased energy 0 0 1  Change in appetite 1 2 1   Feeling bad or failure about yourself  0 1 0  Trouble concentrating 0 0 0  Moving slowly or fidgety/restless 0 0 0  Suicidal thoughts 1 0 0  PHQ-9 Score 5 8 5   Difficult doing work/chores Somewhat difficult Somewhat difficult -   PMHx, SurgHx, SocialHx, FamHx, Medications, and Allergies were reviewed in the Visit Navigator and updated as appropriate.   Patient Active Problem List   Diagnosis Date Noted  . Moderate episode of recurrent major depressive disorder ([place]) 03/16/2018  . Supraspinatus tendon tear, left, initial encounter 03/07/2017  . Hair loss 01/30/2017  . B12 deficiency 08/15/2015  . Right lumbar radiculopathy 04/14/2015  . Primary hyperparathyroidism ([place]) 08/27/2010  . Hyperlipidemia 07/25/2010  . Major depressive disorder 07/25/2010  . Osteopenia of femoral neck, bilateral 07/25/2010   Social History   Tobacco Use  . Smoking status: Former Smoker    Last attempt to quit: 12/06/1999    Years since quitting: 19.1  . Smokeless tobacco: Never Used  . Tobacco comment: Married, lives wih spouse. Moved to [place] area from [place] in spring 2011  Substance Use Topics  . Alcohol use: Yes    Comment: social-maybe 1 glass wine twice a month.   . Drug use: No   Current Medications and Allergies   Current Outpatient Medications:  .  buPROPion (WELLBUTRIN XL) 150 MG 24 hr tablet, Take 1 tablet (150 mg total) by mouth daily., Disp: 90 tablet, Rfl: 3 .  Cholecalciferol 4000 units CAPS, Take one tablet  daily. (Patient taking differently: Take 2,000 Units by mouth daily. Take one tablet daily.), Disp: 30 capsule, Rfl: 0 .  pravastatin (PRAVACHOL) 40 MG tablet, Take 1 tablet (40 mg total) by mouth daily., Disp: 90 tablet, Rfl: 3 .  sertraline (ZOLOFT) 100 MG tablet, Take 1 tablet (100 mg total) by mouth daily., Disp: 90 tablet, Rfl: 3   Allergies  Allergen Reactions  . Actonel [Risedronate Sodium] Rash   Review of Systems   Pertinent items are noted in the HPI. Otherwise, a complete ROS is negative.  Vitals   Vitals:   01/20/19 1112  BP: 110/72  Pulse: 62  Temp: 98.7 F (37.1 C)  TempSrc: Oral  SpO2: 94%  Weight: 128 lb (58.1 kg)  Height: 5\' 7"  (1.702 m)     Body mass index is 20.05 kg/m.  Physical Exam   Physical Exam Vitals signs and nursing note reviewed.  HENT:     Head: Normocephalic and atraumatic.  Eyes:     Pupils: Pupils are equal, round, and reactive to light.  Neck:     Musculoskeletal: Normal range of motion and neck supple.  Cardiovascular:     Rate and Rhythm: Normal rate and regular rhythm.     Heart sounds: Normal heart sounds.  Pulmonary:     Effort: Pulmonary effort is normal.  Abdominal:     Palpations: Abdomen is soft.  Skin:    General: Skin  is warm.  Psychiatric:        Behavior: Behavior normal.     Results for orders placed or performed in visit on 01/20/19  CBC with Differential/Platelet  Result Value Ref Range   WBC 4.1 4.0 - 10.5 K/uL   RBC 4.98 3.87 - 5.11 Mil/uL   Hemoglobin 15.1 (H) 12.0 - 15.0 g/dL   HCT 45.3 36.0 - 46.0 %   MCV 91.0 78.0 - 100.0 fl   MCHC 33.2 30.0 - 36.0 g/dL   RDW 13.8 11.5 - 15.5 %   Platelets 214.0 150.0 - 400.0 K/uL   Neutrophils Relative % 53.8 43.0 - 77.0 %   Lymphocytes Relative 33.0 12.0 - 46.0 %   Monocytes Relative 10.8 3.0 - 12.0 %   Eosinophils Relative 1.8 0.0 - 5.0 %   Basophils Relative 0.6 0.0 - 3.0 %   Neutro Abs 2.2 1.4 - 7.7 K/uL   Lymphs Abs 1.4 0.7 - 4.0 K/uL   Monocytes  Absolute 0.4 0.1 - 1.0 K/uL   Eosinophils Absolute 0.1 0.0 - 0.7 K/uL   Basophils Absolute 0.0 0.0 - 0.1 K/uL  Comprehensive metabolic panel  Result Value Ref Range   Sodium 141 135 - 145 mEq/L   Potassium 4.4 3.5 - 5.1 mEq/L   Chloride 105 96 - 112 mEq/L   CO2 28 19 - 32 mEq/L   Glucose, Bld 80 70 - 99 mg/dL   BUN 18 6 - 23 mg/dL   Creatinine, Ser 0.88 0.40 - 1.20 mg/dL   Total Bilirubin 0.4 0.2 - 1.2 mg/dL   Alkaline Phosphatase 68 39 - 117 U/L   AST 23 0 - 37 U/L   ALT 12 0 - 35 U/L   Total Protein 6.6 6.0 - 8.3 g/dL   Albumin 4.3 3.5 - 5.2 g/dL   Calcium 10.5 8.4 - 10.5 mg/dL   GFR 63.33 >60.00 mL/min  Lipid panel  Result Value Ref Range   Cholesterol 281 (H) 0 - 200 mg/dL   Triglycerides 90.0 0.0 - 149.0 mg/dL   HDL 72.60 >39.00 mg/dL   VLDL 18.0 0.0 - 40.0 mg/dL   LDL Cholesterol 190 (H) 0 - 99 mg/dL   Total CHOL/HDL Ratio 4    NonHDL 208.46   TSH  Result Value Ref Range   TSH 2.20 0.35 - 4.50 uIU/mL  PTH, Intact and Calcium  Result Value Ref Range   PTH 62 14 - 64 pg/mL   Calcium 10.5 (H) 8.6 - 10.4 mg/dL  Vitamin B12  Result Value Ref Range   Vitamin B-12 247 211 - 911 pg/mL    Assessment and Plan   Saman was seen today for medication refill.  Diagnoses and all orders for this visit:  Pure hypercholesterolemia Comments: Tolerating medication without side effects. Due for labs. Continue. Orders: -     pravastatin (PRAVACHOL) 40 MG tablet; Take 1 tablet (40 mg total) by mouth daily. -     Comprehensive metabolic panel -     Lipid panel  Hyperparathyroidism ([place]) Comments: Will check labs today. Orders: -     Comprehensive metabolic panel -     PTH, Intact and Calcium  B12 deficiency -     CBC with Differential/Platelet -     Vitamin B12  Need for prophylactic vaccination and inoculation against influenza -     Flu Vaccine QUAD 6+ mos PF IM (Fluarix Quad PF)  Screening for colon cancer -     Ambulatory referral to  Gastroenterology  Osteopenia, unspecified location -     DG Bone Density; Future  Recurrent major depressive disorder, in full remission ([place]) Comments: Stable. Continue current treatment.  Orders: -     buPROPion (WELLBUTRIN XL) 150 MG 24 hr tablet; Take 1 tablet (150 mg total) by mouth daily. -     sertraline (ZOLOFT) 100 MG tablet; Take 1 tablet (100 mg total) by mouth daily.  Fatigue, unspecified type -     TSH    . Orders and follow up as documented in [place], reviewed diet, exercise and weight control, cardiovascular risk and specific lipid/LDL goals reviewed, reviewed medications and side effects in detail.  . Reviewed expectations re: course of current medical issues. . Outlined signs and symptoms indicating need for more acute intervention. . Patient verbalized understanding and all questions were answered. . Patient received an After Visit Summary.  Briscoe Deutscher, DO [place] 01/28/2019

## 2019-01-21 LAB — PTH, INTACT AND CALCIUM
Calcium: 10.5 mg/dL — ABNORMAL HIGH (ref 8.6–10.4)
PTH: 62 pg/mL (ref 14–64)

## 2019-01-28 ENCOUNTER — Encounter: Payer: Self-pay | Admitting: Family Medicine

## 2019-04-09 ENCOUNTER — Encounter: Payer: Self-pay | Admitting: Gastroenterology

## 2019-07-13 DIAGNOSIS — Z1231 Encounter for screening mammogram for malignant neoplasm of breast: Secondary | ICD-10-CM | POA: Diagnosis not present

## 2019-07-13 LAB — HM MAMMOGRAPHY

## 2019-07-28 ENCOUNTER — Telehealth: Payer: Self-pay | Admitting: Family Medicine

## 2019-07-28 NOTE — Telephone Encounter (Signed)
I called the patient to schedule AWV with Courtney, but there was no answer and no option to leave a message. VDM (Dee-Dee) °

## 2019-08-11 ENCOUNTER — Other Ambulatory Visit: Payer: Self-pay

## 2019-08-11 ENCOUNTER — Encounter: Payer: Self-pay | Admitting: Physician Assistant

## 2019-08-11 ENCOUNTER — Ambulatory Visit (INDEPENDENT_AMBULATORY_CARE_PROVIDER_SITE_OTHER): Payer: Medicare HMO | Admitting: Physician Assistant

## 2019-08-11 VITALS — BP 116/70 | HR 85 | Temp 97.8°F | Ht 67.0 in | Wt 128.5 lb

## 2019-08-11 DIAGNOSIS — L0291 Cutaneous abscess, unspecified: Secondary | ICD-10-CM

## 2019-08-11 MED ORDER — MUPIROCIN 2 % EX OINT: TOPICAL_OINTMENT | CUTANEOUS | 0 refills | Status: DC

## 2019-08-11 NOTE — Patient Instructions (Signed)
It was great to see you!  Apply the bactroban ointment 1-2 times daily. Warm sitz baths daily may help draw some more stuff out.  If you feel like it is not improving or have other concerns, give [place] a call!  Take care,  [name] PA-C

## 2019-08-11 NOTE — Progress Notes (Signed)
Courtney Hood is a 73 y.o. female here for a new problem.  I acted as a Education administrator for [employer], PA-C [employer], LPN  History of Present Illness:   Chief Complaint  Patient presents with  . ingrown hair    HPI   Ingrown hair Pt c/o ingrown hair follicle left groin area x 6 months. She trims her bikini line for summer. She has had two prior episodes where the area drained after she expressed it. She was unable to express this one and is here for further eval/treatment. Area is tender to touch.  Denies: fevers, chills, vomiting, nausea    Past Medical History:  Diagnosis Date  . Osteopenia of femoral neck, bilateral   . Primary hyperparathyroidism ([place])      Social History   Socioeconomic History  . Marital status: Married    Spouse name: Not on file  . Number of children: Not on file  . Years of education: Not on file  . Highest education level: Not on file  Occupational History  . Not on file  Social Needs  . Financial resource strain: Not on file  . Food insecurity    Worry: Not on file    Inability: Not on file  . Transportation needs    Medical: Not on file    Non-medical: Not on file  Tobacco Use  . Smoking status: Former Smoker    Quit date: 12/06/1999    Years since quitting: 19.6  . Smokeless tobacco: Never Used  . Tobacco comment: Married, lives wih spouse. Moved to [place] area from [place] in spring 2011  Substance and Sexual Activity  . Alcohol use: Yes    Comment: social-maybe 1 glass wine twice a month.   . Drug use: No  . Sexual activity: Not on file  Lifestyle  . Physical activity    Days per week: Not on file    Minutes per session: Not on file  . Stress: Not on file  Relationships  . Social Herbalist on phone: Not on file    Gets together: Not on file    Attends religious service: Not on file    Active member of club or organization: Not on file    Attends meetings of clubs or organizations: Not on file   Relationship status: Not on file  . Intimate partner violence    Fear of current or ex partner: Not on file    Emotionally abused: Not on file    Physically abused: Not on file    Forced sexual activity: Not on file  Other Topics Concern  . Not on file  Social History Narrative   Goes by [employer].   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 ABDOMINAL HYSTERECTOMY    . APPENDECTOMY      Family History  Problem Relation Age of Onset  . Colon cancer Brother 40  . Leukemia Father   . Esophageal cancer Neg Hx   . Stomach cancer Neg Hx   . Rectal cancer Neg Hx     Allergies  Allergen Reactions  . Actonel [Risedronate Sodium] Rash    Current Medications:   Current Outpatient Medications:  .  buPROPion (WELLBUTRIN XL) 150 MG 24 hr tablet, Take 1 tablet (150 mg total) by mouth daily., Disp: 90  tablet, Rfl: 3 .  Cholecalciferol 4000 units CAPS, Take one tablet daily. (Patient taking differently: Take 2,000 Units by mouth daily. Take one tablet daily.), Disp: 30 capsule, Rfl: 0 .  pravastatin (PRAVACHOL) 40 MG tablet, Take 1 tablet (40 mg total) by mouth daily., Disp: 90 tablet, Rfl: 3 .  sertraline (ZOLOFT) 100 MG tablet, Take 1 tablet (100 mg total) by mouth daily. (Patient taking differently: Take 50 mg by mouth daily. ), Disp: 90 tablet, Rfl: 3 .  mupirocin ointment (BACTROBAN) 2 %, Apply to affected area 1-2 times daily, Disp: 22 g, Rfl: 0   Review of Systems:   ROS  Negative unless otherwise specified per HPI.   Vitals:   Vitals:   08/11/19 1348  BP: 116/70  Pulse: 85  Temp: 97.8 F (36.6 C)  TempSrc: Temporal  SpO2: 95%  Weight: 128 lb 8 oz (58.3 kg)  Height: 5\' 7"  (1.702 m)     Body mass index is 20.13 kg/m.  Physical Exam:   Physical Exam Constitutional:      Appearance: She is well-developed.  HENT:     Head:  Normocephalic and atraumatic.  Eyes:     Conjunctiva/sclera: Conjunctivae normal.  Neck:     Musculoskeletal: Normal range of motion and neck supple.  Pulmonary:     Effort: Pulmonary effort is normal.  Musculoskeletal: Normal range of motion.  Skin:    General: Skin is warm and dry.     Comments: Dime-sized erythematous lesion with induration and white central area in L inguinal fold; tenderness to palpation; no active discharge  Neurological:     Mental Status: She is alert and oriented to person, place, and time.  Psychiatric:        Behavior: Behavior normal.        Thought Content: Thought content normal.        Judgment: Judgment normal.    I&D Procedure Note Meds, vitals, and allergies reviewed.  Indication: suspect abscess Pt complaints of: erythema, pain, swelling  Location: L inguinal area  Size: approximately dime-sized  Informed consent obtained.  Pt aware of risks not limited to but including infection, bleeding, damage to near by organs.  Prep: etoh/betadine  Anesthesia: 1%lidocaine with epi, good effect  Incision made with #11 blade  Wound explored and loculations removed  Tolerated well     Assessment and Plan:   Eulonda was seen today for ingrown hair.  Diagnoses and all orders for this visit:  Abscess  Other orders -     mupirocin ointment (BACTROBAN) 2 %; Apply to affected area 1-2 times daily   Relief of symptoms experienced with procedure. No red flags. Mupirocin ointment given to apply to affected area. Likely will need general surgery as this is recurrent, she will let us know if she would like to pursue this. Red flags discussed.   . Reviewed expectations re: course of current medical issues. . Discussed self-management of symptoms. . Outlined signs and symptoms indicating need for more acute intervention. . Patient verbalized understanding and all questions were answered. . See orders for this visit as documented in the  electronic medical record. . Patient received an After-Visit Summary.  CMA or LPN served as scribe during this visit. History, Physical, and Plan performed by medical provider. The above documentation has been reviewed and is accurate and complete.   [name], PA-C

## 2019-10-08 ENCOUNTER — Other Ambulatory Visit: Payer: Self-pay

## 2019-10-08 ENCOUNTER — Ambulatory Visit (INDEPENDENT_AMBULATORY_CARE_PROVIDER_SITE_OTHER): Payer: Medicare HMO | Admitting: Family Medicine

## 2019-10-08 ENCOUNTER — Encounter: Payer: Self-pay | Admitting: Family Medicine

## 2019-10-08 VITALS — BP 100/58 | HR 110 | Temp 97.7°F | Ht 67.0 in | Wt 125.0 lb

## 2019-10-08 DIAGNOSIS — R140 Abdominal distension (gaseous): Secondary | ICD-10-CM

## 2019-10-08 DIAGNOSIS — N390 Urinary tract infection, site not specified: Secondary | ICD-10-CM | POA: Diagnosis not present

## 2019-10-08 DIAGNOSIS — R197 Diarrhea, unspecified: Secondary | ICD-10-CM | POA: Diagnosis not present

## 2019-10-08 DIAGNOSIS — R8290 Unspecified abnormal findings in urine: Secondary | ICD-10-CM | POA: Diagnosis not present

## 2019-10-08 DIAGNOSIS — Z87448 Personal history of other diseases of urinary system: Secondary | ICD-10-CM | POA: Diagnosis not present

## 2019-10-08 DIAGNOSIS — R8279 Other abnormal findings on microbiological examination of urine: Secondary | ICD-10-CM | POA: Diagnosis not present

## 2019-10-08 LAB — CBC WITH DIFFERENTIAL/PLATELET
Basophils Absolute: 0 10*3/uL (ref 0.0–0.1)
Basophils Relative: 0.3 % (ref 0.0–3.0)
Eosinophils Absolute: 0 10*3/uL (ref 0.0–0.7)
Eosinophils Relative: 0.2 % (ref 0.0–5.0)
HCT: 43.3 % (ref 36.0–46.0)
Hemoglobin: 14.3 g/dL (ref 12.0–15.0)
Lymphocytes Relative: 7.4 % — ABNORMAL LOW (ref 12.0–46.0)
Lymphs Abs: 1.1 10*3/uL (ref 0.7–4.0)
MCHC: 33 g/dL (ref 30.0–36.0)
MCV: 90.7 fl (ref 78.0–100.0)
Monocytes Absolute: 1.6 10*3/uL — ABNORMAL HIGH (ref 0.1–1.0)
Monocytes Relative: 10.5 % (ref 3.0–12.0)
Neutro Abs: 12.2 10*3/uL — ABNORMAL HIGH (ref 1.4–7.7)
Neutrophils Relative %: 81.6 % — ABNORMAL HIGH (ref 43.0–77.0)
Platelets: 188 10*3/uL (ref 150.0–400.0)
RBC: 4.78 Mil/uL (ref 3.87–5.11)
RDW: 13.1 % (ref 11.5–15.5)
WBC: 14.9 10*3/uL — ABNORMAL HIGH (ref 4.0–10.5)

## 2019-10-08 LAB — POCT URINALYSIS DIPSTICK
Glucose, UA: NEGATIVE
Nitrite, UA: NEGATIVE
Protein, UA: POSITIVE — AB
Spec Grav, UA: >=1.03 — AB (ref 1.010–1.025)
Urobilinogen, UA: 0.2 E.U./dL
pH, UA: 6 (ref 5.0–8.0)

## 2019-10-08 LAB — TSH: TSH: 4.6 u[IU]/mL — ABNORMAL HIGH (ref 0.35–4.50)

## 2019-10-08 LAB — SEDIMENTATION RATE: Sed Rate: 45 mm/hr — ABNORMAL HIGH (ref 0–30)

## 2019-10-08 MED ORDER — CIPROFLOXACIN HCL 500 MG PO TABS: 500.0000 mg | ORAL_TABLET | Freq: Two times a day (BID) | ORAL | 0 refills | Status: AC

## 2019-10-08 MED ORDER — SERTRALINE HCL 100 MG PO TABS: 50.0000 mg | ORAL_TABLET | Freq: Every day | ORAL | Status: DC

## 2019-10-08 MED ORDER — CHOLECALCIFEROL 100 MCG (4000 UT) PO CAPS: 2000.0000 [IU] | ORAL_CAPSULE | Freq: Every day | ORAL | Status: DC

## 2019-10-08 NOTE — Progress Notes (Signed)
Subjective  CC:  Chief Complaint  Patient presents with  . Abdominal Pain  . Diarrhea    x 2 weeks. off and on  . Headache    x 2 days     HPI: Courtney Hood is a 73 y.o. female who presents to the office today to address the problems listed above in the chief complaint.  73 yo female with remote history of pyelonephritis presents with watery nonmucoid nonbloody diarrhea x several weeks. She has a headache and has malaise: "this is how I felt when I had a kidney infection". She denies any urinary sxs. No flank pain. No fevers. She resolved the diarrhea with 2 immodium. Then didn't move her bowels x 3 days so took a laxative. Now with watery diarrhea again. Admits to abdominal bloating and mild cramping with decrease appetite. Denies URI sxs or localized abdominal pain; no h/o diverticulitis. No new foods, sick contacts, recent antibiotics, or recent travel.    Assessment  1. Diarrhea of presumed infectious origin   2. Abdominal bloating   3. History of pyelonephritis      Plan   diarrhea:  Suspect symptoms related to GI source. Check labs and gi studies. Treat supportively in the meantime. May warrant antibiotics or further eval. Benign abdomen now. If worsens, to UC over the weekend otherwise I will follow up with her on Monday. Doubt GU etiology but will check urine studies as well.  However, UA is slightly positive. Will call and start abx. Await culture and other labs.   Office Visit on 10/08/2019  Component Date Value Ref Range Status  . Color, UA 10/08/2019 yellow   Final  . Clarity, UA 10/08/2019 hazy   Final  . Glucose, UA 10/08/2019 Negative  Negative Final  . Bilirubin, UA 10/08/2019 1+   Final  . Ketones, UA 10/08/2019 2+   Final  . Spec Grav, UA 10/08/2019 >=1.030* 1.010 - 1.025 Final  . Blood, UA 10/08/2019 1+   Final  . pH, UA 10/08/2019 6.0  5.0 - 8.0 Final  . Protein, UA 10/08/2019 Positive* Negative Final  . Urobilinogen, UA 10/08/2019 0.2  0.2 or 1.0  E.U./dL Final  . Nitrite, UA 10/08/2019 N   Final  . Leukocytes, UA 10/08/2019 Small (1+)* Negative Final     Follow up: 3-5 days if not improved  Visit date not found  Orders Placed This Encounter  Procedures  . Stool Culture  . Urine Culture  . GI Pathogen panel PCR  . TSH  . CBC w/Diff  . ESR  . UA POCT   Meds ordered this encounter  Medications  . sertraline (ZOLOFT) 100 MG tablet    Sig: Take 0.5 tablets (50 mg total) by mouth daily.  . Cholecalciferol 100 MCG (4000 UT) CAPS    Sig: Take 1 capsule (4,000 Units total) by mouth daily. Take one tablet daily.  . ciprofloxacin (CIPRO) 500 MG tablet    Sig: Take 1 tablet (500 mg total) by mouth 2 (two) times daily for 7 days.    Dispense:  14 tablet    Refill:  0      I reviewed the patients updated PMH, FH, and SocHx.    Patient Active Problem List   Diagnosis Date Noted  . Moderate episode of recurrent major depressive disorder ([place]) 03/16/2018  . Supraspinatus tendon tear, left, initial encounter 03/07/2017  . Hair loss 01/30/2017  . B12 deficiency 08/15/2015  . Right lumbar radiculopathy 04/14/2015  . Primary hyperparathyroidism (  [place]) 08/27/2010  . Hyperlipidemia 07/25/2010  . Major depressive disorder 07/25/2010  . Osteopenia of femoral neck, bilateral 07/25/2010   Current Meds  Medication Sig  . buPROPion (WELLBUTRIN XL) 150 MG 24 hr tablet Take 1 tablet (150 mg total) by mouth daily.  . Cholecalciferol 100 MCG (4000 UT) CAPS Take 1 capsule (4,000 Units total) by mouth daily. Take one tablet daily.  . pravastatin (PRAVACHOL) 40 MG tablet Take 1 tablet (40 mg total) by mouth daily.  . sertraline (ZOLOFT) 100 MG tablet Take 0.5 tablets (50 mg total) by mouth daily.  . [DISCONTINUED] Cholecalciferol 4000 units CAPS Take one tablet daily. (Patient taking differently: Take 2,000 Units by mouth daily. Take one tablet daily.)  . [DISCONTINUED] sertraline (ZOLOFT) 100 MG tablet Take 1 tablet (100 mg total) by mouth  daily. (Patient taking differently: Take 50 mg by mouth daily. )    Allergies: Patient is allergic to actonel [risedronate sodium]. Family History: Patient family history includes Colon cancer (age of onset: 65) in her brother; Leukemia in her father. Social History:  Patient  reports that she quit smoking about 19 years ago. She has never used smokeless tobacco. She reports current alcohol use. She reports that she does not use drugs.  Review of Systems: Constitutional: Negative for fever malaise or anorexia Cardiovascular: negative for chest pain Respiratory: negative for SOB or persistent cough Gastrointestinal: negative for abdominal pain  Objective  Vitals: BP (!) 100/58   Pulse (!) 110   Temp 97.7 F (36.5 C) (Temporal)   Ht '5\' 7"'$  (1.702 m)   Wt 125 lb (56.7 kg)   SpO2 100%   BMI 19.58 kg/m  General: no acute distress , A&Ox3, nontoxic HEENT: PEERL, conjunctiva normal, Oropharynx moist,neck is supple Cardiovascular:  RRR without murmur or gallop.  Respiratory:  Good breath sounds bilaterally, CTAB with normal respiratory effort Gastrointestinal: soft, flat abdomen, normal active bowel sounds, no palpable masses, no hepatosplenomegaly, no appreciated hernias, mild distention and mild diffuse soreness w/o localized ttp or rebound or guarding. Skin:  Warm, no rashes     Commons side effects, risks, benefits, and alternatives for medications and treatment plan prescribed today were discussed, and the patient expressed understanding of the given instructions. Patient is instructed to call or message via MyChart if he/she has any questions or concerns regarding our treatment plan. No barriers to understanding were identified. We discussed Red Flag symptoms and signs in detail. Patient expressed understanding regarding what to do in case of urgent or emergency type symptoms.   Medication list was reconciled, printed and provided to the patient in AVS. Patient instructions and  summary information was reviewed with the patient as documented in the AVS. This note was prepared with assistance of Dragon voice recognition software. Occasional wrong-word or sound-a-like substitutions may have occurred due to the inherent limitations of voice recognition software

## 2019-10-08 NOTE — Progress Notes (Signed)
Please call patient: I have reviewed his/her urine. Urine looks like it possibly could be infected but need more information. I am sending it out to be looked at under the microscope. I have ordered cipro. I will let her know if she needs to start taking it after I receieve the UA results tonight or tomorrow.  Thanks!

## 2019-10-08 NOTE — Patient Instructions (Signed)
Please return in 3-5 days if not improving.   I will release your lab results to you on your MyChart account with further instructions. Please reply with any questions.   Keep hydrated. Next week I will get with you on the next step   If you have any questions or concerns, please don't hesitate to send me a message via MyChart or call the office at [phone]. Thank you for visiting with Courtney Hood today! It's our pleasure caring for you.   Diarrhea, Adult Diarrhea is frequent loose and watery bowel movements. Diarrhea can make you feel weak and cause you to become dehydrated. Dehydration can make you tired and thirsty, cause you to have a dry mouth, and decrease how often you urinate. Diarrhea typically lasts 2-3 days. However, it can last longer if it is a sign of something more serious. It is important to treat your diarrhea as told by your health care provider. Follow these instructions at home: Eating and drinking     Follow these recommendations as told by your health care provider:  Take an oral rehydration solution (ORS). This is an over-the-counter medicine that helps return your body to its normal balance of nutrients and water. It is found at pharmacies and retail stores.  Drink plenty of fluids, such as water, ice chips, diluted fruit juice, and low-calorie sports drinks. You can drink milk also, if desired.  Avoid drinking fluids that contain a lot of sugar or caffeine, such as energy drinks, sports drinks, and soda.  Eat bland, easy-to-digest foods in small amounts as you are able. These foods include bananas, applesauce, rice, lean meats, toast, and crackers.  Avoid alcohol.  Avoid spicy or fatty foods.  Medicines  Take over-the-counter and prescription medicines only as told by your health care provider.  If you were prescribed an antibiotic medicine, take it as told by your health care provider. Do not stop using the antibiotic even if you start to feel better. General  instructions   Wash your hands often using soap and water. If soap and water are not available, use a hand sanitizer. Others in the household should wash their hands as well. Hands should be washed: ? After using the toilet or changing a diaper. ? Before preparing, cooking, or serving food. ? While caring for a sick person or while visiting someone in a hospital.  Drink enough fluid to keep your urine pale yellow.  Rest at home while you recover.  Watch your condition for any changes.  Take a warm bath to relieve any burning or pain from frequent diarrhea episodes.  Keep all follow-up visits as told by your health care provider. This is important. Contact a health care provider if:  You have a fever.  Your diarrhea gets worse.  You have new symptoms.  You cannot keep fluids down.  You feel light-headed or dizzy.  You have a headache.  You have muscle cramps. Get help right away if:  You have chest pain.  You feel extremely weak or you faint.  You have bloody or black stools or stools that look like tar.  You have severe pain, cramping, or bloating in your abdomen.  You have trouble breathing or you are breathing very quickly.  Your heart is beating very quickly.  Your skin feels cold and clammy.  You feel confused.  You have signs of dehydration, such as: ? Dark urine, very little urine, or no urine. ? Cracked lips. ? Dry mouth. ? Sunken eyes. ?  Sleepiness. ? Weakness. Summary  Diarrhea is frequent loose and watery bowel movements. Diarrhea can make you feel weak and cause you to become dehydrated.  Drink enough fluids to keep your urine pale yellow.  Make sure that you wash your hands after using the toilet. If soap and water are not available, use hand sanitizer.  Contact a health care provider if your diarrhea gets worse or you have new symptoms.  Get help right away if you have signs of dehydration. This information is not intended to replace  advice given to you by your health care provider. Make sure you discuss any questions you have with your health care provider. Document Released: 11/08/2002 Document Revised: 04/24/2018 Document Reviewed: 04/24/2018 Elsevier Patient Education  2020 [employer].

## 2019-10-10 LAB — URINE CULTURE
MICRO NUMBER:: 1073411
SPECIMEN QUALITY:: ADEQUATE

## 2019-10-11 ENCOUNTER — Telehealth: Payer: Self-pay

## 2019-10-11 NOTE — Telephone Encounter (Signed)
Patient notified of results and will make an appointment in one week to recheck.

## 2019-10-11 NOTE — Telephone Encounter (Signed)
Copied from [place] [id number]. Topic: General - Other >> Oct 11, 2019 11:56 AM Keene Breath wrote: Reason for CRM: Patient is returning a call regarding some test results.  She is not sure which results it was about.  Please call to discuss at [phone]

## 2019-10-12 ENCOUNTER — Encounter: Payer: Self-pay | Admitting: Family Medicine

## 2019-10-18 ENCOUNTER — Other Ambulatory Visit: Payer: Self-pay | Admitting: Family Medicine

## 2019-10-18 ENCOUNTER — Other Ambulatory Visit: Payer: Self-pay

## 2019-10-18 ENCOUNTER — Ambulatory Visit (INDEPENDENT_AMBULATORY_CARE_PROVIDER_SITE_OTHER): Payer: Medicare HMO | Admitting: Family Medicine

## 2019-10-18 ENCOUNTER — Encounter: Payer: Self-pay | Admitting: Family Medicine

## 2019-10-18 ENCOUNTER — Other Ambulatory Visit: Payer: Medicare HMO

## 2019-10-18 VITALS — BP 112/62 | HR 81 | Temp 97.8°F | Ht 67.0 in | Wt 124.8 lb

## 2019-10-18 DIAGNOSIS — N3000 Acute cystitis without hematuria: Secondary | ICD-10-CM

## 2019-10-18 DIAGNOSIS — F3342 Major depressive disorder, recurrent, in full remission: Secondary | ICD-10-CM

## 2019-10-18 LAB — POCT URINALYSIS DIPSTICK
Bilirubin, UA: NEGATIVE
Blood, UA: NEGATIVE
Glucose, UA: NEGATIVE
Leukocytes, UA: NEGATIVE
Nitrite, UA: NEGATIVE
Protein, UA: NEGATIVE
Spec Grav, UA: >=1.03 — AB (ref 1.010–1.025)
Urobilinogen, UA: 2 E.U./dL — AB
pH, UA: 5 (ref 5.0–8.0)

## 2019-10-18 NOTE — Progress Notes (Signed)
Subjective  CC:  Chief Complaint  Patient presents with  . Urinary Tract Infection  . Cough    Slight cough, started yesterday, has not been out except to come here, no other symptoms    HPI: Courtney Hood is a 73 y.o. female who presents to the office today to address the problems listed above in the chief complaint.  73 yo w/ h/o pyelonephritis and recently treated for E.coli UTI with malaise and atypical sxs including diarrhea: now completed antibiotics and feeling well again. No GI or GU sxs. No longer with malaise or low grade temp. Left urine sample for recheck.  Cough: feels like she may be starting to get a cold. No other sxs. No known covid exposures.   Assessment  1. Acute cystitis without hematuria      Plan   Cystitis vs early pyelo:  Recheck urine. H/o pyelonephritis. Monitor for recurrence. Gets atypical sxs.   Cough: monitor and avoid social interactions until URI sxs  Resolve. Further testing to be determined if worsens.   Follow up: prn  Visit date not found  Orders Placed This Encounter  Procedures  . Urine culture  . POCT urinalysis dipstick   No orders of the defined types were placed in this encounter.     I reviewed the patients updated PMH, FH, and SocHx.    Patient Active Problem List   Diagnosis Date Noted  . Moderate episode of recurrent major depressive disorder ([place]) 03/16/2018  . Supraspinatus tendon tear, left, initial encounter 03/07/2017  . Hair loss 01/30/2017  . B12 deficiency 08/15/2015  . Right lumbar radiculopathy 04/14/2015  . Primary hyperparathyroidism ([place]) 08/27/2010  . Hyperlipidemia 07/25/2010  . Major depressive disorder 07/25/2010  . Osteopenia of femoral neck, bilateral 07/25/2010   Current Meds  Medication Sig  . buPROPion (WELLBUTRIN XL) 150 MG 24 hr tablet Take 1 tablet (150 mg total) by mouth daily.  . Cholecalciferol 100 MCG (4000 UT) CAPS Take 1 capsule (4,000 Units total) by mouth daily. Take one tablet  daily.  . pravastatin (PRAVACHOL) 40 MG tablet Take 1 tablet (40 mg total) by mouth daily.  . sertraline (ZOLOFT) 100 MG tablet Take 0.5 tablets (50 mg total) by mouth daily.    Allergies: Patient is allergic to actonel [risedronate sodium]. Family History: Patient family history includes Colon cancer (age of onset: 18) in her brother; Leukemia in her father. Social History:  Patient  reports that she quit smoking about 19 years ago. She has never used smokeless tobacco. She reports current alcohol use. She reports that she does not use drugs.  Review of Systems: Constitutional: Negative for fever malaise or anorexia Cardiovascular: negative for chest pain Respiratory: negative for SOB or persistent cough Gastrointestinal: negative for abdominal pain  Objective  Vitals: BP 112/62 (BP Location: Left Arm, Patient Position: Sitting, Cuff Size: Normal)   Pulse 81   Temp 97.8 F (36.6 C) (Temporal)   Ht 5\' 7"  (1.702 m)   Wt 124 lb 12.8 oz (56.6 kg)   SpO2 96%   BMI 19.55 kg/m  General: no acute distress , A&Ox3 HEENT: PEERL, conjunctiva normal, Oropharynx moist,neck is supple Cardiovascular:  RRR without murmur or gallop.  Respiratory:  Good breath sounds bilaterally, CTAB with normal respiratory effort Skin:  Warm, no rashes Gastrointestinal: soft, flat abdomen, normal active bowel sounds, no palpable masses, no hepatosplenomegaly, no appreciated hernias, no cvat or suprapubic ttp  No visits with results within 1 Day(s) from this visit.  Latest  known visit with results is:  Office Visit on 10/08/2019  Component Date Value Ref Range Status  . MICRO NUMBER: 10/08/2019 [id number]   Final  . SPECIMEN QUALITY: 10/08/2019 Adequate   Final  . Sample Source 10/08/2019 NOT GIVEN   Final  . STATUS: 10/08/2019 FINAL   Final  . ISOLATE 1: 10/08/2019 Escherichia coli*  Final  . Color, UA 10/08/2019 yellow   Final  . Clarity, UA 10/08/2019 hazy   Final  . Glucose, UA 10/08/2019 Negative   Negative Final  . Bilirubin, UA 10/08/2019 1+   Final  . Ketones, UA 10/08/2019 2+   Final  . Spec Grav, UA 10/08/2019 >=1.030* 1.010 - 1.025 Final  . Blood, UA 10/08/2019 1+   Final  . pH, UA 10/08/2019 6.0  5.0 - 8.0 Final  . Protein, UA 10/08/2019 Positive* Negative Final  . Urobilinogen, UA 10/08/2019 0.2  0.2 or 1.0 E.U./dL Final  . Nitrite, UA 10/08/2019 N   Final  . Leukocytes, UA 10/08/2019 Small (1+)* Negative Final  . Sed Rate 10/08/2019 45* 0 - 30 mm/hr Final  . WBC 10/08/2019 14.9* 4.0 - 10.5 K/uL Final  . RBC 10/08/2019 4.78  3.87 - 5.11 Mil/uL Final  . Hemoglobin 10/08/2019 14.3  12.0 - 15.0 g/dL Final  . HCT 10/08/2019 43.3  36.0 - 46.0 % Final  . MCV 10/08/2019 90.7  78.0 - 100.0 fl Final  . MCHC 10/08/2019 33.0  30.0 - 36.0 g/dL Final  . RDW 10/08/2019 13.1  11.5 - 15.5 % Final  . Platelets 10/08/2019 188.0  150.0 - 400.0 K/uL Final  . Neutrophils Relative % 10/08/2019 81.6* 43.0 - 77.0 % Final  . Lymphocytes Relative 10/08/2019 7.4 Repeated and verified X2.* 12.0 - 46.0 % Final  . Monocytes Relative 10/08/2019 10.5  3.0 - 12.0 % Final  . Eosinophils Relative 10/08/2019 0.2  0.0 - 5.0 % Final  . Basophils Relative 10/08/2019 0.3  0.0 - 3.0 % Final  . Neutro Abs 10/08/2019 12.2* 1.4 - 7.7 K/uL Final  . Lymphs Abs 10/08/2019 1.1  0.7 - 4.0 K/uL Final  . Monocytes Absolute 10/08/2019 1.6* 0.1 - 1.0 K/uL Final  . Eosinophils Absolute 10/08/2019 0.0  0.0 - 0.7 K/uL Final  . Basophils Absolute 10/08/2019 0.0  0.0 - 0.1 K/uL Final  . TSH 10/08/2019 4.60* 0.35 - 4.50 uIU/mL Final       Commons side effects, risks, benefits, and alternatives for medications and treatment plan prescribed today were discussed, and the patient expressed understanding of the given instructions. Patient is instructed to call or message via MyChart if he/she has any questions or concerns regarding our treatment plan. No barriers to understanding were identified. We discussed Red Flag symptoms and  signs in detail. Patient expressed understanding regarding what to do in case of urgent or emergency type symptoms.   Medication list was reconciled, printed and provided to the patient in AVS. Patient instructions and summary information was reviewed with the patient as documented in the AVS. This note was prepared with assistance of Dragon voice recognition software. Occasional wrong-word or sound-a-like substitutions may have occurred due to the inherent limitations of voice recognition software

## 2019-10-19 LAB — URINE CULTURE
MICRO NUMBER:: 1104622
Result:: NO GROWTH
SPECIMEN QUALITY:: ADEQUATE

## 2019-10-20 ENCOUNTER — Encounter: Payer: Self-pay | Admitting: Family Medicine

## 2019-10-20 ENCOUNTER — Ambulatory Visit (INDEPENDENT_AMBULATORY_CARE_PROVIDER_SITE_OTHER): Payer: Medicare HMO | Admitting: Family Medicine

## 2019-10-20 ENCOUNTER — Other Ambulatory Visit: Payer: Self-pay

## 2019-10-20 ENCOUNTER — Ambulatory Visit: Payer: Self-pay

## 2019-10-20 VITALS — BP 122/60 | HR 80 | Ht 67.0 in | Wt 126.6 lb

## 2019-10-20 DIAGNOSIS — M5412 Radiculopathy, cervical region: Secondary | ICD-10-CM | POA: Diagnosis not present

## 2019-10-20 DIAGNOSIS — M62838 Other muscle spasm: Secondary | ICD-10-CM | POA: Diagnosis not present

## 2019-10-20 DIAGNOSIS — M542 Cervicalgia: Secondary | ICD-10-CM

## 2019-10-20 MED ORDER — HYDROCODONE-ACETAMINOPHEN 5-325 MG PO TABS: 1.0000 | ORAL_TABLET | Freq: Four times a day (QID) | ORAL | 0 refills | Status: DC | PRN

## 2019-10-20 MED ORDER — GABAPENTIN 300 MG PO CAPS: ORAL_CAPSULE | ORAL | 3 refills | Status: DC

## 2019-10-20 MED ORDER — PREDNISONE 50 MG PO TABS: 50.0000 mg | ORAL_TABLET | Freq: Every day | ORAL | 0 refills | Status: DC

## 2019-10-20 NOTE — Telephone Encounter (Signed)
Noted  

## 2019-10-20 NOTE — Patient Instructions (Signed)
Thank you for coming in today. Try using heating pad and TENS unit.  Attend PT.  Take prednisone for 5 days.  Try gabapentin for nerve pain.  Use hydrocodone sparingly for severe pain.  Recheck if not improving or if worsening.   Come back or go to the emergency room if you notice new weakness new numbness problems walking or bowel or bladder problems.   TENS UNIT: This is helpful for muscle pain and spasm.   Search and Purchase a TENS 7000 2nd edition at  www.tenspros.com or [email or web address] It should be less than $30.     TENS unit instructions: Do not shower or bathe with the unit on Turn the unit off before removing electrodes or batteries If the electrodes lose stickiness add a drop of water to the electrodes after they are disconnected from the unit and place on plastic sheet. If you continued to have difficulty, call the TENS unit company to purchase more electrodes. Do not apply lotion on the skin area prior to use. Make sure the skin is clean and dry as this will help prolong the life of the electrodes. After use, always check skin for unusual red areas, rash or other skin difficulties. If there are any skin problems, does not apply electrodes to the same area. Never remove the electrodes from the unit by pulling the wires. Do not use the TENS unit or electrodes other than as directed. Do not change electrode placement without consultating your therapist or physician. Keep 2 fingers with between each electrode. Wear time ratio is 2:1, on to off times.    For example on for 30 minutes off for 15 minutes and then on for 30 minutes off for 15 minutes      Cervical Radiculopathy  Cervical radiculopathy happens when a nerve in the neck (a cervical nerve) is pinched or bruised. This condition can happen because of an injury to the cervical spine (vertebrae) in the neck, or as part of the normal aging process. Pressure on the cervical nerves can cause pain or numbness that  travels from the neck all the way down into the arm and fingers. Usually, this condition gets better with rest. Treatment may be needed if the condition does not improve. What are the causes? This condition may be caused by:  A neck injury.  A bulging (herniated) disk.  Muscle spasms.  Muscle tightness in the neck because of overuse.  Arthritis.  Breakdown or degeneration in the bones and joints of the spine (spondylosis) due to aging.  Bone spurs that may develop near the cervical nerves. What are the signs or symptoms? Symptoms of this condition include:  Pain. The pain may travel from the neck to the arm and hand. The pain can be severe or irritating. It may be worse when you move your neck.  Numbness or tingling in your arm or hand.  Weakness in the affected arm and hand, in severe cases. How is this diagnosed? This condition may be diagnosed based on your symptoms, your medical history, and a physical exam. You may also have tests, including:  X-rays.  A CT scan.  An MRI.  An electromyogram (EMG).  Nerve conduction tests. How is this treated? In many cases, treatment is not needed for this condition. With rest, the condition usually gets better over time. If treatment is needed, options may include:  Wearing a soft neck collar (cervical collar) for short periods of time, as told by your health care provider.  Doing  physical therapy to strengthen your neck muscles.  Taking medicines, such as NSAIDs or oral corticosteroids.  Having spinal injections, in severe cases.  Having surgery. This may be needed if other treatments do not help. Different types of surgery may be done depending on the cause of this condition. Follow these instructions at home: If you have a cervical collar:  Wear it as told by your health care provider. Remove it only as told by your health care provider.  Ask your health care provider if you can remove the collar for cleaning and  bathing. If you are allowed to remove the collar for cleaning or bathing: ? Follow instructions from your health care provider about how to remove the collar safely. ? Clean the collar by wiping it with mild soap and water and drying it completely. ? Take out any removable pads in the collar every 1-2 days, and wash them by hand with soap and water. Let them air-dry completely before you put them back in the collar. ? Check your skin under the collar for irritation or sores. If you see any, tell your health care provider. Managing pain      Take over-the-counter and prescription medicines only as told by your health care provider.  If directed, put ice on the affected area. ? If you have a soft neck collar, remove it as told by your health care provider. ? Put ice in a plastic bag. ? Place a towel between your skin and the bag. ? Leave the ice on for 20 minutes, 2-3 times a day.  If applying ice does not help, you can try using heat. Use the heat source that your health care provider recommends, such as a moist heat pack or a heating pad. ? Place a towel between your skin and the heat source. ? Leave the heat on for 20-30 minutes. ? Remove the heat if your skin turns bright red. This is especially important if you are unable to feel pain, heat, or cold. You may have a greater risk of getting burned.  Try a gentle neck and shoulder massage to help relieve symptoms. Activity  Rest as needed.  Return to your normal activities as told by your health care provider. Ask your health care provider what activities are safe for you.  Do stretching and strengthening exercises as told by your health care provider or physical therapist.  Do not lift anything that is heavier than 10 lb (4.5 kg) until your health care provider tells you that it is safe. General instructions  Use a flat pillow when you sleep.  Do not drive while wearing a cervical collar. If you do not have a cervical collar,  ask your health care provider if it is safe to drive while your neck heals.  Ask your health care provider if the medicine prescribed to you requires you to avoid driving or using heavy machinery.  Do not use any products that contain nicotine or tobacco, such as cigarettes, e-cigarettes, and chewing tobacco. These can delay healing. If you need help quitting, ask your health care provider.  Keep all follow-up visits as told by your health care provider. This is important. Contact a health care provider if:  Your condition does not improve with treatment. Get help right away if:  Your pain gets much worse and cannot be controlled with medicines.  You have weakness or numbness in your hand, arm, face, or leg.  You have a high fever.  You have a stiff,  rigid neck.  You lose control of your bowels or your bladder (have incontinence).  You have trouble with walking, balance, or speaking. Summary  Cervical radiculopathy happens when a nerve in the neck is pinched or bruised.  A nerve can get pinched from a bulging disk, arthritis, muscle spasms, or an injury to the neck.  Symptoms include pain, tingling, or numbness radiating from the neck into the arm or hand. Weakness can also occur in severe cases.  Treatment may include rest, wearing a cervical collar, and physical therapy. Medicines may be prescribed to help with pain. In severe cases, injections or surgery may be needed. This information is not intended to replace advice given to you by your health care provider. Make sure you discuss any questions you have with your health care provider. Document Released: 08/13/2001 Document Revised: 10/09/2018 Document Reviewed: 10/09/2018 Elsevier Patient Education  2020 [employer].

## 2019-10-20 NOTE — Telephone Encounter (Signed)
See note

## 2019-10-20 NOTE — Telephone Encounter (Signed)
Pt. Reports she has had right neck and shoulder pain x 3-4 days. Starts in her neck and goes into her shoulder and shoulder blade. Could not sleep last night because of the pain. No numbness or weakness. Warm transfer to [facility] in the practice for a visit.  Answer Assessment - Initial Assessment Questions 1. ONSET: "When did the pain start?"     3-4 days ago 2. LOCATION: "Where is the pain located?"     Right side of neck and shoulder 3. PAIN: "How bad is the pain?" (Scale 1-10; or mild, moderate, severe)   - MILD (1-3): doesn't interfere with normal activities   - MODERATE (4-7): interferes with normal activities (e.g., work or school) or awakens from sleep   - SEVERE (8-10): excruciating pain, unable to do any normal activities, unable to move arm at all due to pain     9-10 4. WORK OR EXERCISE: "Has there been any recent work or exercise that involved this part of the body?"     No 5. CAUSE: "What do you think is causing the shoulder pain?"     Was bed sick last week 6. OTHER SYMPTOMS: "Do you have any other symptoms?" (e.g., neck pain, swelling, rash, fever, numbness, weakness)     No 7. PREGNANCY: "Is there any chance you are pregnant?" "When was your last menstrual period?"     No  Protocols used: SHOULDER PAIN-A-AH

## 2019-10-20 NOTE — Progress Notes (Signed)
Subjective:    CC: Neck and R shoulder pain  I, Molly Weber, LAT, ATC, am serving as scribe for Dr. Lynne Leader.  HPI: Pt is a 73 y/o female presenting w/ c/o neck and R shoulder pain x 3-4 days.  She does not recall any specific MOI but reports having been "sick in bed" last week so was laying down a lot.  Pt rates her pain as a 9/10 and states that it is interfering w/ her sleep.  She describes the pain as a hot poker sensation that runs from her R upper trap down along the medial border of her scapula and then into her R shoulder.  Pt reports some intermittent tingling into her R fingers.  She denies any neck pain or loss of motion and denies any loss of R shoulder ROM.  Main aggravating factor is stationary positioning or trying to be still.  She has been taking Tylenol and using Blue Emu w/ no relief.  She denies any injury history.  No weakness or numbness distally.  No history of cervical radiculopathy.  Past medical history, Surgical history, Family history not pertinant except as noted below, Social history, Allergies, and medications have been entered into the medical record, reviewed, and no changes needed.   Review of Systems: No headache, visual changes, nausea, vomiting, diarrhea, constipation, dizziness, abdominal pain, skin rash, fevers, chills, night sweats, weight loss, swollen lymph nodes, body aches, joint swelling, muscle aches, chest pain, shortness of breath, mood changes, visual or auditory hallucinations.   Objective:    Vitals:   10/20/19 1300  BP: 122/60  Pulse: 80  SpO2: 96%   General: Well Developed, well nourished, and in no acute distress.  Neuro/Psych: Alert and oriented x3, extra-ocular muscles intact, able to move all 4 extremities, sensation grossly intact. Skin: Warm and dry, no rashes noted.  Respiratory: Not using accessory muscles, speaking in full sentences, trachea midline.  Cardiovascular: Pulses palpable, no extremity edema. Abdomen:  Does not appear distended. MSK: C-spine: Nontender to spinal midline. Normal cervical motion however some pain and slight limited motion with right lateral flexion. Upper extremity strength reflexes and sensation are equal and normal throughout bilateral upper extremities.  Right shoulder normal-appearing mildly tender palpation at right trapezius. Normal shoulder motion. Negative impingement testing. Shoulder strength is intact.  Left shoulder normal-appearing Normal motion. Nontender. Negative impingement testing. Shoulder strength is intact.  Pulses cap refill are intact bilateral upper extremities.   Impression and Recommendations:    Assessment and Plan: 73 y.o. female with  Right trapezius and periscapular shoulder/neck pain.  At this time the etiology is somewhat uncertain however I think it is very likely to be either cervical radiculopathy or trapezius or periscapular muscle spasm or both.. After discussion plan for treatment with course of prednisone, gabapentin, limited hydrocodone.  Also treat with TENS unit and heating pad.  Refer to physical therapy as the foundation of treatment.  Recheck back if not improving or if worsening.  Precautions reviewed.  PDMP reviewed during this encounter. Orders Placed This Encounter  Procedures  . Ambulatory referral to Physical Therapy    Referral Priority:   Routine    Referral Type:   Physical Medicine    Referral Reason:   Specialty Services Required    Requested Specialty:   Physical Therapy   Meds ordered this encounter  Medications  . predniSONE (DELTASONE) 50 MG tablet    Sig: Take 1 tablet (50 mg total) by mouth daily.  Dispense:  5 tablet    Refill:  0  . gabapentin (NEURONTIN) 300 MG capsule    Sig: One tab PO qHS for a week, then BID for a week, then TID. May double weekly to a max of 3,600mg /day    Dispense:  180 capsule    Refill:  3  . HYDROcodone-acetaminophen (NORCO/VICODIN) 5-325 MG tablet    Sig: Take 1  tablet by mouth every 6 (six) hours as needed.    Dispense:  10 tablet    Refill:  0    Discussed warning signs or symptoms. Please see discharge instructions. Patient expresses understanding.   The above documentation has been reviewed and is accurate and complete Lynne Leader

## 2019-11-01 ENCOUNTER — Other Ambulatory Visit: Payer: Self-pay

## 2019-11-01 ENCOUNTER — Ambulatory Visit: Payer: Medicare HMO | Admitting: Physical Therapy

## 2019-11-01 ENCOUNTER — Encounter: Payer: Self-pay | Admitting: Physical Therapy

## 2019-11-01 DIAGNOSIS — M62838 Other muscle spasm: Secondary | ICD-10-CM

## 2019-11-01 DIAGNOSIS — M542 Cervicalgia: Secondary | ICD-10-CM

## 2019-11-01 NOTE — Patient Instructions (Signed)
Access Code: [email or web address]  URL: [email or web address]  Date: 11/01/2019  Prepared by: Lyndee Hensen   Exercises Seated Cervical Sidebending Stretch - 3 reps - 30 hold - 2x daily Seated Levator Scapulae Stretch - 3 reps - 30 hold - 2x daily Standing Shoulder Posterior Capsule Stretch - 3 reps - 30 hold - 2x daily

## 2019-11-04 ENCOUNTER — Other Ambulatory Visit: Payer: Self-pay

## 2019-11-04 ENCOUNTER — Encounter: Payer: Self-pay | Admitting: Physical Therapy

## 2019-11-04 ENCOUNTER — Ambulatory Visit: Payer: Medicare HMO | Admitting: Physical Therapy

## 2019-11-04 DIAGNOSIS — M62838 Other muscle spasm: Secondary | ICD-10-CM

## 2019-11-04 DIAGNOSIS — M542 Cervicalgia: Secondary | ICD-10-CM

## 2019-11-04 NOTE — Therapy (Signed)
[facility] [address] [place], [place], [address] Phone: [phone]   Fax:  [phone]  Physical Therapy Evaluation  Patient Details  Name: Courtney Hood MRN: [id number] Date of Birth: May 14, 1946 Referring Provider (Courtney Hood): Lynne Leader   Encounter Date: 11/01/2019  Courtney Hood End of Session - 11/04/19 1139    Visit Number  1    Number of Visits  12    Date for Courtney Hood Re-Evaluation  12/13/19    Authorization Type  [employer]    Courtney Hood Start Time  [id number]    Courtney Hood Stop Time  1435    Courtney Hood Time Calculation (min)  49 min    Activity Tolerance  Patient tolerated treatment well    Behavior During Therapy  [facility] for tasks assessed/performed       Past Medical History:  Diagnosis Date  . Osteopenia of femoral neck, bilateral   . Primary hyperparathyroidism ([place])     Past Surgical History:  Procedure Laterality Date  . ABDOMINAL HYSTERECTOMY    . APPENDECTOMY      There were no vitals filed for this visit.   Subjective Assessment - 11/04/19 1138    Subjective  Courtney Hood states pain about 2 weeks ago, thinks she slept wrong. Saw Sports MEd, is doing somewhat better since start of medications. Courtney Hood does not work, but is active at home on computer for church. States increased pain in R posterior shoulder/shoulder blade region, and pain into upper arm.Also has tingling into tips of fingers at times. Has had no prevous neck pain.    Currently in Pain?  Yes    Pain Score  5     Pain Location  Shoulder    Pain Orientation  Right;Posterior    Pain Descriptors / Indicators  Shooting;Sharp;Aching    Pain Type  Acute pain    Pain Onset  1 to 4 weeks ago    Pain Frequency  Intermittent    Aggravating Factors   none stated    Pain Relieving Factors  Medication, tens         [facility] Courtney Hood Assessment - 11/04/19 0001      Assessment   Medical Diagnosis  cervical radiculitis    Referring Provider (Courtney Hood)  Lynne Leader    Hand Dominance  Right    Prior Therapy  no       Balance Screen   Has the patient fallen in the past 6 months  No      Prior Function   Level of Independence  Independent      Cognition   Overall Cognitive Status  Within Functional Limits for tasks assessed      ROM / Strength   AROM / PROM / Strength  AROM;Strength      AROM   Overall AROM Comments  PROM R shoulder : WNL,  AROM: mild limitation and end range for elevation, due to pain in shoulder blade.  Cervical: [facility]      Strength   Overall Strength Comments  Shoulder: 4/5, Scapular: 4-/5,       Palpation   Palpation comment  Painful trigger points at R rhomboid, and R Infraspinatus and Teres. Mild tightness in R UT.       Special Tests   Other special tests  + ULTT, Unable to elicit or decrease symptoms with neck compression, distraction                 Objective measurements completed on examination: See above findings.  [facility] Adult Courtney Hood Treatment/Exercise - 11/04/19 0001      Exercises   Exercises  Neck      Neck Exercises: Stretches   Upper Trapezius Stretch  2 reps;30 seconds    Levator Stretch  2 reps;30 seconds    Other Neck Stretches  Posterior shoulder stretch 30 sec x3 R;        Trigger Point Dry Needling - 11/04/19 0001    Consent Given?  Yes    Education Handout Provided  Yes    Muscles Treated Upper Quadrant  Rhomboids;Infraspinatus;Teres major;Teres minor    Rhomboids Response  Palpable increased muscle length    Infraspinatus Response  Twitch response elicited;Palpable increased muscle length    Teres major Response  Twitch response elicited;Palpable increased muscle length    Teres minor Response  Twitch response elicited;Palpable increased muscle length           Courtney Hood Education - 11/04/19 1139    Education Details  lnitial HEP, Courtney Hood POC, exam findings.    Person(s) Educated  Patient    Methods  Explanation;Demonstration;Tactile cues;Verbal cues;Handout    Comprehension  Verbalized understanding;Returned demonstration;Verbal cues  required;Tactile cues required;Need further instruction       Courtney Hood Short Term Goals - 11/04/19 1140      Courtney Hood SHORT TERM GOAL #1   Title  Courtney Hood to be independent with initial HEP    Time  2    Period  Weeks    Status  New    Target Date  11/15/19      Courtney Hood SHORT TERM GOAL #2   Title  Courtney Hood to report decreased pain in R posterior shoulder and cervical region, to 0-3/10    Time  2    Period  Weeks    Status  New    Target Date  11/15/19        Courtney Hood Long Term Goals - 11/04/19 1140      Courtney Hood LONG TERM GOAL #1   Title  Courtney Hood to be independent with final HEP    Time  6    Period  Weeks    Status  New    Target Date  12/12/18      Courtney Hood LONG TERM GOAL #2   Title  Courtney Hood to report decreased pain in shoulder/neck region to 0-2/10 with activity    Time  6    Period  Weeks    Status  New    Target Date  12/12/18      Courtney Hood LONG TERM GOAL #3   Title  Courtney Hood to demo soft tissue restrictions in cervical and shoulder blade region to be WNL, to improve pain.    Time  6    Period  Weeks    Status  New    Target Date  12/12/18      Courtney Hood LONG TERM GOAL #4   Title  Courtney Hood to demo full cervical and shoulder AROM without increased pain to improve ability for IADLS.    Time  6    Period  Weeks    Status  New    Target Date  12/12/18             Plan - 11/04/19 1141    Clinical Impression Statement  Courtney Hood presents with primary complaint of increased pain in R shoulder blade region, into R UE and tingling into tips of fingers. She reports no neck pain. Courtney Hood with active, palpable trigger points in rhomboid, infraspinatus and teres, that  reproduce UE symptoms. DN done today with good response. WIll treat trigger points as source of pain, as well as continue to monitor for cervical radiculopathy. Courtney Hood with mild cervical and shoulder ROM loss, due to pain. She has decreased ability for full functional activities, due to pain. Courtney Hood to benefit from skilled Courtney Hood to improve deficits and pain.    Examination-Activity Limitations  Reach  Overhead;Sleep;Dressing;Lift    Examination-Participation Restrictions  Church;Cleaning;Community Activity;Shop;Laundry    Stability/Clinical Decision Making  Evolving/Moderate complexity    Clinical Decision Making  Moderate    Rehab Potential  Good    Courtney Hood Frequency  2x / week    Courtney Hood Duration  6 weeks    Courtney Hood Treatment/Interventions  ADLs/Self Care Home Management;Cryotherapy;Electrical Stimulation;Ultrasound;Traction;Moist Heat;Iontophoresis 4mg /ml Dexamethasone;Functional mobility training;Therapeutic activities;Therapeutic exercise;Neuromuscular re-education;Manual techniques;Patient/family education;Passive range of motion;Dry needling;Taping;Joint Manipulations;Spinal Manipulations    Courtney Hood Home Exercise Plan  [id number]    Consulted and Agree with Plan of Care  Patient       Patient will benefit from skilled therapeutic intervention in order to improve the following deficits and impairments:  Decreased range of motion, Increased muscle spasms, Decreased activity tolerance, Pain, Decreased strength, Decreased mobility  Visit Diagnosis: Cervicalgia  Other muscle spasm     Problem List Patient Active Problem List   Diagnosis Date Noted  . Moderate episode of recurrent major depressive disorder ([place]) 03/16/2018  . Supraspinatus tendon tear, left, initial encounter 03/07/2017  . Hair loss 01/30/2017  . B12 deficiency 08/15/2015  . Right lumbar radiculopathy 04/14/2015  . Primary hyperparathyroidism ([place]) 08/27/2010  . Hyperlipidemia 07/25/2010  . Major depressive disorder 07/25/2010  . Osteopenia of femoral neck, bilateral 07/25/2010    Courtney Hood, Courtney Hood, Courtney Hood 11:56 AM  11/04/19    [facility] [place], [place], [address] Phone: [phone]   Fax:  [phone]  Name: Courtney Hood MRN: [id number] Date of Birth: 01-Nov-1946

## 2019-11-04 NOTE — Therapy (Signed)
[facility] [address] [place], [place], [address] Phone: [phone]   Fax:  [phone]  Physical Therapy Treatment  Patient Details  Name: Courtney Hood MRN: [id number] Date of Birth: [birth year] Referring Provider (PT): Lynne Leader   Encounter Date: 11/04/2019  PT End of Session - 11/04/19 1206    Visit Number  2    Number of Visits  12    Date for PT Re-Evaluation  12/13/19    Authorization Type  [employer]    PT Start Time  [id number]    PT Stop Time  1055    PT Time Calculation (min)  40 min    Activity Tolerance  Patient tolerated treatment well    Behavior During Therapy  [facility] for tasks assessed/performed       Past Medical History:  Diagnosis Date  . Osteopenia of femoral neck, bilateral   . Primary hyperparathyroidism ([place])     Past Surgical History:  Procedure Laterality Date  . ABDOMINAL HYSTERECTOMY    . APPENDECTOMY      There were no vitals filed for this visit.  Subjective Assessment - 11/04/19 1202    Subjective  Pt states minimal change from last visit, most sore in posterior shoulder.    Currently in Pain?  Yes    Pain Score  5     Pain Location  Shoulder    Pain Orientation  Right;Posterior    Pain Descriptors / Indicators  Aching;Sharp;Shooting    Pain Type  Acute pain    Pain Onset  1 to 4 weeks ago    Pain Frequency  Intermittent                       [facility] Adult PT Treatment/Exercise - 11/04/19 1209      Exercises   Exercises  Neck      Neck Exercises: Standing   Other Standing Exercises  Rows RTB x20;       Neck Exercises: Seated   Other Seated Exercise  Shoulder pully, flexion and abd  x15ea ;       Manual Therapy   Manual Therapy  Joint mobilization;Soft tissue mobilization;Passive ROM;Manual Traction    Manual therapy comments  skilled palpation and monitoring of soft tissue with dry needling.     Joint Mobilization  PA mobs c-spine     Soft tissue mobilization   IASTM for R rhomboid and shoulder blade.     Manual Traction  cervical 10 sec x8;       Neck Exercises: Stretches   Upper Trapezius Stretch  2 reps;30 seconds    Levator Stretch  --    Corner Stretch  3 reps;30 seconds    Other Neck Stretches  Posterior shoulder stretch 30 sec x3 R;     Other Neck Stretches  Supine pec stretch with UE nerve glides x20;        Trigger Point Dry Needling - 11/04/19 1211    Consent Given?  Yes    Education Handout Provided  Previously provided    Muscles Treated Upper Quadrant  Rhomboids;Infraspinatus;Teres major;Teres minor    Rhomboids Response  Twitch response elicited;Palpable increased muscle length    Infraspinatus Response  Twitch response elicited;Palpable increased muscle length    Teres major Response  Palpable increased muscle length    Teres minor Response  Palpable increased muscle length             PT Short Term Goals -  11/04/19 1140      PT SHORT TERM GOAL #1   Title  Pt to be independent with initial HEP    Time  2    Period  Weeks    Status  New    Target Date  11/15/19      PT SHORT TERM GOAL #2   Title  Pt to report decreased pain in R posterior shoulder and cervical region, to 0-3/10    Time  2    Period  Weeks    Status  New    Target Date  11/15/19        PT Long Term Goals - 11/04/19 1140      PT LONG TERM GOAL #1   Title  Pt to be independent with final HEP    Time  6    Period  Weeks    Status  New    Target Date  12/12/18      PT LONG TERM GOAL #2   Title  Pt to report decreased pain in shoulder/neck region to 0-2/10 with activity    Time  6    Period  Weeks    Status  New    Target Date  12/12/18      PT LONG TERM GOAL #3   Title  Pt to demo soft tissue restrictions in cervical and shoulder blade region to be WNL, to improve pain.    Time  6    Period  Weeks    Status  New    Target Date  12/12/18      PT LONG TERM GOAL #4   Title  Pt to demo full cervical and shoulder AROM without  increased pain to improve ability for IADLS.    Time  6    Period  Weeks    Status  New    Target Date  12/12/18            Plan - 11/04/19 1208    Clinical Impression Statement  DN done again today for most painful trigger points in posterior shoulder/shoulder blade region. Manual therapy and traction also done for c-spine. HEP updated with ther ex and stretching for pain. Plan to progress as tolerated.    Examination-Activity Limitations  Reach Overhead;Sleep;Dressing;Lift    Examination-Participation Restrictions  Church;Cleaning;Community Activity;Shop;Laundry    Stability/Clinical Decision Making  Evolving/Moderate complexity    Rehab Potential  Good    PT Frequency  2x / week    PT Duration  6 weeks    PT Treatment/Interventions  ADLs/Self Care Home Management;Cryotherapy;Electrical Stimulation;Ultrasound;Traction;Moist Heat;Iontophoresis 4mg /ml Dexamethasone;Functional mobility training;Therapeutic activities;Therapeutic exercise;Neuromuscular re-education;Manual techniques;Patient/family education;Passive range of motion;Dry needling;Taping;Joint Manipulations;Spinal Manipulations    PT Home Exercise Plan  [id number]    Consulted and Agree with Plan of Care  Patient       Patient will benefit from skilled therapeutic intervention in order to improve the following deficits and impairments:  Decreased range of motion, Increased muscle spasms, Decreased activity tolerance, Pain, Decreased strength, Decreased mobility  Visit Diagnosis: Cervicalgia  Other muscle spasm     Problem List Patient Active Problem List   Diagnosis Date Noted  . Moderate episode of recurrent major depressive disorder ([place]) 03/16/2018  . Supraspinatus tendon tear, left, initial encounter 03/07/2017  . Hair loss 01/30/2017  . B12 deficiency 08/15/2015  . Right lumbar radiculopathy 04/14/2015  . Primary hyperparathyroidism ([place]) 08/27/2010  . Hyperlipidemia 07/25/2010  . Major depressive disorder  07/25/2010  . Osteopenia of femoral neck,  bilateral 07/25/2010    Lyndee Hensen, PT, DPT 12:12 PM  11/04/19    [facility] [place], [place], [address] Phone: [phone]   Fax:  [phone]  Name: [place] Couser MRN: [id number] Date of Birth: 23-Apr-1946

## 2019-11-08 ENCOUNTER — Encounter: Payer: Self-pay | Admitting: Physical Therapy

## 2019-11-08 ENCOUNTER — Ambulatory Visit (INDEPENDENT_AMBULATORY_CARE_PROVIDER_SITE_OTHER): Payer: Medicare HMO | Admitting: Physical Therapy

## 2019-11-08 ENCOUNTER — Other Ambulatory Visit: Payer: Self-pay

## 2019-11-08 DIAGNOSIS — M542 Cervicalgia: Secondary | ICD-10-CM | POA: Diagnosis not present

## 2019-11-08 DIAGNOSIS — M62838 Other muscle spasm: Secondary | ICD-10-CM | POA: Diagnosis not present

## 2019-11-08 DIAGNOSIS — M5412 Radiculopathy, cervical region: Secondary | ICD-10-CM | POA: Diagnosis not present

## 2019-11-09 NOTE — Therapy (Signed)
[facility] [address] [place], [place], [address] Phone: [phone]   Fax:  [phone]  Physical Therapy Treatment  Patient Details  Name: Courtney Hood MRN: [id number] Date of Birth: [birth year] Referring Provider (PT): Lynne Leader   Encounter Date: 11/08/2019  PT End of Session - 11/08/19 1404    Visit Number  3    Number of Visits  12    Date for PT Re-Evaluation  12/13/19    Authorization Type  [employer]    PT Start Time  [id number]    PT Stop Time  1435    PT Time Calculation (min)  40 min    Activity Tolerance  Patient tolerated treatment well    Behavior During Therapy  [facility] for tasks assessed/performed       Past Medical History:  Diagnosis Date  . Osteopenia of femoral neck, bilateral   . Primary hyperparathyroidism ([place])     Past Surgical History:  Procedure Laterality Date  . ABDOMINAL HYSTERECTOMY    . APPENDECTOMY      There were no vitals filed for this visit.  Subjective Assessment - 11/08/19 1404    Subjective  Pt states mild improvments in pain. Less tingling into fingers.Also states pain less constant.    Currently in Pain?  Yes    Pain Score  3     Pain Location  Shoulder    Pain Orientation  Right;Posterior    Pain Type  Acute pain    Pain Onset  1 to 4 weeks ago    Pain Frequency  Intermittent                       [facility] Adult PT Treatment/Exercise - 11/08/19 1358      Exercises   Exercises  Neck      Neck Exercises: Theraband   Rows  Red;20 reps    Shoulder External Rotation  20 reps    Shoulder External Rotation Limitations  YTB,  bil    Shoulder Internal Rotation  20 reps    Shoulder Internal Rotation Limitations  YTB      Neck Exercises: Standing   Other Standing Exercises  --      Neck Exercises: Seated   Other Seated Exercise  Shoulder pully, flexion and abd  x15ea ;       Manual Therapy   Manual Therapy  Joint mobilization;Soft tissue mobilization;Passive  ROM;Manual Traction    Manual therapy comments  skilled palpation and monitoring of soft tissue with dry needling.     Joint Mobilization  PA mobs c-spine     Soft tissue mobilization  DTM/IASTM for R rhomboid and shoulder blade.     Manual Traction  cervical 10 sec x8;       Neck Exercises: Stretches   Upper Trapezius Stretch  2 reps;30 seconds    Corner Stretch  3 reps;30 seconds    Other Neck Stretches  Posterior shoulder stretch 30 sec x3 R;     Other Neck Stretches  --       Trigger Point Dry Needling - 11/09/19 0001    Consent Given?  Yes    Education Handout Provided  Previously provided    Muscles Treated Upper Quadrant  Longissimus    Rhomboids Response  Palpable increased muscle length    Infraspinatus Response  Twitch response elicited;Palpable increased muscle length    Longissimus Response  Palpable increased muscle length  PT Short Term Goals - 11/04/19 1140      PT SHORT TERM GOAL #1   Title  Pt to be independent with initial HEP    Time  2    Period  Weeks    Status  New    Target Date  11/15/19      PT SHORT TERM GOAL #2   Title  Pt to report decreased pain in R posterior shoulder and cervical region, to 0-3/10    Time  2    Period  Weeks    Status  New    Target Date  11/15/19        PT Long Term Goals - 11/04/19 1140      PT LONG TERM GOAL #1   Title  Pt to be independent with final HEP    Time  6    Period  Weeks    Status  New    Target Date  12/12/18      PT LONG TERM GOAL #2   Title  Pt to report decreased pain in shoulder/neck region to 0-2/10 with activity    Time  6    Period  Weeks    Status  New    Target Date  12/12/18      PT LONG TERM GOAL #3   Title  Pt to demo soft tissue restrictions in cervical and shoulder blade region to be WNL, to improve pain.    Time  6    Period  Weeks    Status  New    Target Date  12/12/18      PT LONG TERM GOAL #4   Title  Pt to demo full cervical and shoulder AROM without  increased pain to improve ability for IADLS.    Time  6    Period  Weeks    Status  New    Target Date  12/12/18            Plan - 11/09/19 1153    Clinical Impression Statement  Pt with improving pain overall, still has significant tenderness, and most pain at infraspinatus and rhomboid region, with palpable trigger points. Addressed with manual therapy, due to + results in previous visits.    Examination-Activity Limitations  Reach Overhead;Sleep;Dressing;Lift    Examination-Participation Restrictions  Church;Cleaning;Community Activity;Shop;Laundry    Stability/Clinical Decision Making  Evolving/Moderate complexity    Rehab Potential  Good    PT Frequency  2x / week    PT Duration  6 weeks    PT Treatment/Interventions  ADLs/Self Care Home Management;Cryotherapy;Electrical Stimulation;Ultrasound;Traction;Moist Heat;Iontophoresis 4mg /ml Dexamethasone;Functional mobility training;Therapeutic activities;Therapeutic exercise;Neuromuscular re-education;Manual techniques;Patient/family education;Passive range of motion;Dry needling;Taping;Joint Manipulations;Spinal Manipulations    PT Home Exercise Plan  [id number]    Consulted and Agree with Plan of Care  Patient       Patient will benefit from skilled therapeutic intervention in order to improve the following deficits and impairments:  Decreased range of motion, Increased muscle spasms, Decreased activity tolerance, Pain, Decreased strength, Decreased mobility  Visit Diagnosis: Cervicalgia  Other muscle spasm  Cervical radiculitis     Problem List Patient Active Problem List   Diagnosis Date Noted  . Moderate episode of recurrent major depressive disorder ([place]) 03/16/2018  . Supraspinatus tendon tear, left, initial encounter 03/07/2017  . Hair loss 01/30/2017  . B12 deficiency 08/15/2015  . Right lumbar radiculopathy 04/14/2015  . Primary hyperparathyroidism ([place]) 08/27/2010  . Hyperlipidemia 07/25/2010  . Major  depressive disorder 07/25/2010  .  Osteopenia of femoral neck, bilateral 07/25/2010    Lyndee Hensen, PT, DPT 11:55 AM  11/09/19    [facility] [place], [place], [address] Phone: [phone]   Fax:  [phone]  Name: Courtney Hood MRN: [id number] Date of Birth: 18-Nov-1946

## 2019-11-10 ENCOUNTER — Ambulatory Visit (INDEPENDENT_AMBULATORY_CARE_PROVIDER_SITE_OTHER): Payer: Medicare HMO | Admitting: Physical Therapy

## 2019-11-10 ENCOUNTER — Encounter: Payer: Self-pay | Admitting: Physical Therapy

## 2019-11-10 DIAGNOSIS — M62838 Other muscle spasm: Secondary | ICD-10-CM | POA: Diagnosis not present

## 2019-11-10 DIAGNOSIS — M542 Cervicalgia: Secondary | ICD-10-CM | POA: Diagnosis not present

## 2019-11-10 NOTE — Therapy (Signed)
[facility] [address] [place], [place], [address] Phone: [phone]   Fax:  [phone]  Physical Therapy Treatment  Patient Details  Name: Courtney Hood MRN: [id number] Date of Birth: 1946-02-20 Referring Provider (PT): Lynne Leader   Encounter Date: 11/10/2019  PT End of Session - 11/10/19 1446    Visit Number  4    Number of Visits  12    Date for PT Re-Evaluation  12/13/19    Authorization Type  [employer]    PT Start Time  [id number]    PT Stop Time  1513    PT Time Calculation (min)  38 min    Activity Tolerance  Patient tolerated treatment well    Behavior During Therapy  [facility] for tasks assessed/performed       Past Medical History:  Diagnosis Date  . Osteopenia of femoral neck, bilateral   . Primary hyperparathyroidism ([place])     Past Surgical History:  Procedure Laterality Date  . ABDOMINAL HYSTERECTOMY    . APPENDECTOMY      There were no vitals filed for this visit.  Subjective Assessment - 11/10/19 1446    Subjective  Pt states mild improvments in pain. Less tingling into fingers..    Currently in Pain?  Yes    Pain Score  3     Pain Location  Shoulder    Pain Orientation  Right;Posterior    Pain Descriptors / Indicators  Aching    Pain Type  Acute pain    Pain Onset  1 to 4 weeks ago    Pain Frequency  Intermittent                       [facility] Adult PT Treatment/Exercise - 11/10/19 1443      Exercises   Exercises  Neck      Neck Exercises: Machines for Strengthening   UBE (Upper Arm Bike)  x4 min      Neck Exercises: Theraband   Rows  Red;20 reps    Shoulder External Rotation  20 reps    Shoulder External Rotation Limitations  RtB,  bil    Shoulder Internal Rotation  20 reps    Shoulder Internal Rotation Limitations  RTB      Neck Exercises: Standing   Other Standing Exercises  Shoulder ABD arom, x10;       Neck Exercises: Seated   Other Seated Exercise  Shoulder pully,  flexion and abd  x15ea ;       Neck Exercises: Supine   Other Supine Exercise  Shoulder flexion 1.5 lb, cane x15;  SA punch cane x15;       Manual Therapy   Manual Therapy  Joint mobilization;Soft tissue mobilization;Passive ROM;Manual Traction    Manual therapy comments  --    Joint Mobilization  scap mobs (s/l)     Soft tissue mobilization  subscap release     Manual Traction  cervical 10 sec x8;       Neck Exercises: Stretches   Upper Trapezius Stretch  --    Corner Stretch  --    Other Neck Stretches  Posterior shoulder stretch 30 sec x3 R;     Other Neck Stretches  S/L lat stretch overhead 30 sec x3;                PT Short Term Goals - 11/04/19 1140      PT SHORT TERM GOAL #1  Title  Pt to be independent with initial HEP    Time  2    Period  Weeks    Status  New    Target Date  11/15/19      PT SHORT TERM GOAL #2   Title  Pt to report decreased pain in R posterior shoulder and cervical region, to 0-3/10    Time  2    Period  Weeks    Status  New    Target Date  11/15/19        PT Long Term Goals - 11/04/19 1140      PT LONG TERM GOAL #1   Title  Pt to be independent with final HEP    Time  6    Period  Weeks    Status  New    Target Date  12/12/18      PT LONG TERM GOAL #2   Title  Pt to report decreased pain in shoulder/neck region to 0-2/10 with activity    Time  6    Period  Weeks    Status  New    Target Date  12/12/18      PT LONG TERM GOAL #3   Title  Pt to demo soft tissue restrictions in cervical and shoulder blade region to be WNL, to improve pain.    Time  6    Period  Weeks    Status  New    Target Date  12/12/18      PT LONG TERM GOAL #4   Title  Pt to demo full cervical and shoulder AROM without increased pain to improve ability for IADLS.    Time  6    Period  Weeks    Status  New    Target Date  12/12/18            Plan - 11/10/19 1614    Clinical Impression Statement  Pt with soreness and tightness in lat  with full shoulder flexion stretches, and pain in subscap with manual today which reproduced hand tingling. Added stretching for lat to HEP. Pt improving with ability for AROM/abduction and ther ex.    Examination-Activity Limitations  Reach Overhead;Sleep;Dressing;Lift    Examination-Participation Restrictions  Church;Cleaning;Community Activity;Shop;Laundry    Stability/Clinical Decision Making  Evolving/Moderate complexity    Rehab Potential  Good    PT Frequency  2x / week    PT Duration  6 weeks    PT Treatment/Interventions  ADLs/Self Care Home Management;Cryotherapy;Electrical Stimulation;Ultrasound;Traction;Moist Heat;Iontophoresis 4mg /ml Dexamethasone;Functional mobility training;Therapeutic activities;Therapeutic exercise;Neuromuscular re-education;Manual techniques;Patient/family education;Passive range of motion;Dry needling;Taping;Joint Manipulations;Spinal Manipulations    PT Home Exercise Plan  [id number]    Consulted and Agree with Plan of Care  Patient       Patient will benefit from skilled therapeutic intervention in order to improve the following deficits and impairments:  Decreased range of motion, Increased muscle spasms, Decreased activity tolerance, Pain, Decreased strength, Decreased mobility  Visit Diagnosis: Cervicalgia  Other muscle spasm     Problem List Patient Active Problem List   Diagnosis Date Noted  . Moderate episode of recurrent major depressive disorder ([place]) 03/16/2018  . Supraspinatus tendon tear, left, initial encounter 03/07/2017  . Hair loss 01/30/2017  . B12 deficiency 08/15/2015  . Right lumbar radiculopathy 04/14/2015  . Primary hyperparathyroidism ([place]) 08/27/2010  . Hyperlipidemia 07/25/2010  . Major depressive disorder 07/25/2010  . Osteopenia of femoral neck, bilateral 07/25/2010    Lyndee Hensen, PT, DPT 4:18 PM  11/10/19  [facility] [address] [place], [place],  [address] Phone: [phone]   Fax:  [phone]  Name: Catrinia Riggins MRN: [id number] Date of Birth: October 15, 1946

## 2019-11-15 ENCOUNTER — Ambulatory Visit (INDEPENDENT_AMBULATORY_CARE_PROVIDER_SITE_OTHER): Payer: Medicare HMO | Admitting: Physical Therapy

## 2019-11-15 ENCOUNTER — Encounter: Payer: Self-pay | Admitting: Physical Therapy

## 2019-11-15 DIAGNOSIS — M542 Cervicalgia: Secondary | ICD-10-CM

## 2019-11-15 DIAGNOSIS — M62838 Other muscle spasm: Secondary | ICD-10-CM | POA: Diagnosis not present

## 2019-11-15 DIAGNOSIS — M5412 Radiculopathy, cervical region: Secondary | ICD-10-CM | POA: Diagnosis not present

## 2019-11-16 NOTE — Therapy (Signed)
[facility] [address] [place], [place], [address] Phone: [phone]   Fax:  [phone]  Physical Therapy Treatment  Patient Details  Name: Courtney Hood MRN: [id number] Date of Birth: 1946-10-24 Referring Provider (PT): Lynne Leader   Encounter Date: 11/15/2019  PT End of Session - 11/15/19 1355    Visit Number  5    Number of Visits  12    Date for PT Re-Evaluation  12/13/19    Authorization Type  [employer]    PT Start Time  1350    PT Stop Time  1428    PT Time Calculation (min)  38 min    Activity Tolerance  Patient tolerated treatment well    Behavior During Therapy  [facility] for tasks assessed/performed       Past Medical History:  Diagnosis Date  . Osteopenia of femoral neck, bilateral   . Primary hyperparathyroidism ([place])     Past Surgical History:  Procedure Laterality Date  . ABDOMINAL HYSTERECTOMY    . APPENDECTOMY      There were no vitals filed for this visit.  Subjective Assessment - 11/15/19 1354    Subjective  Pt states quite a bit of pain relief. Now states pain only when she is laying on that side, or with certain movements, [id number].    Currently in Pain?  Yes    Pain Location  Shoulder    Pain Orientation  Right;Posterior    Pain Descriptors / Indicators  Aching    Pain Type  Acute pain    Pain Onset  1 to 4 weeks ago    Pain Frequency  Intermittent                       [facility] Adult PT Treatment/Exercise - 11/16/19 1218      Exercises   Exercises  Neck      Neck Exercises: Machines for Strengthening   UBE (Upper Arm Bike)  x4 min      Neck Exercises: Theraband   Rows  20 reps;Green    Shoulder External Rotation  20 reps    Shoulder External Rotation Limitations  RtB,  bil    Shoulder Internal Rotation  20 reps    Shoulder Internal Rotation Limitations  RTB    Horizontal ABduction  Red;20 reps      Neck Exercises: Standing   Other Standing Exercises  Shoulder ABD  and scaption  arom, x10 each       Neck Exercises: Seated   Other Seated Exercise  Shoulder pully, flexion and abd  x15ea ;       Neck Exercises: Supine   Other Supine Exercise  Shoulder flexion 1.5 lb, cane x15;  SA punch cane x15;       Manual Therapy   Manual Therapy  Joint mobilization;Soft tissue mobilization;Passive ROM;Manual Traction    Joint Mobilization  scap mobs (s/l) , R shoulder mobs inf and post     Soft tissue mobilization  subscap release     Passive ROM  for R shoulder     Manual Traction  cervical 10 sec x5;       Neck Exercises: Stretches   Other Neck Stretches  Posterior shoulder stretch 30 sec x3 R;     Other Neck Stretches  S/L lat stretch overhead 30 sec x3;        Trigger Point Dry Needling - 11/16/19 0001    Consent Given?  Yes  Education Handout Provided  Previously provided    Muscles Treated Upper Quadrant  Infraspinatus    Infraspinatus Response  Twitch response elicited;Palpable increased muscle length   R            PT Short Term Goals - 11/04/19 1140      PT SHORT TERM GOAL #1   Title  Pt to be independent with initial HEP    Time  2    Period  Weeks    Status  New    Target Date  11/15/19      PT SHORT TERM GOAL #2   Title  Pt to report decreased pain in R posterior shoulder and cervical region, to 0-3/10    Time  2    Period  Weeks    Status  New    Target Date  11/15/19        PT Long Term Goals - 11/04/19 1140      PT LONG TERM GOAL #1   Title  Pt to be independent with final HEP    Time  6    Period  Weeks    Status  New    Target Date  12/12/18      PT LONG TERM GOAL #2   Title  Pt to report decreased pain in shoulder/neck region to 0-2/10 with activity    Time  6    Period  Weeks    Status  New    Target Date  12/12/18      PT LONG TERM GOAL #3   Title  Pt to demo soft tissue restrictions in cervical and shoulder blade region to be WNL, to improve pain.    Time  6    Period  Weeks    Status  New     Target Date  12/12/18      PT LONG TERM GOAL #4   Title  Pt to demo full cervical and shoulder AROM without increased pain to improve ability for IADLS.    Time  6    Period  Weeks    Status  New    Target Date  12/12/18            Plan - 11/16/19 1213    Clinical Impression Statement  Pt with improving pain overall. She does still have palpable pain that reproduces pain and UE symptoms in infraspinatus region. Manual continued for this today, as well as PROM and joint mobs for shoulder. Pt responding well to treatment, will benefit from continued care.    Examination-Activity Limitations  Reach Overhead;Sleep;Dressing;Lift    Examination-Participation Restrictions  Church;Cleaning;Community Activity;Shop;Laundry    Stability/Clinical Decision Making  Evolving/Moderate complexity    Rehab Potential  Good    PT Frequency  2x / week    PT Duration  6 weeks    PT Treatment/Interventions  ADLs/Self Care Home Management;Cryotherapy;Electrical Stimulation;Ultrasound;Traction;Moist Heat;Iontophoresis 4mg /ml Dexamethasone;Functional mobility training;Therapeutic activities;Therapeutic exercise;Neuromuscular re-education;Manual techniques;Patient/family education;Passive range of motion;Dry needling;Taping;Joint Manipulations;Spinal Manipulations    PT Home Exercise Plan  [id number]    Consulted and Agree with Plan of Care  Patient       Patient will benefit from skilled therapeutic intervention in order to improve the following deficits and impairments:  Decreased range of motion, Increased muscle spasms, Decreased activity tolerance, Pain, Decreased strength, Decreased mobility  Visit Diagnosis: Cervicalgia  Other muscle spasm  Cervical radiculitis     Problem List Patient Active Problem List   Diagnosis Date Noted  .  Moderate episode of recurrent major depressive disorder ([place]) 03/16/2018  . Supraspinatus tendon tear, left, initial encounter 03/07/2017  . Hair loss 01/30/2017   . B12 deficiency 08/15/2015  . Right lumbar radiculopathy 04/14/2015  . Primary hyperparathyroidism ([place]) 08/27/2010  . Hyperlipidemia 07/25/2010  . Major depressive disorder 07/25/2010  . Osteopenia of femoral neck, bilateral 07/25/2010   Lyndee Hensen, PT, DPT 12:52 PM  11/16/19    [facility] [place], [place], [address] Phone: [phone]   Fax:  [phone]  Name: Navika Gogola MRN: [id number] Date of Birth: 31-Jan-1946

## 2019-11-17 ENCOUNTER — Ambulatory Visit (INDEPENDENT_AMBULATORY_CARE_PROVIDER_SITE_OTHER): Payer: Medicare HMO | Admitting: Physical Therapy

## 2019-11-17 ENCOUNTER — Encounter: Payer: Self-pay | Admitting: Physical Therapy

## 2019-11-17 DIAGNOSIS — M62838 Other muscle spasm: Secondary | ICD-10-CM | POA: Diagnosis not present

## 2019-11-17 DIAGNOSIS — M5412 Radiculopathy, cervical region: Secondary | ICD-10-CM

## 2019-11-17 DIAGNOSIS — M542 Cervicalgia: Secondary | ICD-10-CM | POA: Diagnosis not present

## 2019-11-17 NOTE — Therapy (Addendum)
[facility] [address] [place], [place], [address] Phone: [phone]   Fax:  [phone]  Physical Therapy Treatment  Patient Details  Name: Courtney Hood MRN: [id number] Date of Birth: 09/06/1946 Referring Provider (PT): Evan Corey   Encounter Date: 11/17/2019  PT End of Session - 11/17/19 1517     Visit Number  6    Number of Visits  12    Date for PT Re-Evaluation  12/13/19    Authorization Type  [employer]    PT Start Time  1345    PT Stop Time  1428    PT Time Calculation (min)  43 min    Activity Tolerance  Patient tolerated treatment well    Behavior During Therapy  [facility] for tasks assessed/performed        Past Medical History:  Diagnosis Date   Osteopenia of femoral neck, bilateral    Primary hyperparathyroidism ([facility])     Past Surgical History:  Procedure Laterality Date   ABDOMINAL HYSTERECTOMY     APPENDECTOMY      There were no vitals filed for this visit.  Subjective Assessment - 11/17/19 1428     Subjective  Pt states continued improvements in pain.    Currently in Pain?  Yes    Pain Score  1     Pain Location  Shoulder    Pain Orientation  Right;Posterior    Pain Descriptors / Indicators  Aching    Pain Type  Acute pain    Pain Onset  1 to 4 weeks ago    Pain Frequency  Intermittent                        [facility] Adult PT Treatment/Exercise - 11/17/19 1348       Exercises   Exercises  Neck      Neck Exercises: Machines for Strengthening   UBE (Upper Arm Bike)  x4 min      Neck Exercises: Theraband   Rows  20 reps;Green    Shoulder External Rotation  --    Shoulder External Rotation Limitations  --    Shoulder Internal Rotation  --    Shoulder Internal Rotation Limitations  --    Horizontal ABduction  --      Neck Exercises: Standing   Other Standing Exercises  Shoulder ABD and scaption  arom, 1lb, x10 each     Other Standing Exercises  wall push ups x15; SA  press x15;       Neck Exercises: Seated   Other Seated Exercise  Shoulder pully, flexion and abd  x15ea ;       Neck Exercises: Supine   Other Supine Exercise  Shoulder flexion 1.5 lb, cane x15;        Neck Exercises: Prone   Other Prone Exercise  Row, I, T, x15 each, Y x10.       Modalities   Modalities  Ultrasound      Ultrasound   Ultrasound Location  R posterior shoulder/ infraspinatus    Ultrasound Parameters  Phono/ With Dex  x8 min;     Ultrasound Goals  Pain      Manual Therapy   Manual Therapy  Joint mobilization;Soft tissue mobilization;Passive ROM;Manual Traction    Joint Mobilization  scap mobs (s/l) , R shoulder mobs inf and post     Soft tissue mobilization  subscap release     Passive ROM  for R shoulder       Manual Traction  --      Neck Exercises: Stretches   Other Neck Stretches  Posterior shoulder stretch 30 sec x3 R;     Other Neck Stretches  S/L lat stretch overhead 30 sec x3;                 PT Short Term Goals - 11/17/19 1518       PT SHORT TERM GOAL #1   Title  Pt to be independent with initial HEP    Time  2    Period  Weeks    Status  Achieved    Target Date  11/15/19      PT SHORT TERM GOAL #2   Title  Pt to report decreased pain in R posterior shoulder and cervical region, to 0-3/10    Time  2    Period  Weeks    Status  Achieved    Target Date  11/15/19         PT Long Term Goals - 11/04/19 1140       PT LONG TERM GOAL #1   Title  Pt to be independent with final HEP    Time  6    Period  Weeks    Status  New    Target Date  12/12/18      PT LONG TERM GOAL #2   Title  Pt to report decreased pain in shoulder/neck region to 0-2/10 with activity    Time  6    Period  Weeks    Status  New    Target Date  12/12/18      PT LONG TERM GOAL #3   Title  Pt to demo soft tissue restrictions in cervical and shoulder blade region to be WNL, to improve pain.    Time  6    Period  Weeks    Status  New    Target Date   12/12/18      PT LONG TERM GOAL #4   Title  Pt to demo full cervical and shoulder AROM without increased pain to improve ability for IADLS.    Time  6    Period  Weeks    Status  New    Target Date  12/12/18             Plan - 11/17/19 1520     Clinical Impression Statement  Pt with improving pain. She has improved ability for shoulder AROM and strengthening without pain in posterior shoulder. SHe has mild pain with palpation on infraspinatus on R. She also has increased tingling in UE with prone horizontal Abd strengthening today. Pt making good progress, will benefit from continued care.    Examination-Activity Limitations  Reach Overhead;Sleep;Dressing;Lift    Examination-Participation Restrictions  Church;Cleaning;Community Activity;Shop;Laundry    Stability/Clinical Decision Making  Evolving/Moderate complexity    Rehab Potential  Good    PT Frequency  2x / week    PT Duration  6 weeks    PT Treatment/Interventions  ADLs/Self Care Home Management;Cryotherapy;Electrical Stimulation;Ultrasound;Traction;Moist Heat;Iontophoresis 4mg/ml Dexamethasone;Functional mobility training;Therapeutic activities;Therapeutic exercise;Neuromuscular re-education;Manual techniques;Patient/family education;Passive range of motion;Dry needling;Taping;Joint Manipulations;Spinal Manipulations    PT Home Exercise Plan  [id number]    Consulted and Agree with Plan of Care  Patient        Patient will benefit from skilled therapeutic intervention in order to improve the following deficits and impairments:  Decreased range of motion, Increased muscle spasms, Decreased activity tolerance, Pain, Decreased strength, Decreased mobility    Visit Diagnosis: Cervicalgia  Other muscle spasm  Cervical radiculitis     Problem List Patient Active Problem List   Diagnosis Date Noted   Moderate episode of recurrent major depressive disorder ([facility]) 03/16/2018   Supraspinatus tendon tear, left, initial  encounter 03/07/2017   Hair loss 01/30/2017   B12 deficiency 08/15/2015   Right lumbar radiculopathy 04/14/2015   Primary hyperparathyroidism ([facility]) 08/27/2010   Hyperlipidemia 07/25/2010   Major depressive disorder 07/25/2010   Osteopenia of femoral neck, bilateral 07/25/2010     , PT, DPT 3:22 PM  11/17/19    [facility] [address] [place], [place], [address] Phone: [phone]   Fax:  [phone]  Name: Courtney Hood MRN: [id number] Date of Birth: 11/02/1946  PHYSICAL THERAPY DISCHARGE SUMMARY  Visits from Start of Care: 6 Plan: Patient agrees to discharge.  Patient goals were met. Patient is being discharged due to meeting the stated rehab goals.     Pt called (dec 2020) to say that she was feeling better, no pain in arm/shoulder.    , PT, DPT 2:18 PM  12/31/21     

## 2019-11-22 ENCOUNTER — Encounter: Payer: Medicare HMO | Admitting: Physical Therapy

## 2019-11-23 ENCOUNTER — Encounter: Payer: Self-pay | Admitting: Family Medicine

## 2019-11-24 ENCOUNTER — Encounter: Payer: Medicare HMO | Admitting: Physical Therapy

## 2019-11-30 ENCOUNTER — Encounter: Payer: Medicare HMO | Admitting: Physical Therapy

## 2019-11-30 ENCOUNTER — Ambulatory Visit: Payer: Medicare HMO

## 2020-01-03 ENCOUNTER — Ambulatory Visit: Payer: Medicare HMO

## 2020-01-10 ENCOUNTER — Ambulatory Visit: Payer: Medicare HMO | Attending: Internal Medicine

## 2020-01-10 ENCOUNTER — Ambulatory Visit: Payer: Medicare HMO

## 2020-01-10 DIAGNOSIS — Z23 Encounter for immunization: Secondary | ICD-10-CM | POA: Insufficient documentation

## 2020-01-10 NOTE — Progress Notes (Signed)
   Covid-19 Vaccination Clinic  Name:  Courtney Hood    MRN: [id number] DOB: [birth year]  01/10/2020  Courtney Hood was observed post Covid-19 immunization for 15 minutes without incidence. She was provided with Vaccine Information Sheet and instruction to access the V-Safe system.   Courtney Hood was instructed to call 911 with any severe reactions post vaccine: Marland Kitchen Difficulty breathing  . Swelling of your face and throat  . A fast heartbeat  . A bad rash all over your body  . Dizziness and weakness    Immunizations Administered    Name Date Dose VIS Date Route   Pfizer COVID-19 Vaccine 01/10/2020  3:14 PM 0.3 mL 11/12/2019 Intramuscular   Manufacturer: [place]   Lot: [id number]   [place]: [id number]

## 2020-02-04 ENCOUNTER — Ambulatory Visit: Payer: Medicare HMO | Attending: Internal Medicine

## 2020-02-04 DIAGNOSIS — Z23 Encounter for immunization: Secondary | ICD-10-CM | POA: Insufficient documentation

## 2020-02-04 NOTE — Progress Notes (Signed)
   Covid-19 Vaccination Clinic  Name:  Courtney Hood    MRN: [id number] DOB: 09-10-1946  02/04/2020  [name] was observed post Covid-19 immunization for 15 minutes without incident. She was provided with Vaccine Information Sheet and instruction to access the V-Safe system.   [name] was instructed to call 911 with any severe reactions post vaccine: Marland Kitchen Difficulty breathing  . Swelling of face and throat  . A fast heartbeat  . A bad rash all over body  . Dizziness and weakness   Immunizations Administered    Name Date Dose VIS Date Route   Pfizer COVID-19 Vaccine 02/04/2020 12:50 AM 0.3 mL 11/12/2019 Intramuscular   Manufacturer: [place]   Lot: [id number]   [place]: [id number]

## 2020-03-09 ENCOUNTER — Telehealth: Payer: Self-pay | Admitting: Family Medicine

## 2020-03-09 NOTE — Telephone Encounter (Signed)
Left message for patient to call back and schedule Medicare Annual Wellness Visit (AWV) either virtually/audio only OR in office. Whatever the patients preference is.  Last AWV 08/15/15; please schedule at anytime with [facility]Nurse Health Advisor at [facility].

## 2020-04-14 ENCOUNTER — Other Ambulatory Visit: Payer: Self-pay | Admitting: Family Medicine

## 2020-04-14 DIAGNOSIS — E7800 Pure hypercholesterolemia, unspecified: Secondary | ICD-10-CM

## 2020-04-17 ENCOUNTER — Other Ambulatory Visit: Payer: Self-pay | Admitting: Family Medicine

## 2020-04-17 DIAGNOSIS — F3342 Major depressive disorder, recurrent, in full remission: Secondary | ICD-10-CM

## 2020-04-17 NOTE — Telephone Encounter (Signed)
Please review

## 2020-06-21 ENCOUNTER — Encounter: Payer: Self-pay | Admitting: Family Medicine

## 2020-06-23 ENCOUNTER — Ambulatory Visit (INDEPENDENT_AMBULATORY_CARE_PROVIDER_SITE_OTHER): Payer: Medicare HMO | Admitting: Family Medicine

## 2020-06-23 ENCOUNTER — Encounter: Payer: Self-pay | Admitting: Family Medicine

## 2020-06-23 ENCOUNTER — Other Ambulatory Visit: Payer: Self-pay

## 2020-06-23 VITALS — BP 90/54 | HR 71 | Temp 97.3°F | Ht 67.0 in | Wt 127.2 lb

## 2020-06-23 DIAGNOSIS — F410 Panic disorder [episodic paroxysmal anxiety] without agoraphobia: Secondary | ICD-10-CM

## 2020-06-23 DIAGNOSIS — F331 Major depressive disorder, recurrent, moderate: Secondary | ICD-10-CM | POA: Diagnosis not present

## 2020-06-23 MED ORDER — CLONAZEPAM 0.5 MG PO TABS: 0.5000 mg | ORAL_TABLET | Freq: Every day | ORAL | 0 refills | Status: DC | PRN

## 2020-06-23 NOTE — Progress Notes (Addendum)
Patient: Courtney Hood MRN: [id number] DOB: December 25, 1945 PCP: Leamon Arnt, MD     Subjective:  Chief Complaint  Patient presents with  . Depression  . panic attacks    HPI: The patient is a 74 y.o. female who presents today for Depression/panic attacks. She has been on anti depressants for quite some time and states every so often she has to have them changed. She was doing great during pandemic and was very busy, but in February everything stopped so she has a lot more time. She also downsized from a house to a condo.  She started to have panic attacks about 3 months ago.  She states when she wakes up it feels like she is having a panic attack. She states it takes about 20 minutes to 30 minutes to go away. She has headache, dizziness, butterflies in stomach, chest tightness, heart racing. She also started to have night sweats again. She has never had panic attacks in the past. She has been on wellbutrin and zoloft for a couple of years. She is not in counseling or exercising. No other panic attacks throughout the day. She states she just doesn't want to start her day. She is trying to fill her day with activities.   She has no sweating, wheezing, cough, diarrhea, labile blood pressures with these episodes.   Review of Systems  Respiratory: Negative for chest tightness.   Cardiovascular: Negative for chest pain and palpitations.  Neurological: Negative for dizziness and headaches.  Psychiatric/Behavioral: The patient is nervous/anxious.     Allergies Patient is allergic to actonel [risedronate sodium].  Past Medical History Patient  has a past medical history of Osteopenia of femoral neck, bilateral and Primary hyperparathyroidism ([place]).  Surgical History Patient  has a past surgical history that includes Appendectomy and Abdominal hysterectomy.  Family History Pateint's family history includes Colon cancer (age of onset: 11) in her brother; Leukemia in her father.  Social  History Patient  reports that she quit smoking about 20 years ago. She has never used smokeless tobacco. She reports current alcohol use. She reports that she does not use drugs.    Objective: Vitals:   06/23/20 1109  BP: (!) 90/54  Pulse: 71  Temp: (!) 97.3 F (36.3 C)  TempSrc: Temporal  SpO2: 96%  Weight: 127 lb 3.2 oz (57.7 kg)  Height: 5\' 7"  (1.702 m)    Body mass index is 19.92 kg/m.  Physical Exam Vitals reviewed.  Constitutional:      Appearance: Normal appearance. She is normal weight.  HENT:     Head: Normocephalic and atraumatic.  Eyes:     Extraocular Movements: Extraocular movements intact.     Pupils: Pupils are equal, round, and reactive to light.  Cardiovascular:     Rate and Rhythm: Normal rate and regular rhythm.     Heart sounds: Normal heart sounds.  Pulmonary:     Effort: Pulmonary effort is normal.     Breath sounds: Normal breath sounds.  Abdominal:     General: Abdomen is flat. Bowel sounds are normal.     Palpations: Abdomen is soft.  Skin:    General: Skin is warm.     Capillary Refill: Capillary refill takes less than 2 seconds.  Neurological:     General: No focal deficit present.     Mental Status: She is alert and oriented to person, place, and time.  Psychiatric:        Mood and Affect: Mood normal.  Behavior: Behavior normal.          Office Visit from 06/23/2020 in [place]  PHQ-9 Total Score 15     GAD 7 : Generalized Anxiety Score 06/23/2020  Nervous, Anxious, on Edge 2  Control/stop worrying 3  Worry too much - different things 2  Trouble relaxing 0  Restless 0  Easily annoyed or irritable 2  Afraid - awful might happen 0  Total GAD 7 Score 9  Anxiety Difficulty Very difficult      Assessment/plan: 1. Moderate episode of recurrent major depressive disorder (HCC) phq9 moderate score. I think she does have some depression associated with this and some of this is situational with  moving and not being so busy. Im going to increase her zoloft to 100mg /day and continue wellbutrin. Discussed wellbutrin can make anxiety worse, but she's been on for a long time and done well. F/u with pcp in one month.   2. Panic attacks -checking labs, has been awhile.  Gad 7 score is mild, but she only has events in the AM and doesn't struggle with anxiety during the day. Increasing zoloft to 100mg /day and adding on klonopin prn for panic attacks. Discussed exercise and counseling. Side effects of medication discussed and drowsy precautions given. F/u with pcp in one months time or sooner if needed.    This visit occurred during the SARS-CoV-2 public health emergency.  Safety protocols were in place, including screening questions prior to the visit, additional usage of staff PPE, and extensive cleaning of exam room while observing appropriate contact time as indicated for disinfecting solutions.    Return in about 1 month (around 07/24/2020) for panic attacks. Orma Flaming, MD [place]    06/23/2020

## 2020-06-23 NOTE — Patient Instructions (Addendum)
-  increasing zoloft to 100mg /day.  -staring you on a drug called klonopin that you will take as needed for panic attacks. May make you drowsy!  -encourage exercise and getting involved -counseling may be helpful as well.   Hang in there! F/u with Dr. Jonni Sanger in one month!  Nice to meet you,  Dr. Rogers Blocker

## 2020-06-24 LAB — CBC WITH DIFFERENTIAL/PLATELET
Absolute Monocytes: 478 cells/uL (ref 200–950)
Basophils Absolute: 31 cells/uL (ref 0–200)
Basophils Relative: 0.6 %
Eosinophils Absolute: 140 cells/uL (ref 15–500)
Eosinophils Relative: 2.7 %
HCT: 44.7 % (ref 35.0–45.0)
Hemoglobin: 14.4 g/dL (ref 11.7–15.5)
Lymphs Abs: 1841 cells/uL (ref 850–3900)
MCH: 29.4 pg (ref 27.0–33.0)
MCHC: 32.2 g/dL (ref 32.0–36.0)
MCV: 91.4 fL (ref 80.0–100.0)
MPV: 9.9 fL (ref 7.5–12.5)
Monocytes Relative: 9.2 %
Neutro Abs: 2709 cells/uL (ref 1500–7800)
Neutrophils Relative %: 52.1 %
Platelets: 221 10*3/uL (ref 140–400)
RBC: 4.89 10*6/uL (ref 3.80–5.10)
RDW: 12.5 % (ref 11.0–15.0)
Total Lymphocyte: 35.4 %
WBC: 5.2 10*3/uL (ref 3.8–10.8)

## 2020-06-24 LAB — COMPREHENSIVE METABOLIC PANEL
AG Ratio: 1.8 (calc) (ref 1.0–2.5)
ALT: 9 U/L (ref 6–29)
AST: 22 U/L (ref 10–35)
Albumin: 4.2 g/dL (ref 3.6–5.1)
Alkaline phosphatase (APISO): 48 U/L (ref 37–153)
BUN: 19 mg/dL (ref 7–25)
CO2: 27 mmol/L (ref 20–32)
Calcium: 10.5 mg/dL — ABNORMAL HIGH (ref 8.6–10.4)
Chloride: 107 mmol/L (ref 98–110)
Creat: 0.92 mg/dL (ref 0.60–0.93)
Globulin: 2.3 g/dL (calc) (ref 1.9–3.7)
Glucose, Bld: 85 mg/dL (ref 65–99)
Potassium: 4.2 mmol/L (ref 3.5–5.3)
Sodium: 141 mmol/L (ref 135–146)
Total Bilirubin: 0.5 mg/dL (ref 0.2–1.2)
Total Protein: 6.5 g/dL (ref 6.1–8.1)

## 2020-06-24 LAB — TSH: TSH: 2.65 mIU/L (ref 0.40–4.50)

## 2020-07-06 ENCOUNTER — Other Ambulatory Visit: Payer: Self-pay | Admitting: Family Medicine

## 2020-07-14 ENCOUNTER — Other Ambulatory Visit: Payer: Self-pay

## 2020-07-14 ENCOUNTER — Encounter: Payer: Self-pay | Admitting: Family Medicine

## 2020-07-14 ENCOUNTER — Other Ambulatory Visit: Payer: Self-pay | Admitting: Family Medicine

## 2020-07-14 ENCOUNTER — Ambulatory Visit (INDEPENDENT_AMBULATORY_CARE_PROVIDER_SITE_OTHER): Payer: Medicare HMO | Admitting: Family Medicine

## 2020-07-14 VITALS — BP 110/62 | HR 82 | Temp 98.0°F | Ht 67.0 in | Wt 127.0 lb

## 2020-07-14 DIAGNOSIS — F331 Major depressive disorder, recurrent, moderate: Secondary | ICD-10-CM | POA: Diagnosis not present

## 2020-07-14 NOTE — Progress Notes (Signed)
Subjective  CC:  Chief Complaint  Patient presents with  . Depression    states Klonopan dosage is too strong, no improvement since last visit     HPI: Courtney Hood is a 74 y.o. female who presents to the office today to address the problems listed above in the chief complaint, mood problems.  I reviewed the notes from recent visit for mood problems.  In summary, increased depressive symptoms since February.  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  Since, she is fatigued, tired, unmotivated to do things and down.  She does have trouble awakening in the morning.  Her symptoms sound more dread like.  Feels tired, heaviness in the chest, panic to get the day going when she does not feel like it.  She denies palpitations, sweats, shortness of breath, paresthesias or feelings of doom.  No history of panic.  She increased her Zoloft 200 mg about 2 weeks ago She denies current suicidal or homicidal plan or intent.   Long history of depression treated with low-dose Zoloft and has been well controlled.  Home life is good.  Supportive husband. Depression screen [facility] 2/9 06/23/2020 01/20/2019 03/16/2018  Decreased Interest 3 1 1   Down, Depressed, Hopeless 3 1 3   PHQ - 2 Score 6 2 4   Altered sleeping 2 1 1   Tired, decreased energy 3 0 0  Change in appetite 1 1 2   Feeling bad or failure about yourself  3 0 1  Trouble concentrating 0 0 0  Moving slowly or fidgety/restless 0 0 0  Suicidal thoughts 0 1 0  PHQ-9 Score 15 5 8   Difficult doing work/chores Extremely dIfficult Somewhat difficult Somewhat difficult   GAD 7 : Generalized Anxiety Score 06/23/2020  Nervous, Anxious, on Edge 2  Control/stop worrying 3  Worry too much - different things 2  Trouble relaxing 0  Restless 0  Easily annoyed or irritable 2  Afraid - awful might happen 0  Total GAD 7 Score 9  Anxiety Difficulty Very difficult    Assessment  1. Moderate episode of recurrent major depressive disorder (HCC)       Plan   Depression: Continue 100 g Zoloft for next 2 weeks, increase to 150 mg if not starting to improve at that time.  Doubt she needs Klonopin.  Education given.  Follow-up 6 weeks.  Could increase to 200 mg if slightly improved for consider changing medicines, Prozac.  Reviewed concept of mood problems caused by biochemical imbalance of neurotransmitters and rationale for treatment with medications and therapy.   Counseling given: pt was instructed to contact office, on-call physician or crisis Hotline if symptoms worsen significantly. If patient develops any suicidal or homicidal thoughts, she is directed to the ER immediately.   Follow up: Return in about 6 weeks (around 08/25/2020) for mood follow up.  No orders of the defined types were placed in this encounter.  No orders of the defined types were placed in this encounter.     I reviewed the patients updated PMH, FH, and SocHx.    Patient Active Problem List   Diagnosis Date Noted  . Moderate episode of recurrent major depressive disorder ([place]) 03/16/2018  . B12 deficiency 08/15/2015  . Right lumbar radiculopathy 04/14/2015  . Primary hyperparathyroidism ([place]) 08/27/2010  . Mixed hyperlipidemia 07/25/2010  . Osteopenia of femoral neck, bilateral 07/25/2010   Current Meds  Medication Sig  . buPROPion (WELLBUTRIN XL) 150 MG 24 hr tablet Take 1 tablet (150 mg total)  by mouth daily. Please schedule transfer of care appt with Dr. Jonni Sanger for further refills [phone]  . Cholecalciferol 100 MCG (4000 UT) CAPS Take 1 capsule (4,000 Units total) by mouth daily. Take one tablet daily.  . clonazePAM (KLONOPIN) 0.5 MG tablet Take 1 tablet (0.5 mg total) by mouth daily as needed for anxiety.  . pravastatin (PRAVACHOL) 40 MG tablet TAKE 1 TABLET BY MOUTH EVERY DAY  . sertraline (ZOLOFT) 100 MG tablet Take 0.5 tablets (50 mg total) by mouth daily.    Allergies: Patient is allergic to actonel [risedronate sodium]. Family history:   Patient family history includes Colon cancer (age of onset: 97) in her brother; Leukemia in her father. Social History   Socioeconomic History  . Marital status: Married    Spouse name: Not on file  . Number of children: Not on file  . Years of education: Not on file  . Highest education level: Not on file  Occupational History  . Not on file  Tobacco Use  . Smoking status: Former Smoker    Quit date: 12/06/1999    Years since quitting: 20.6  . Smokeless tobacco: Never Used  . Tobacco comment: Married, lives wih spouse. Moved to [place] area from [place] in spring 2011  Substance and Sexual Activity  . Alcohol use: Yes    Comment: social-maybe 1 glass wine twice a month.   . Drug use: No  . Sexual activity: Not on file  Other Topics Concern  . Not on file  Social History Narrative   Goes by [employer].   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 Difficulty of Paying Living Expenses:   Food Insecurity:   . Worried About Charity fundraiser in the Last Year:   . Arboriculturist in the Last Year:   Transportation Needs:   . Film/video editor (Medical):   Marland Kitchen Lack of Transportation (Non-Medical):   Physical Activity:   . Days of Exercise per Week:   . Minutes of Exercise per Session:   Stress:   . Feeling of Stress :   Social Connections:   . Frequency of Communication with Friends and Family:   . Frequency of Social Gatherings with Friends and Family:   . Attends Religious Services:   . Active Member of Clubs or Organizations:   . Attends Archivist Meetings:   Marland Kitchen Marital Status:      Review of Systems: Constitutional: Negative for fever malaise or anorexia Cardiovascular: negative for chest pain Respiratory: negative for SOB or persistent cough Gastrointestinal: negative for abdominal pain  Objective   Vitals: BP 110/62   Pulse 82   Temp 98 F (36.7 C) (Temporal)   Ht 5\' 7"  (1.702 m)   Wt 127 lb (57.6 kg)   SpO2 96%   BMI 19.89 kg/m  General: no acute distress, well appearing, no apparent distress, well groomed Psych:  Alert and oriented x 3,normal mood, behavior, speech, dress, and thought processes.  Good insight     Commons side effects, risks, benefits, and alternatives for medications and treatment plan prescribed today were discussed, and the patient expressed understanding of the given instructions. Patient is instructed to call or message via MyChart if he/she has any questions or concerns regarding our treatment plan. No barriers to understanding were  identified. We discussed Red Flag symptoms and signs in detail. Patient expressed understanding regarding what to do in case of urgent or emergency type symptoms.   Medication list was reconciled, printed and provided to the patient in AVS. Patient instructions and summary information was reviewed with the patient as documented in the AVS. This note was prepared with assistance of Dragon voice recognition software. Occasional wrong-word or sound-a-like substitutions may have occurred due to the inherent limitations of voice recognition software

## 2020-07-14 NOTE — Patient Instructions (Signed)
Please return in 6 weeks for mood follow up AND please schedule a complete physical in 3-4 months as well.   Continue the zoloft at 100mg  daily for 2 more weeks: if not improving, increase further to 150mg  daily.  You can cut your klonopin in half however, I do not feel like your symptoms are anxiety related so you likely don't need this.   If you have any questions or concerns, please don't hesitate to send me a message via MyChart or call the office at [phone]. Thank you for visiting with [place] today! It's our pleasure caring for you.  When you're feeling too down to do anything, try these 10 Little things!  Take a shower. Even if you plan to stay in all day long and not see a soul, take a shower. It takes the most effort to hop in to the shower but once you do, you'll feel immediate results. It will wake you up and you'll be feeling much fresher (and cleaner too).  Brush and floss your teeth. Give your teeth a good brushing with a floss finish. It's a small task but it feels so good and you can check 'taking care of your health' off the list of things to do.  Do something small on your list. Most of [place] have some small thing we would like to get done (load of laundry, sew a button, email a friend). Doing one of these things will make you feel like you've accomplished something.  Drink water. Drinking water is easy right? It's also really beneficial for your health so keep a glass beside you all day and take sips often. It gives you energy and prevents you from boredom eating.  Do some floor exercises. The last thing you want to do is exercise but it might be just the thing you need the most. Keep it simple and do exercises that involve sitting or laying on the floor. Even the smallest of exercises release chemicals in the brain that make you feel good. Yoga stretches or core exercises are going to make you feel good with minimal effort.  Make your bed. Making your bed takes a few minutes  but it's productive and you'll feel relieved when it's done. An unmade bed is a huge visual reminder that you're having an unproductive day. Do it and consider it your housework for the day.  Put on some nice clothes. Take the sweatpants off even if you don't plan to go anywhere. Put on clothes that make you feel good. Take a look in the mirror so your brain recognizes the sweatpants have been replaced with clothes that make you look great. It's an instant confidence booster.  Wash the dishes. A pile of dirty dishes in the sink is a reflection of your mood. It's possible that if you wash up the dishes, your mood will follow suit. It's worth a try.  Cook a real meal. If you have the luxury to have a "do nothing" day, you have time to make a real meal for yourself. Make a meal that you love to eat. The process is good to get you out of the funk and the food will ensure you have more energy for tomorrow.  Write out your thoughts by hand. When you hand write, you stimulate your brain to focus on the moment that you're in so make yourself comfortable and write whatever comes into your mind. Put those thoughts out on paper so they stop spinning around in your head.  Those thoughts might be the very thing holding you down.

## 2020-08-25 ENCOUNTER — Encounter: Payer: Self-pay | Admitting: Family Medicine

## 2020-08-25 ENCOUNTER — Ambulatory Visit (INDEPENDENT_AMBULATORY_CARE_PROVIDER_SITE_OTHER): Payer: Medicare HMO | Admitting: Family Medicine

## 2020-08-25 ENCOUNTER — Other Ambulatory Visit: Payer: Self-pay

## 2020-08-25 VITALS — BP 114/70 | HR 85 | Temp 98.0°F | Resp 18 | Ht 67.0 in | Wt 127.8 lb

## 2020-08-25 DIAGNOSIS — Z23 Encounter for immunization: Secondary | ICD-10-CM | POA: Diagnosis not present

## 2020-08-25 DIAGNOSIS — F331 Major depressive disorder, recurrent, moderate: Secondary | ICD-10-CM | POA: Diagnosis not present

## 2020-08-25 MED ORDER — SERTRALINE HCL 100 MG PO TABS: ORAL_TABLET | ORAL | 2 refills | Status: DC

## 2020-08-25 NOTE — Progress Notes (Signed)
Subjective  CC:  Chief Complaint  Patient presents with  . Mood    Patient stated that her mood has gotten better. She has adjusted to the medication well.     HPI: Courtney Hood is a 74 y.o. female who presents to the office today to address the problems listed above in the chief complaint, mood problems.  See last note.  Moderate depression, increase sertraline to 100 mg.  She remains on Wellbutrin 150 daily.  Overall she reports that she is doing well with letter however after further interview it seems that she has changed her routine regimen really improving her mood.  She is sleeping later into the day to bypass the morning dread.  Her evenings are better filled with TV and reading, two things that she likes.  The increase in medication initially caused dizziness but this has resolved.  She is now tolerating it well her mood scores below show a mild improvement but depression is still active.  Depression screen [facility] 2/9 08/25/2020 06/23/2020 01/20/2019  Decreased Interest 2 3 1   Down, Depressed, Hopeless 3 3 1   PHQ - 2 Score 5 6 2   Altered sleeping 2 2 1   Tired, decreased energy 2 3 0  Change in appetite 0 1 1  Feeling bad or failure about yourself  3 3 0  Trouble concentrating 0 0 0  Moving slowly or fidgety/restless 0 0 0  Suicidal thoughts 0 0 1  PHQ-9 Score 12 15 5   Difficult doing work/chores Very difficult Extremely dIfficult Somewhat difficult  Some recent data might be hidden   GAD 7 : Generalized Anxiety Score 08/25/2020 07/14/2020 06/23/2020  Nervous, Anxious, on Edge 2 3 2   Control/stop worrying 2 2 3   Worry too much - different things 2 2 2   Trouble relaxing 1 3 0  Restless 1 0 0  Easily annoyed or irritable 2 3 2   Afraid - awful might happen 1 2 0  Total GAD 7 Score 11 15 9   Anxiety Difficulty - Very difficult Very difficult     Assessment  1. Moderate episode of recurrent major depressive disorder (HCC)      Plan   Depression: Active, increase  sertraline, titrate up from 150 up to 200 daily for the next 2 weeks.  Recheck 6 to 8 weeks.  Discussed monitoring her schedule reverting back to more of a normal routine so not to interfere with sleep.  Reviewed concept of mood problems caused by biochemical imbalance of neurotransmitters and rationale for treatment with medications and therapy.   Counseling given: pt was instructed to contact office, on-call physician or crisis Hotline if symptoms worsen significantly. If patient develops any suicidal or homicidal thoughts, she is directed to the ER immediately.  Flu shot given today Follow up: 8 weeks mood follow-up No orders of the defined types were placed in this encounter.  Meds ordered this encounter  Medications  . sertraline (ZOLOFT) 100 MG tablet    Sig: Take 1.5 tablets (150 mg total) by mouth daily for 7 days, THEN 2 tablets (200 mg total) daily for 21 days.    Dispense:  60 tablet    Refill:  2      I reviewed the patients updated PMH, FH, and SocHx.    Patient Active Problem List   Diagnosis Date Noted  . Moderate episode of recurrent major depressive disorder ([place]) 03/16/2018  . B12 deficiency 08/15/2015  . Right lumbar radiculopathy 04/14/2015  . Primary hyperparathyroidism ([place]) 08/27/2010  .  Mixed hyperlipidemia 07/25/2010  . Osteopenia of femoral neck, bilateral 07/25/2010   Current Meds  Medication Sig  . buPROPion (WELLBUTRIN XL) 150 MG 24 hr tablet Take 1 tablet (150 mg total) by mouth daily. Please schedule transfer of care appt with Dr. Jonni Sanger for further refills [phone]  . Cholecalciferol 100 MCG (4000 UT) CAPS Take 1 capsule (4,000 Units total) by mouth daily. Take one tablet daily.  . clonazePAM (KLONOPIN) 0.5 MG tablet Take 1 tablet (0.5 mg total) by mouth daily as needed for anxiety.  . pravastatin (PRAVACHOL) 40 MG tablet TAKE 1 TABLET BY MOUTH EVERY DAY  . sertraline (ZOLOFT) 100 MG tablet Take 1.5 tablets (150 mg total) by mouth daily for 7  days, THEN 2 tablets (200 mg total) daily for 21 days.  . [DISCONTINUED] sertraline (ZOLOFT) 100 MG tablet TAKE 1 TABLET BY MOUTH EVERY DAY    Allergies: Patient is allergic to actonel [risedronate sodium]. Family history:  Patient family history includes Colon cancer (age of onset: 27) in her brother; Leukemia in her father. Social History   Socioeconomic History  . Marital status: Married    Spouse name: Not on file  . Number of children: Not on file  . Years of education: Not on file  . Highest education level: Not on file  Occupational History  . Not on file  Tobacco Use  . Smoking status: Former Smoker    Quit date: 12/06/1999    Years since quitting: 20.7  . Smokeless tobacco: Never Used  . Tobacco comment: Married, lives wih spouse. Moved to [place] area from [place] in spring 2011  Substance and Sexual Activity  . Alcohol use: Yes    Comment: social-maybe 1 glass wine twice a month.   . Drug use: No  . Sexual activity: Not on file  Other Topics Concern  . Not on file  Social History Narrative   Goes by [employer].   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 Difficulty of Paying Living Expenses: Not on file  Food Insecurity:   . Worried About Charity fundraiser in the Last Year: Not on file  . Ran Out of Food in the Last Year: Not on file  Transportation Needs:   . Lack of Transportation (Medical): Not on file  . Lack of Transportation (Non-Medical): Not on file  Physical Activity:   . Days of Exercise per Week: Not on file  . Minutes of Exercise per Session: Not on file  Stress:   . Feeling of Stress : Not on file  Social Connections:   . Frequency of Communication with Friends and Family: Not on file  . Frequency of Social Gatherings with Friends and Family: Not on file  . Attends Religious Services: Not on file  .  Active Member of Clubs or Organizations: Not on file  . Attends Archivist Meetings: Not on file  . Marital Status: Not on file     Review of Systems: Constitutional: Negative for fever malaise or anorexia Cardiovascular: negative for chest pain Respiratory: negative for SOB or persistent cough Gastrointestinal: negative for abdominal pain  Objective  Vitals: BP 114/70   Pulse 85   Temp 98 F (36.7 C) (Temporal)   Resp 18   Ht 5\' 7"  (1.702 m)   Wt 127 lb  12.8 oz (58 kg)   SpO2 96%   BMI 20.02 kg/m  General: no acute distress, well appearing, no apparent distress, well groomed Psych:  Alert and oriented x 3,normal mood, behavior, speech, dress, and thought processes.     Commons side effects, risks, benefits, and alternatives for medications and treatment plan prescribed today were discussed, and the patient expressed understanding of the given instructions. Patient is instructed to call or message via MyChart if he/she has any questions or concerns regarding our treatment plan. No barriers to understanding were identified. We discussed Red Flag symptoms and signs in detail. Patient expressed understanding regarding what to do in case of urgent or emergency type symptoms.   Medication list was reconciled, printed and provided to the patient in AVS. Patient instructions and summary information was reviewed with the patient as documented in the AVS. This note was prepared with assistance of Dragon voice recognition software. Occasional wrong-word or sound-a-like substitutions may have occurred due to the inherent limitations of voice recognition software

## 2020-08-25 NOTE — Patient Instructions (Signed)
Please return in 8 weeks to recheck mood.   If you have any questions or concerns, please don't hesitate to send me a message via MyChart or call the office at [phone]. Thank you for visiting with Courtney Hood today! It's our pleasure caring for you.   Living With Depression Everyone experiences occasional disappointment, sadness, and loss in their lives. When you are feeling down, blue, or sad for at least 2 weeks in a row, it may mean that you have depression. Depression can affect your thoughts and feelings, relationships, daily activities, and physical health. It is caused by changes in the way your brain functions. If you receive a diagnosis of depression, your health care provider will tell you which type of depression you have and what treatment options are available to you. If you are living with depression, there are ways to help you recover from it and also ways to prevent it from coming back. How to cope with lifestyle changes Coping with stress     Stress is your body's reaction to life changes and events, both good and bad. Stressful situations may include:  Getting married.  The death of a spouse.  Losing a job.  Retiring.  Having a baby. Stress can last just a few hours or it can be ongoing. Stress can play a major role in depression, so it is important to learn both how to cope with stress and how to think about it differently. Talk with your health care provider or a counselor if you would like to learn more about stress reduction. He or she may suggest some stress reduction techniques, such as:  Music therapy. This can include creating music or listening to music. Choose music that you enjoy and that inspires you.  Mindfulness-based meditation. This kind of meditation can be done while sitting or walking. It involves being aware of your normal breaths, rather than trying to control your breathing.  Centering prayer. This is a kind of meditation that involves focusing on a  spiritual word or phrase. Choose a word, phrase, or sacred image that is meaningful to you and that brings you peace.  Deep breathing. To do this, expand your stomach and inhale slowly through your nose. Hold your breath for 3-5 seconds, then exhale slowly, allowing your stomach muscles to relax.  Muscle relaxation. This involves intentionally tensing muscles then relaxing them. Choose a stress reduction technique that fits your lifestyle and personality. Stress reduction techniques take time and practice to develop. Set aside 5-15 minutes a day to do them. Therapists can offer training in these techniques. The training may be covered by some insurance plans. Other things you can do to manage stress include:  Keeping a stress diary. This can help you learn what triggers your stress and ways to control your response.  Understanding what your limits are and saying no to requests or events that lead to a schedule that is too full.  Thinking about how you respond to certain situations. You may not be able to control everything, but you can control how you react.  Adding humor to your life by watching funny films or TV shows.  Making time for activities that help you relax and not feeling guilty about spending your time this way.  Medicines Your health care provider may suggest certain medicines if he or she feels that they will help improve your condition. Avoid using alcohol and other substances that may prevent your medicines from working properly (may interact). It is also important  to:  Talk with your pharmacist or health care provider about all the medicines that you take, their possible side effects, and what medicines are safe to take together.  Make it your goal to take part in all treatment decisions (shared decision-making). This includes giving input on the side effects of medicines. It is best if shared decision-making with your health care provider is part of your total treatment  plan. If your health care provider prescribes a medicine, you may not notice the full benefits of it for 4-8 weeks. Most people who are treated for depression need to be on medicine for at least 6-12 months after they feel better. If you are taking medicines as part of your treatment, do not stop taking medicines without first talking to your health care provider. You may need to have the medicine slowly decreased (tapered) over time to decrease the risk of harmful side effects. Relationships Your health care provider may suggest family therapy along with individual therapy and drug therapy. While there may not be family problems that are causing you to feel depressed, it is still important to make sure your family learns as much as they can about your mental health. Having your family's support can help make your treatment successful. How to recognize changes in your condition Everyone has a different response to treatment for depression. Recovery from major depression happens when you have not had signs of major depression for two months. This may mean that you will start to:  Have more interest in doing activities.  Feel less hopeless than you did 2 months ago.  Have more energy.  Overeat less often, or have better or improving appetite.  Have better concentration. Your health care provider will work with you to decide the next steps in your recovery. It is also important to recognize when your condition is getting worse. Watch for these signs:  Having fatigue or low energy.  Eating too much or too little.  Sleeping too much or too little.  Feeling restless, agitated, or hopeless.  Having trouble concentrating or making decisions.  Having unexplained physical complaints.  Feeling irritable, angry, or aggressive. Get help as soon as you or your family members notice these symptoms coming back. How to get support and help from others How to talk with friends and family members about  your condition  Talking to friends and family members about your condition can provide you with one way to get support and guidance. Reach out to trusted friends or family members, explain your symptoms to them, and let them know that you are working with a health care provider to treat your depression. Financial resources Not all insurance plans cover mental health care, so it is important to check with your insurance carrier. If paying for co-pays or counseling services is a problem, search for a local or county mental health care center. They may be able to offer public mental health care services at low or no cost when you are not able to see a private health care provider. If you are taking medicine for depression, you may be able to get the generic form, which may be less expensive. Some makers of prescription medicines also offer help to patients who cannot afford the medicines they need. Follow these instructions at home:   Get the right amount and quality of sleep.  Cut down on using caffeine, tobacco, alcohol, and other potentially harmful substances.  Try to exercise, such as walking or lifting small weights.  Take  over-the-counter and prescription medicines only as told by your health care provider.  Eat a healthy diet that includes plenty of vegetables, fruits, whole grains, low-fat dairy products, and lean protein. Do not eat a lot of foods that are high in solid fats, added sugars, or salt.  Keep all follow-up visits as told by your health care provider. This is important. Contact a health care provider if:  You stop taking your antidepressant medicines, and you have any of these symptoms: ? Nausea. ? Headache. ? Feeling lightheaded. ? Chills and body aches. ? Not being able to sleep (insomnia).  You or your friends and family think your depression is getting worse. Get help right away if:  You have thoughts of hurting yourself or others. If you ever feel like you may  hurt yourself or others, or have thoughts about taking your own life, get help right away. You can go to your nearest emergency department or call:  Your local emergency services (911 in the [place]  A suicide crisis helpline, such as the [place] at 937-504-0951. This is open 24-hours a day. Summary  If you are living with depression, there are ways to help you recover from it and also ways to prevent it from coming back.  Work with your health care team to create a management plan that includes counseling, stress management techniques, and healthy lifestyle habits. This information is not intended to replace advice given to you by your health care provider. Make sure you discuss any questions you have with your health care provider. Document Revised: 03/12/2019 Document Reviewed: 10/21/2016 Elsevier Patient Education  [place].

## 2020-09-18 ENCOUNTER — Other Ambulatory Visit: Payer: Self-pay | Admitting: Family Medicine

## 2020-09-18 DIAGNOSIS — F3342 Major depressive disorder, recurrent, in full remission: Secondary | ICD-10-CM

## 2020-10-20 ENCOUNTER — Other Ambulatory Visit: Payer: Self-pay

## 2020-10-20 ENCOUNTER — Ambulatory Visit (INDEPENDENT_AMBULATORY_CARE_PROVIDER_SITE_OTHER): Payer: Medicare HMO | Admitting: Family Medicine

## 2020-10-20 ENCOUNTER — Encounter: Payer: Self-pay | Admitting: Family Medicine

## 2020-10-20 VITALS — BP 112/60 | HR 76 | Temp 97.7°F | Ht 67.0 in | Wt 125.6 lb

## 2020-10-20 DIAGNOSIS — F331 Major depressive disorder, recurrent, moderate: Secondary | ICD-10-CM

## 2020-10-20 DIAGNOSIS — L299 Pruritus, unspecified: Secondary | ICD-10-CM

## 2020-10-20 MED ORDER — TRIAMCINOLONE ACETONIDE 0.1 % EX LOTN: 1.0000 "application " | TOPICAL_LOTION | Freq: Every evening | CUTANEOUS | 2 refills | Status: DC

## 2020-10-20 MED ORDER — BUPROPION HCL ER (XL) 150 MG PO TB24: 150.0000 mg | ORAL_TABLET | Freq: Every day | ORAL | 3 refills | Status: DC

## 2020-10-20 MED ORDER — SERTRALINE HCL 100 MG PO TABS: 200.0000 mg | ORAL_TABLET | Freq: Every day | ORAL | 3 refills | Status: DC

## 2020-10-20 NOTE — Progress Notes (Signed)
Subjective  CC:   Chief Complaint  Patient presents with  . Depression    "I am doing much better"   . Health Maintenance    aware she is over due for colonoscopy - states she will schedule    HPI: Courtney Hood is a 74 y.o. female who presents to the office today to address the problems listed above in the chief complaint, mood problems.  See last two notes; we've been adjusting up meds to treat depressive flare. Now doing better! More active, awakening and doing things again. Happier. About once weekly she will be a little unmotivated but mostly if doesn't have "a plan" for the day. C/o scalp itching for the last several months. ? Related to sertraline although we increased the dose, she has been on this meds for years. No new hair products. She does color her hair. No flaking.  Depression screen [facility] 2/9 08/25/2020 06/23/2020 01/20/2019  Decreased Interest 2 3 1   Down, Depressed, Hopeless 3 3 1   PHQ - 2 Score 5 6 2   Altered sleeping 2 2 1   Tired, decreased energy 2 3 0  Change in appetite 0 1 1  Feeling bad or failure about yourself  3 3 0  Trouble concentrating 0 0 0  Moving slowly or fidgety/restless 0 0 0  Suicidal thoughts 0 0 1  PHQ-9 Score 12 15 5   Difficult doing work/chores Very difficult Extremely dIfficult Somewhat difficult  Some recent data might be hidden   GAD 7 : Generalized Anxiety Score 08/25/2020 07/14/2020 06/23/2020  Nervous, Anxious, on Edge 2 3 2   Control/stop worrying 2 2 3   Worry too much - different things 2 2 2   Trouble relaxing 1 3 0  Restless 1 0 0  Easily annoyed or irritable 2 3 2   Afraid - awful might happen 1 2 0  Total GAD 7 Score 11 15 9   Anxiety Difficulty - Very difficult Very difficult     Assessment  1. Moderate episode of recurrent major depressive disorder ([place])   2. Scalp itch      Plan   Depression:  Improved. Continue same meds.   Treat inflammation of scalp with steroid lotion and monitor.   Reviewed concept of mood  problems caused by biochemical imbalance of neurotransmitters and rationale for treatment with medications and therapy.   Counseling given: pt was instructed to contact office, on-call physician or crisis Hotline if symptoms worsen significantly. If patient develops any suicidal or homicidal thoughts, she is directed to the ER immediately.   Follow up: as scheduled  No orders of the defined types were placed in this encounter.  Meds ordered this encounter  Medications  . buPROPion (WELLBUTRIN XL) 150 MG 24 hr tablet    Sig: Take 1 tablet (150 mg total) by mouth daily.    Dispense:  90 tablet    Refill:  3  . triamcinolone lotion (KENALOG) 0.1 %    Sig: Apply 1 application topically at bedtime. For 2 weeks, then as needed for scalp irritation    Dispense:  60 mL    Refill:  2  . sertraline (ZOLOFT) 100 MG tablet    Sig: Take 2 tablets (200 mg total) by mouth daily.    Dispense:  180 tablet    Refill:  3      I reviewed the patients updated PMH, FH, and SocHx.    Patient Active Problem List   Diagnosis Date Noted  . Moderate episode of recurrent  major depressive disorder ([place]) 03/16/2018  . B12 deficiency 08/15/2015  . Right lumbar radiculopathy 04/14/2015  . Primary hyperparathyroidism ([place]) 08/27/2010  . Mixed hyperlipidemia 07/25/2010  . Osteopenia of femoral neck, bilateral 07/25/2010   Current Meds  Medication Sig  . buPROPion (WELLBUTRIN XL) 150 MG 24 hr tablet Take 1 tablet (150 mg total) by mouth daily.  . Cholecalciferol 100 MCG (4000 UT) CAPS Take 1 capsule (4,000 Units total) by mouth daily. Take one tablet daily.  . clonazePAM (KLONOPIN) 0.5 MG tablet Take 1 tablet (0.5 mg total) by mouth daily as needed for anxiety.  . pravastatin (PRAVACHOL) 40 MG tablet TAKE 1 TABLET BY MOUTH EVERY DAY  . [DISCONTINUED] buPROPion (WELLBUTRIN XL) 150 MG 24 hr tablet TAKE 1 TABLET BY MOUTH EVERY DAY. please make appt FOR further refills  . [DISCONTINUED] Sertraline HCl 200 MG CAPS  Take by mouth.    Allergies: Patient is allergic to actonel [risedronate sodium]. Family history:  Patient family history includes Colon cancer (age of onset: 6) in her brother; Leukemia in her father. Social History   Socioeconomic History  . Marital status: Married    Spouse name: Not on file  . Number of children: Not on file  . Years of education: Not on file  . Highest education level: Not on file  Occupational History  . Not on file  Tobacco Use  . Smoking status: Former Smoker    Quit date: 12/06/1999    Years since quitting: 20.8  . Smokeless tobacco: Never Used  . Tobacco comment: Married, lives wih spouse. Moved to [place] area from [place] in spring 2011  Substance and Sexual Activity  . Alcohol use: Yes    Comment: social-maybe 1 glass wine twice a month.   . Drug use: No  . Sexual activity: Not on file  Other Topics Concern  . Not on file  Social History Narrative   Goes by [employer].   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 Difficulty of Paying Living Expenses: Not on file  Food Insecurity:   . Worried About Charity fundraiser in the Last Year: Not on file  . Ran Out of Food in the Last Year: Not on file  Transportation Needs:   . Lack of Transportation (Medical): Not on file  . Lack of Transportation (Non-Medical): Not on file  Physical Activity:   . Days of Exercise per Week: Not on file  . Minutes of Exercise per Session: Not on file  Stress:   . Feeling of Stress : Not on file  Social Connections:   . Frequency of Communication with Friends and Family: Not on file  . Frequency of Social Gatherings with Friends and Family: Not on file  . Attends Religious Services: Not on file  . Active Member of Clubs or Organizations: Not on file  . Attends Archivist Meetings: Not on file  . Marital  Status: Not on file     Review of Systems: Constitutional: Negative for fever malaise or anorexia Cardiovascular: negative for chest pain Respiratory: negative for SOB or persistent cough Gastrointestinal: negative for abdominal pain  Objective  Vitals: BP 112/60   Pulse 76   Temp 97.7 F (36.5 C) (Temporal)   Ht 5\' 7"  (1.702 m)   Wt 125 lb 9.6 oz (  57 kg)   SpO2 94%   BMI 19.67 kg/m  General: no acute distress, well appearing, no apparent distress, well groomed Psych:  Alert and oriented x 3,normal mood, behavior, speech, dress, and thought processes. Appears happy.  Scalp: diffuse erythema. Hair thinning. No flaking or rash    Commons side effects, risks, benefits, and alternatives for medications and treatment plan prescribed today were discussed, and the patient expressed understanding of the given instructions. Patient is instructed to call or message via MyChart if he/she has any questions or concerns regarding our treatment plan. No barriers to understanding were identified. We discussed Red Flag symptoms and signs in detail. Patient expressed understanding regarding what to do in case of urgent or emergency type symptoms.   Medication list was reconciled, printed and provided to the patient in AVS. Patient instructions and summary information was reviewed with the patient as documented in the AVS. This note was prepared with assistance of Dragon voice recognition software. Occasional wrong-word or sound-a-like substitutions may have occurred due to the inherent limitations of voice recognition software

## 2020-10-23 ENCOUNTER — Encounter: Payer: Self-pay | Admitting: Family Medicine

## 2020-10-31 ENCOUNTER — Other Ambulatory Visit: Payer: Self-pay

## 2020-10-31 DIAGNOSIS — L658 Other specified nonscarring hair loss: Secondary | ICD-10-CM

## 2020-10-31 DIAGNOSIS — R238 Other skin changes: Secondary | ICD-10-CM

## 2020-10-31 NOTE — Telephone Encounter (Signed)
Yes, send to derm please

## 2020-10-31 NOTE — Telephone Encounter (Signed)
Dx: female pattern baldness and scalp rash/itching

## 2020-11-07 DIAGNOSIS — L650 Telogen effluvium: Secondary | ICD-10-CM | POA: Diagnosis not present

## 2020-11-07 DIAGNOSIS — L258 Unspecified contact dermatitis due to other agents: Secondary | ICD-10-CM | POA: Diagnosis not present

## 2020-12-04 ENCOUNTER — Ambulatory Visit (INDEPENDENT_AMBULATORY_CARE_PROVIDER_SITE_OTHER): Payer: Medicare HMO | Admitting: Family Medicine

## 2020-12-04 ENCOUNTER — Other Ambulatory Visit: Payer: Self-pay

## 2020-12-04 ENCOUNTER — Encounter: Payer: Self-pay | Admitting: Family Medicine

## 2020-12-04 VITALS — BP 112/70 | HR 75 | Temp 97.3°F | Wt 123.6 lb

## 2020-12-04 DIAGNOSIS — E538 Deficiency of other specified B group vitamins: Secondary | ICD-10-CM | POA: Diagnosis not present

## 2020-12-04 DIAGNOSIS — Z800 Family history of malignant neoplasm of digestive organs: Secondary | ICD-10-CM | POA: Diagnosis not present

## 2020-12-04 DIAGNOSIS — Z780 Asymptomatic menopausal state: Secondary | ICD-10-CM

## 2020-12-04 DIAGNOSIS — E782 Mixed hyperlipidemia: Secondary | ICD-10-CM | POA: Diagnosis not present

## 2020-12-04 DIAGNOSIS — M85852 Other specified disorders of bone density and structure, left thigh: Secondary | ICD-10-CM

## 2020-12-04 DIAGNOSIS — E210 Primary hyperparathyroidism: Secondary | ICD-10-CM | POA: Diagnosis not present

## 2020-12-04 DIAGNOSIS — M85851 Other specified disorders of bone density and structure, right thigh: Secondary | ICD-10-CM

## 2020-12-04 DIAGNOSIS — L659 Nonscarring hair loss, unspecified: Secondary | ICD-10-CM

## 2020-12-04 DIAGNOSIS — Z0000 Encounter for general adult medical examination without abnormal findings: Secondary | ICD-10-CM

## 2020-12-04 DIAGNOSIS — F339 Major depressive disorder, recurrent, unspecified: Secondary | ICD-10-CM

## 2020-12-04 MED ORDER — SERTRALINE HCL 100 MG PO TABS: 100.0000 mg | ORAL_TABLET | Freq: Every day | ORAL | 3 refills | Status: DC

## 2020-12-04 NOTE — Progress Notes (Signed)
Subjective  Chief Complaint  Patient presents with  . Annual Exam    Non-fasting  . Alopecia    F/u with dermatologist this Wednesday, has noticed some improvement with new lotion prescribed by derm - still experiencing itching    HPI: Courtney Hood is a 75 y.o. female who presents to [facility] today for a Female Wellness Visit. She also has the concerns and/or needs as listed above in the chief complaint. These will be addressed in addition to the Health Maintenance Visit.   Wellness Visit: annual visit with health maintenance review and exam without Pap   HM: mammogram normal. Does self breast exams monthly. Due dexa. imms up to date. Feels well.  Chronic disease f/u and/or acute problem visit: (deemed necessary to be done in addition to the wellness visit):  Famhx: colon cancer in brother. GI recs q 5 year screens for her now. She has only had normals. But now overdue. She will schedule  Alopecia per derm now on high intensity steroid lotion w/o much change but less itching and less red; still with sig hair loss. Feels could be related to [place] increasing dose of sertraline. Time line follows hair loss pattern.   Depression remains well controlled now x several months on sertraline 200 and wellbutrin 150.   HLD on statin. Non fasting for recheck. Tolerates well  H/o hypercalcemia  On biotin  Vit b12 deficiency: takes oral supplements daily.   Assessment  1. Annual physical exam   2. Mixed hyperlipidemia   3. Major depression, recurrent, chronic ([place])   4. Primary hyperparathyroidism ([place])   5. B12 deficiency   6. Osteopenia of femoral neck, bilateral   7. Asymptomatic menopausal state   8. Family history of colon cancer   9. Alopecia      Plan  Female Wellness Visit:  Age appropriate Health Maintenance and Prevention measures were discussed with patient. Included topics are cancer screening recommendations, ways to keep healthy (see AVS)  including dietary and exercise recommendations, regular eye and dental care, use of seat belts, and avoidance of moderate alcohol use and tobacco use. Pt to schedule colonoscopy. dexa ordered  BMI: discussed patient's BMI and encouraged positive lifestyle modifications to help get to or maintain a target BMI.  HM needs and immunizations were addressed and ordered. See below for orders. See HM and immunization section for updates.  Routine labs and screening tests ordered including cmp, cbc and lipids where appropriate.  Discussed recommendations regarding Vit D and calcium supplementation (see AVS)  Chronic disease management visit and/or acute problem visit:  Recheck lipids on statin  Check calcium, pth and tsh. Check b12 levels on oral supplements  Depression: well controlled but possible ae from sertraline: decrease over 2-3 weeks back down to 100 daily. Monitor sxs and hair loss. Will discuss with derm as well.   Alopecia: per derm on steroid lotions  Follow up: Return in about 1 year (around 12/04/2021) for complete physical.  Orders Placed This Encounter  Procedures  . DG Bone Density  . CBC with Differential/Platelet  . Comprehensive metabolic panel  . Lipid panel  . TSH  . Vitamin B12  . PTH, intact (no Ca)   Meds ordered this encounter  Medications  . sertraline (ZOLOFT) 100 MG tablet    Sig: Take 1 tablet (100 mg total) by mouth daily.    Dispense:  180 tablet    Refill:  3      Lifestyle: Body  mass index is 19.36 kg/m. Wt Readings from Last 3 Encounters:  12/04/20 123 lb 9.6 oz (56.1 kg)  10/20/20 125 lb 9.6 oz (57 kg)  08/25/20 127 lb 12.8 oz (58 kg)     Patient Active Problem List   Diagnosis Date Noted  . Family history of colon cancer 12/04/2020  . Moderate episode of recurrent major depressive disorder (HCC) 03/16/2018  . Alopecia 01/30/2017  . B12 deficiency 08/15/2015    Hx same in [place], on monthly IM replacement in 2010, none since moved to [place] in  2011   . Right lumbar radiculopathy 04/14/2015    See 04/14/15   . Primary hyperparathyroidism (HCC) 08/27/2010  . Mixed hyperlipidemia 07/25/2010  . Osteopenia of femoral neck, bilateral 07/25/2010    dexa 2010 DEXA @ [facility] 10/2012: -2.0  DEXA @ [facility] 12/2014: -1.9 B fem    Health Maintenance  Topic Date Due  . DEXA SCAN  01/09/2019  . COLONOSCOPY (Pts 45-11yrs Insurance coverage will need to be confirmed)  04/02/2019  . TETANUS/TDAP  07/25/2020  . MAMMOGRAM  07/21/2021  . INFLUENZA VACCINE  Completed  . COVID-19 Vaccine  Completed  . Hepatitis C Screening  Completed  . PNA vac Low Risk Adult  Completed   Immunization History  Administered Date(s) Administered  . Fluad Quad(high Dose 65+) 10/18/2019, 08/25/2020  . Influenza, High Dose Seasonal PF 10/13/2013, 10/08/2016, 09/10/2017  . Influenza,inj,Quad PF,6+ Mos 08/15/2015, 01/20/2019  . PFIZER SARS-COV-2 Vaccination 01/10/2020, 02/04/2020, 09/19/2020  . Pneumococcal Conjugate-13 10/03/2015  . Pneumococcal Polysaccharide-23 02/07/2014  . Td 07/25/2010  . Zoster 10/18/2011   We updated and reviewed the patient's past history in detail and it is documented below. Allergies: Patient is allergic to actonel [risedronate sodium]. Past Medical History Patient  has a past medical history of Osteopenia of femoral neck, bilateral and Primary hyperparathyroidism (HCC). Past Surgical History Patient  has a past surgical history that includes Appendectomy and Abdominal hysterectomy. Family History: Patient family history includes Colon cancer (age of onset: 60) in her brother; Leukemia in her father. Social History:  Patient  reports that she quit smoking about 21 years ago. She has never used smokeless tobacco. She reports current alcohol use. She reports that she does not use drugs.  Review of Systems: Constitutional: negative for fever or malaise Ophthalmic: negative for photophobia, double vision or loss of vision Cardiovascular:  negative for chest pain, dyspnea on exertion, or new LE swelling Respiratory: negative for SOB or persistent cough Gastrointestinal: negative for abdominal pain, change in bowel habits or melena Genitourinary: negative for dysuria or gross hematuria, no abnormal uterine bleeding or disharge Musculoskeletal: negative for new gait disturbance or muscular weakness Integumentary: negative for new or persistent rashes, no breast lumps Neurological: negative for TIA or stroke symptoms Psychiatric: negative for SI or delusions Allergic/Immunologic: negative for hives  Patient Care Team    Relationship Specialty Notifications Start End  Willow Ora, MD PCP - General Family Medicine  10/18/19   Romero Belling, MD  Endocrinology  10/21/11   Rachael Fee, MD  Gastroenterology  04/06/14   Sedalia Muta, PT Physical Therapist Physical Therapy  11/01/19     Objective  Vitals: BP 112/70   Pulse 75   Temp (!) 97.3 F (36.3 C) (Temporal)   Wt 123 lb 9.6 oz (56.1 kg)   SpO2 97%   BMI 19.36 kg/m  General:  Well developed, well nourished, no acute distress  Psych:  Alert and orientedx3,normal mood and affect HEENT:  Normocephalic, atraumatic, non-icteric sclera,  supple neck without adenopathy, mass or thyromegaly Cardiovascular:  Normal S1, S2, RRR without gallop, rub or murmur Respiratory:  Good breath sounds bilaterally, CTAB with normal respiratory effort Gastrointestinal: normal bowel sounds, soft, non-tender, no noted masses. No HSM MSK: no deformities, contusions. Joints are without erythema or swelling.  Skin:  Warm, no rashes or suspicious lesions noted Neurologic:    Mental status is normal. CN 2-11 are normal. Gross motor and sensory exams are normal. Normal gait. No tremor Breast Exam: pt defers   Commons side effects, risks, benefits, and alternatives for medications and treatment plan prescribed today were discussed, and the patient expressed understanding of the given  instructions. Patient is instructed to call or message via MyChart if he/she has any questions or concerns regarding our treatment plan. No barriers to understanding were identified. We discussed Red Flag symptoms and signs in detail. Patient expressed understanding regarding what to do in case of urgent or emergency type symptoms.   Medication list was reconciled, printed and provided to the patient in AVS. Patient instructions and summary information was reviewed with the patient as documented in the AVS. This note was prepared with assistance of Dragon voice recognition software. Occasional wrong-word or sound-a-like substitutions may have occurred due to the inherent limitations of voice recognition software  This visit occurred during the SARS-CoV-2 public health emergency.  Safety protocols were in place, including screening questions prior to the visit, additional usage of staff PPE, and extensive cleaning of exam room while observing appropriate contact time as indicated for disinfecting solutions.

## 2020-12-04 NOTE — Patient Instructions (Signed)
Please return in 12 months for your annual complete physical; please come fasting. Can return sooner if needed for depression recheck.   I will release your lab results to you on your MyChart account with further instructions. Please reply with any questions.   Wean down on the zoloft as we discussed.   I have ordered a mammogram and/or bone density for you as we discussed today: []   Mammogram  [x]   Bone Density  Please call the office checked below to schedule your appointment: Your appointment will at the following location  [x]   [facility]      [address] [place],        [address] Wing         []   [facility]  [address] [place],  [facility]  Call and schedule your GI appointment to get set up for your colonoscopy as well.    If you have any questions or concerns, please don't hesitate to send me a message via MyChart or call the office at [phone]. Thank you for visiting with [facility] today! It's our pleasure caring for you.

## 2020-12-05 LAB — COMPREHENSIVE METABOLIC PANEL
ALT: 8 U/L (ref 0–35)
AST: 20 U/L (ref 0–37)
Albumin: 4.3 g/dL (ref 3.5–5.2)
Alkaline Phosphatase: 44 U/L (ref 39–117)
BUN: 17 mg/dL (ref 6–23)
CO2: 28 mEq/L (ref 19–32)
Calcium: 10.2 mg/dL (ref 8.4–10.5)
Chloride: 107 mEq/L (ref 96–112)
Creatinine, Ser: 0.91 mg/dL (ref 0.40–1.20)
GFR: 62.83 mL/min (ref >60.00)
Glucose, Bld: 81 mg/dL (ref 70–99)
Potassium: 4.2 mEq/L (ref 3.5–5.1)
Sodium: 140 mEq/L (ref 135–145)
Total Bilirubin: 0.4 mg/dL (ref 0.2–1.2)
Total Protein: 6.6 g/dL (ref 6.0–8.3)

## 2020-12-05 LAB — CBC WITH DIFFERENTIAL/PLATELET
Basophils Absolute: 0.1 10*3/uL (ref 0.0–0.1)
Basophils Relative: 1.6 % (ref 0.0–3.0)
Eosinophils Absolute: 0.1 10*3/uL (ref 0.0–0.7)
Eosinophils Relative: 2.5 % (ref 0.0–5.0)
HCT: 43.7 % (ref 36.0–46.0)
Hemoglobin: 14.5 g/dL (ref 12.0–15.0)
Lymphocytes Relative: 30 % (ref 12.0–46.0)
Lymphs Abs: 1.4 10*3/uL (ref 0.7–4.0)
MCHC: 33.2 g/dL (ref 30.0–36.0)
MCV: 88.9 fl (ref 78.0–100.0)
Monocytes Absolute: 0.5 10*3/uL (ref 0.1–1.0)
Monocytes Relative: 10.8 % (ref 3.0–12.0)
Neutro Abs: 2.7 10*3/uL (ref 1.4–7.7)
Neutrophils Relative %: 55.1 % (ref 43.0–77.0)
Platelets: 220 10*3/uL (ref 150.0–400.0)
RBC: 4.92 Mil/uL (ref 3.87–5.11)
RDW: 14.2 % (ref 11.5–15.5)
WBC: 4.8 10*3/uL (ref 4.0–10.5)

## 2020-12-05 LAB — LIPID PANEL
Cholesterol: 207 mg/dL — ABNORMAL HIGH (ref 0–200)
HDL: 69.5 mg/dL (ref >39.00)
LDL Cholesterol: 123 mg/dL — ABNORMAL HIGH (ref 0–99)
NonHDL: 137.34
Total CHOL/HDL Ratio: 3
Triglycerides: 71 mg/dL (ref 0.0–149.0)
VLDL: 14.2 mg/dL (ref 0.0–40.0)

## 2020-12-05 LAB — VITAMIN B12: Vitamin B-12: 1187 pg/mL — ABNORMAL HIGH (ref 211–911)

## 2020-12-05 LAB — TSH: TSH: 2.82 u[IU]/mL (ref 0.35–4.50)

## 2020-12-05 LAB — PARATHYROID HORMONE, INTACT (NO CA): PTH: 70 pg/mL — ABNORMAL HIGH (ref 14–64)

## 2020-12-06 DIAGNOSIS — L648 Other androgenic alopecia: Secondary | ICD-10-CM | POA: Diagnosis not present

## 2020-12-06 DIAGNOSIS — L738 Other specified follicular disorders: Secondary | ICD-10-CM | POA: Diagnosis not present

## 2020-12-06 DIAGNOSIS — L650 Telogen effluvium: Secondary | ICD-10-CM | POA: Diagnosis not present

## 2020-12-11 ENCOUNTER — Encounter: Payer: Self-pay | Admitting: Family Medicine

## 2021-01-08 ENCOUNTER — Ambulatory Visit
Admission: RE | Admit: 2021-01-08 | Discharge: 2021-01-08 | Disposition: A | Discharge status: Home / Self Care | Payer: Medicare HMO | Source: Ambulatory Visit | Attending: Family Medicine | Admitting: Family Medicine

## 2021-01-08 ENCOUNTER — Other Ambulatory Visit: Payer: Self-pay

## 2021-01-08 DIAGNOSIS — M85852 Other specified disorders of bone density and structure, left thigh: Secondary | ICD-10-CM

## 2021-01-08 DIAGNOSIS — Z0000 Encounter for general adult medical examination without abnormal findings: Secondary | ICD-10-CM

## 2021-01-08 DIAGNOSIS — Z780 Asymptomatic menopausal state: Secondary | ICD-10-CM

## 2021-01-08 DIAGNOSIS — M85851 Other specified disorders of bone density and structure, right thigh: Secondary | ICD-10-CM

## 2021-01-11 ENCOUNTER — Other Ambulatory Visit: Payer: Self-pay

## 2021-01-11 ENCOUNTER — Ambulatory Visit (INDEPENDENT_AMBULATORY_CARE_PROVIDER_SITE_OTHER)
Admission: RE | Admit: 2021-01-11 | Discharge: 2021-01-11 | Disposition: A | Discharge status: Home / Self Care | Payer: Medicare HMO | Source: Ambulatory Visit | Attending: Family Medicine | Admitting: Family Medicine

## 2021-01-11 DIAGNOSIS — M85852 Other specified disorders of bone density and structure, left thigh: Secondary | ICD-10-CM | POA: Diagnosis not present

## 2021-01-11 DIAGNOSIS — M85851 Other specified disorders of bone density and structure, right thigh: Secondary | ICD-10-CM | POA: Diagnosis not present

## 2021-01-12 ENCOUNTER — Other Ambulatory Visit: Payer: Self-pay | Admitting: Family Medicine

## 2021-01-12 DIAGNOSIS — E7800 Pure hypercholesterolemia, unspecified: Secondary | ICD-10-CM

## 2021-01-17 ENCOUNTER — Encounter: Payer: Self-pay | Admitting: Family Medicine

## 2021-01-26 ENCOUNTER — Ambulatory Visit (AMBULATORY_SURGERY_CENTER): Payer: Self-pay | Admitting: *Deleted

## 2021-01-26 ENCOUNTER — Other Ambulatory Visit: Payer: Self-pay

## 2021-01-26 VITALS — Ht 67.0 in | Wt 124.0 lb

## 2021-01-26 DIAGNOSIS — Z800 Family history of malignant neoplasm of digestive organs: Secondary | ICD-10-CM

## 2021-01-26 MED ORDER — SUPREP BOWEL PREP KIT 17.5-3.13-1.6 GM/177ML PO SOLN: 1.0000 | Freq: Once | ORAL | 0 refills | Status: AC

## 2021-01-26 NOTE — Progress Notes (Signed)
Pt made aware to call prior to procedure if diagnosed with covid and/or exposed to covid.  Understanding voiced.  Pt is aware that care partner will wait in the car during procedure; if they feel like they will be too hot or cold to wait in the car; they may wait in the 4 th floor lobby. Patient is aware to bring only one care partner. We want them to wear a mask (we do not have any that we can provide them), practice social distancing, and we will check their temperatures when they get here.  I did remind the patient that their care partner needs to stay in the parking lot the entire time and have a cell phone available, we will call them when the pt is ready for discharge. Patient will wear mask into building.   No trouble with anesthesia, denies being told they were difficult to intubate, or hx/fam hx of malignant hyperthermia per pt   No egg or soy allergy  No home oxygen use   No medications for weight loss taken  Pt denies constipation issues

## 2021-02-01 ENCOUNTER — Encounter: Payer: Self-pay | Admitting: Gastroenterology

## 2021-02-02 ENCOUNTER — Encounter: Payer: Self-pay | Admitting: Family Medicine

## 2021-02-09 ENCOUNTER — Encounter: Payer: Medicare HMO | Admitting: Gastroenterology

## 2021-02-14 ENCOUNTER — Telehealth: Payer: Self-pay | Admitting: Family Medicine

## 2021-02-14 NOTE — Telephone Encounter (Signed)
Left message for patient to call back and schedule Medicare Annual Wellness Visit (AWV) either virtually or in office. No detailed message left   Last AWV 08/15/15  please schedule at anytime with   This should be a 45 minute visit.

## 2021-02-21 NOTE — Telephone Encounter (Signed)
Pt refused MWV.

## 2021-03-28 ENCOUNTER — Ambulatory Visit (AMBULATORY_SURGERY_CENTER): Payer: Self-pay | Admitting: *Deleted

## 2021-03-28 ENCOUNTER — Other Ambulatory Visit: Payer: Self-pay

## 2021-03-28 VITALS — Ht 67.0 in | Wt 124.0 lb

## 2021-03-28 DIAGNOSIS — Z800 Family history of malignant neoplasm of digestive organs: Secondary | ICD-10-CM

## 2021-03-28 NOTE — Progress Notes (Signed)
Patient is here in-person for PV. Patient denies any allergies to eggs or soy. Patient denies any problems with anesthesia/sedation. Patient denies any oxygen use at home. Patient denies taking any diet/weight loss medications or blood thinners. Patient is not being treated for MRSA or C-diff. Patient is aware of our care-partner policy and [id number] safety protocol.  Patient denies any changes in medical hx since last GI PV. Patient is fully COVID-19 vaccinated, per patient.   Patient has suprep at home.

## 2021-04-13 ENCOUNTER — Encounter: Payer: Self-pay | Admitting: Gastroenterology

## 2021-04-13 ENCOUNTER — Other Ambulatory Visit: Payer: Self-pay

## 2021-04-13 ENCOUNTER — Ambulatory Visit (AMBULATORY_SURGERY_CENTER): Payer: Medicare HMO | Admitting: Gastroenterology

## 2021-04-13 VITALS — BP 119/62 | HR 71 | Temp 95.3°F | Resp 19 | Ht 67.0 in | Wt 124.0 lb

## 2021-04-13 DIAGNOSIS — K634 Enteroptosis: Secondary | ICD-10-CM | POA: Diagnosis not present

## 2021-04-13 DIAGNOSIS — Z800 Family history of malignant neoplasm of digestive organs: Secondary | ICD-10-CM | POA: Diagnosis not present

## 2021-04-13 DIAGNOSIS — D124 Benign neoplasm of descending colon: Secondary | ICD-10-CM

## 2021-04-13 DIAGNOSIS — Z1211 Encounter for screening for malignant neoplasm of colon: Secondary | ICD-10-CM

## 2021-04-13 DIAGNOSIS — K635 Polyp of colon: Secondary | ICD-10-CM | POA: Diagnosis not present

## 2021-04-13 DIAGNOSIS — F329 Major depressive disorder, single episode, unspecified: Secondary | ICD-10-CM | POA: Diagnosis not present

## 2021-04-13 DIAGNOSIS — E785 Hyperlipidemia, unspecified: Secondary | ICD-10-CM | POA: Diagnosis not present

## 2021-04-13 MED ORDER — SODIUM CHLORIDE 0.9 % IV SOLN: 500.0000 mL | Freq: Once | INTRAVENOUS | Status: DC

## 2021-04-13 NOTE — Progress Notes (Signed)
Called to room to assist during endoscopic procedure.  Patient ID and intended procedure confirmed with present staff. Received instructions for my participation in the procedure from the performing physician.  

## 2021-04-13 NOTE — Patient Instructions (Signed)
YOU HAD AN ENDOSCOPIC PROCEDURE TODAY AT THE [facility]:   Refer to the procedure report that was given to you for any specific questions about what was found during the examination.  If the procedure report does not answer your questions, please call your gastroenterologist to clarify.  If you requested that your care partner not be given the details of your procedure findings, then the procedure report has been included in a sealed envelope for you to review at your convenience later.  YOU SHOULD EXPECT: Some feelings of bloating in the abdomen. Passage of more gas than usual.  Walking can help get rid of the air that was put into your GI tract during the procedure and reduce the bloating. If you had a lower endoscopy (such as a colonoscopy or flexible sigmoidoscopy) you may notice spotting of blood in your stool or on the toilet paper. If you underwent a bowel prep for your procedure, you may not have a normal bowel movement for a few days.  Please Note:  You might notice some irritation and congestion in your nose or some drainage.  This is from the oxygen used during your procedure.  There is no need for concern and it should clear up in a day or so.  SYMPTOMS TO REPORT IMMEDIATELY:   Following lower endoscopy (colonoscopy or flexible sigmoidoscopy):  Excessive amounts of blood in the stool  Significant tenderness or worsening of abdominal pains  Swelling of the abdomen that is new, acute  Fever of 100F or higher   Following upper endoscopy (EGD)  Vomiting of blood or coffee ground material  New chest pain or pain under the shoulder blades  Painful or persistently difficult swallowing  New shortness of breath  Fever of 100F or higher  Black, tarry-looking stools  For urgent or emergent issues, a gastroenterologist can be reached at any hour by calling [phone]. Do not use MyChart messaging for urgent concerns.    DIET:  We do recommend a small meal at first, but  then you may proceed to your regular diet.  Drink plenty of fluids but you should avoid alcoholic beverages for 24 hours.  ACTIVITY:  You should plan to take it easy for the rest of today and you should NOT DRIVE or use heavy machinery until tomorrow (because of the sedation medicines used during the test).    FOLLOW UP: Our staff will call the number listed on your records 48-72 hours following your procedure to check on you and address any questions or concerns that you may have regarding the information given to you following your procedure. If we do not reach you, we will leave a message.  We will attempt to reach you two times.  During this call, we will ask if you have developed any symptoms of COVID 19. If you develop any symptoms (ie: fever, flu-like symptoms, shortness of breath, cough etc.) before then, please call [phone].  If you test positive for Covid 19 in the 2 weeks post procedure, please call and report this information to us.    If any biopsies were taken you will be contacted by phone or by letter within the next 1-3 weeks.  Please call us at [phone] if you have not heard about the biopsies in 3 weeks.    SIGNATURES/CONFIDENTIALITY: You and/or your care partner have signed paperwork which will be entered into your electronic medical record.  These signatures attest to the fact that that the information above on   your After Visit Summary has been reviewed and is understood.  Full responsibility of the confidentiality of this discharge information lies with you and/or your care-partner. 

## 2021-04-13 NOTE — Op Note (Signed)
[facility] Patient Name: Courtney Hood Procedure Date: 04/13/2021 10:46 AM MRN: [id number] Endoscopist: Milus Banister , MD Age: 75 Referring MD:  Date of Birth: 29-Oct-1946 Gender: Female Account #: [id number] Procedure:                Colonoscopy Indications:              Screening in patient at increased risk: Brother had                            colon cancer diagnosed in his 58s; Colonoscopy 2015                            found two subCM HPs Medicines:                Monitored Anesthesia Care Procedure:                Pre-Anesthesia Assessment:                           - Prior to the procedure, a History and Physical                            was performed, and patient medications and                            allergies were reviewed. The patient's tolerance of                            previous anesthesia was also reviewed. The risks                            and benefits of the procedure and the sedation                            options and risks were discussed with the patient.                            All questions were answered, and informed consent                            was obtained. Prior Anticoagulants: The patient has                            taken no previous anticoagulant or antiplatelet                            agents. ASA Grade Assessment: II - A patient with                            mild systemic disease. After reviewing the risks                            and benefits, the patient was deemed in  satisfactory condition to undergo the procedure.                           After obtaining informed consent, the colonoscope                            was passed under direct vision. Throughout the                            procedure, the patient's blood pressure, pulse, and                            oxygen saturations were monitored continuously. The                            Olympus PCF-H190DL (#[id number])  Colonoscope was                            introduced through the anus and advanced to the the                            cecum, identified by appendiceal orifice and                            ileocecal valve. The colonoscopy was performed                            without difficulty. The patient tolerated the                            procedure well. The quality of the bowel                            preparation was good. The ileocecal valve,                            appendiceal orifice, and rectum were photographed. Scope In: 10:52:08 AM Scope Out: 16:96:78 AM Scope Withdrawal Time: 0 hours 8 minutes 0 seconds  Total Procedure Duration: 0 hours 14 minutes 51 seconds  Findings:                 Two sessile polyps were found in the descending                            colon. The polyps were 3 to 6 mm in size. Both                            polyps were removed with cold snare, one polyp was                            retrieved.                           Multiple small and large-mouthed diverticula were  found in the left colon.                           The exam was otherwise without abnormality on                            direct and retroflexion views. Complications:            No immediate complications. Estimated blood loss:                            None. Estimated Blood Loss:     Estimated blood loss: none. Impression:               - Two 3 to 6 mm polyps in the descending colon,                            removed with a cold snare. Both polyps were                            removed, one was retrieved.                           - Diverticulosis in the left colon.                           - The examination was otherwise normal on direct                            and retroflexion views. Recommendation:           - Patient has a contact number available for                            emergencies. The signs and symptoms of potential                             delayed complications were discussed with the                            patient. Return to normal activities tomorrow.                            Written discharge instructions were provided to the                            patient.                           - Resume regular diet.                           - Await pathology results. Milus Banister, MD 04/13/2021 11:11:45 AM This report has been signed electronically.

## 2021-04-13 NOTE — Progress Notes (Signed)
Report to PACU, RN, vss, BBS= Clear.  

## 2021-04-17 ENCOUNTER — Telehealth: Payer: Self-pay | Admitting: *Deleted

## 2021-04-17 NOTE — Telephone Encounter (Signed)
  Follow up Call-  Call back number 04/13/2021  Post procedure Call Back phone  # [phone]  Permission to leave phone message Yes  Some recent data might be hidden     Patient questions:  Message left to call us if necessary.

## 2021-04-17 NOTE — Telephone Encounter (Signed)
Second attempt, left VM.  

## 2021-04-23 ENCOUNTER — Encounter: Payer: Self-pay | Admitting: Gastroenterology

## 2021-08-17 DIAGNOSIS — Z1231 Encounter for screening mammogram for malignant neoplasm of breast: Secondary | ICD-10-CM | POA: Diagnosis not present

## 2021-08-17 LAB — HM MAMMOGRAPHY

## 2021-08-21 ENCOUNTER — Encounter: Payer: Self-pay | Admitting: Family Medicine

## 2021-10-01 ENCOUNTER — Other Ambulatory Visit: Payer: Self-pay | Admitting: Family Medicine

## 2021-10-01 DIAGNOSIS — F331 Major depressive disorder, recurrent, moderate: Secondary | ICD-10-CM

## 2021-11-02 ENCOUNTER — Other Ambulatory Visit: Payer: Self-pay | Admitting: Family Medicine

## 2021-11-02 DIAGNOSIS — F331 Major depressive disorder, recurrent, moderate: Secondary | ICD-10-CM

## 2021-11-16 ENCOUNTER — Other Ambulatory Visit: Payer: Self-pay | Admitting: Family Medicine

## 2021-12-04 ENCOUNTER — Other Ambulatory Visit: Payer: Self-pay | Admitting: Family Medicine

## 2021-12-04 DIAGNOSIS — F331 Major depressive disorder, recurrent, moderate: Secondary | ICD-10-CM

## 2021-12-07 ENCOUNTER — Other Ambulatory Visit: Payer: Self-pay | Admitting: Family Medicine

## 2021-12-07 DIAGNOSIS — F331 Major depressive disorder, recurrent, moderate: Secondary | ICD-10-CM

## 2021-12-10 ENCOUNTER — Encounter: Payer: Self-pay | Admitting: Family Medicine

## 2021-12-10 ENCOUNTER — Ambulatory Visit (INDEPENDENT_AMBULATORY_CARE_PROVIDER_SITE_OTHER): Payer: Medicare HMO | Admitting: Family Medicine

## 2021-12-10 ENCOUNTER — Other Ambulatory Visit: Payer: Self-pay

## 2021-12-10 VITALS — BP 100/62 | HR 67 | Temp 97.2°F | Ht 67.0 in | Wt 118.8 lb

## 2021-12-10 DIAGNOSIS — Z800 Family history of malignant neoplasm of digestive organs: Secondary | ICD-10-CM

## 2021-12-10 DIAGNOSIS — E782 Mixed hyperlipidemia: Secondary | ICD-10-CM | POA: Diagnosis not present

## 2021-12-10 DIAGNOSIS — Z0000 Encounter for general adult medical examination without abnormal findings: Secondary | ICD-10-CM | POA: Diagnosis not present

## 2021-12-10 DIAGNOSIS — M810 Age-related osteoporosis without current pathological fracture: Secondary | ICD-10-CM

## 2021-12-10 DIAGNOSIS — F339 Major depressive disorder, recurrent, unspecified: Secondary | ICD-10-CM

## 2021-12-10 DIAGNOSIS — L659 Nonscarring hair loss, unspecified: Secondary | ICD-10-CM

## 2021-12-10 DIAGNOSIS — E210 Primary hyperparathyroidism: Secondary | ICD-10-CM | POA: Diagnosis not present

## 2021-12-10 DIAGNOSIS — E538 Deficiency of other specified B group vitamins: Secondary | ICD-10-CM | POA: Diagnosis not present

## 2021-12-10 DIAGNOSIS — E7800 Pure hypercholesterolemia, unspecified: Secondary | ICD-10-CM

## 2021-12-10 DIAGNOSIS — Z23 Encounter for immunization: Secondary | ICD-10-CM

## 2021-12-10 MED ORDER — SERTRALINE HCL 100 MG PO TABS: 200.0000 mg | ORAL_TABLET | Freq: Every day | ORAL | 3 refills | Status: DC

## 2021-12-10 MED ORDER — BUPROPION HCL ER (XL) 150 MG PO TB24: 150.0000 mg | ORAL_TABLET | Freq: Every day | ORAL | 3 refills | Status: DC

## 2021-12-10 MED ORDER — SHINGRIX 50 MCG/0.5ML IM SUSR: 0.5000 mL | Freq: Once | INTRAMUSCULAR | 0 refills | Status: AC

## 2021-12-10 MED ORDER — OYSTER SHELL CALCIUM/D3 500-5 MG-MCG PO TABS: 1.0000 | ORAL_TABLET | Freq: Two times a day (BID) | ORAL | Status: DC

## 2021-12-10 MED ORDER — PRAVASTATIN SODIUM 40 MG PO TABS: 40.0000 mg | ORAL_TABLET | Freq: Every evening | ORAL | 3 refills | Status: DC

## 2021-12-10 NOTE — Progress Notes (Signed)
Subjective  Chief Complaint  Patient presents with   Annual Exam    Fasting    HPI: Courtney Hood is a 76 y.o. female who presents to [facility] today for a Female Wellness Visit. She also has the concerns and/or needs as listed above in the chief complaint. These will be addressed in addition to the Health Maintenance Visit.   Wellness Visit: annual visit with health maintenance review and exam without Pap  HM: current. Does self breast exams. Feels well.  Eligible for Shingrix and flu vaccination and fourth COVID booster Chronic disease f/u and/or acute problem visit: (deemed necessary to be done in addition to the wellness visit): Depression: Remains well controlled.  On sertraline 200 mg daily along with Wellbutrin 150 mg daily.  Has been on this regimen for over a year.  Feels that is working well.  No symptoms of anxiety or depression.  Her activity level has improved.  No adverse effects.  She is due for refills Alopecia: Followed by dermatology.  Does not think it is related to the sertraline. Vitamin B12 deficiency on oral supplements.  Due for recheck. Hyperlipidemia on pravastatin 40 nightly.  Fasting for recheck.  Tolerating medication well.  No adverse effects Osteoporosis by recent bone density.  Reviewed recent scan with her.  Lowest T equals -2.5.  She reports that she has not been taking calcium or vitamin D over the last year and has not done any exercise outside of walking.  Decades ago she was treated with biphosphonate's including Fosamax, Actonel and Boniva.  She adverse effects and allergies related to them.  Never been on HRT.  Menopause at 40 due to hysterectomy.  No history of fracture.  She prefers to not start medication at this time. History of primary hyperparathyroidism: No symptoms of hypercalcemia.  Due for recheck  Assessment  1. Annual physical exam   2. Chronic recurrent major depressive disorder ([place])   3. Alopecia   4.  B12 deficiency   5. Mixed hyperlipidemia   6. Family history of colon cancer   7. Age-related osteoporosis without current pathological fracture   8. Primary hyperparathyroidism ([place])   9. Pure hypercholesterolemia      Plan  Female Wellness Visit: Age appropriate Health Maintenance and Prevention measures were discussed with patient. Included topics are cancer screening recommendations, ways to keep healthy (see AVS) including dietary and exercise recommendations, regular eye and dental care, use of seat belts, and avoidance of moderate alcohol use and tobacco use.  Screens are current BMI: discussed patient's BMI and encouraged positive lifestyle modifications to help get to or maintain a target BMI. HM needs and immunizations were addressed and ordered. See below for orders. See HM and immunization section for updates.  Flu vaccine today.  Patient will get COVID and Shingrix vaccinations at pharmacy education given Routine labs and screening tests ordered including cmp, cbc and lipids where appropriate. Discussed recommendations regarding Vit D and calcium supplementation (see AVS)  Chronic disease management visit and/or acute problem visit: Major depression: Remains well controlled for the last 12 months.  Continue sertraline and Wellbutrin, 200 and 150 daily respectively Osteoporosis: Educated on prevention and treatment options.  Patient defers medications at this time.  Will take calcium and vitamin D and start strength training.  We will reassess in 1 years time.  Recommend Prolia or Evista at that time if worsening.  Patient agrees with care plan.  See after visit summary. Recheck lipids  on statin.  Has history of familial hyperlipidemia Recheck vitamin B12 and parathyroid levels Alopecia per dermatology.  Stable by patient report Follow up:  12 months for complete physical Orders Placed This Encounter  Procedures   CBC with Differential/Platelet   Comprehensive metabolic panel    Lipid panel   Parathyroid hormone, intact (no Ca)   TSH   Vitamin B12   Meds ordered this encounter  Medications   buPROPion (WELLBUTRIN XL) 150 MG 24 hr tablet    Sig: Take 1 tablet (150 mg total) by mouth daily.    Dispense:  90 tablet    Refill:  3    Please disregard the prior order for # 30 on Dec 07 2021   calcium-vitamin D (OSCAL WITH D) 500-5 MG-MCG tablet    Sig: Take 1 tablet by mouth 2 (two) times daily.   pravastatin (PRAVACHOL) 40 MG tablet    Sig: Take 1 tablet (40 mg total) by mouth at bedtime.    Dispense:  90 tablet    Refill:  3   sertraline (ZOLOFT) 100 MG tablet    Sig: Take 2 tablets (200 mg total) by mouth daily.    Dispense:  180 tablet    Refill:  3   Zoster Vaccine Adjuvanted [employer]) injection    Sig: Inject 0.5 mLs into the muscle once for 1 dose. Please give 2nd dose 2-6 months after first dose    Dispense:  2 each    Refill:  0      Body mass index is 18.61 kg/m. Wt Readings from Last 3 Encounters:  12/10/21 118 lb 12.8 oz (53.9 kg)  04/13/21 124 lb (56.2 kg)  03/28/21 124 lb (56.2 kg)     Patient Active Problem List   Diagnosis Date Noted   Family history of colon cancer 12/04/2020   Moderate episode of recurrent major depressive disorder ([place]) 03/16/2018   Alopecia 01/30/2017   B12 deficiency 08/15/2015    Hx same in [place], on monthly IM replacement in 2010, none since moved to [place] in 2011    Right lumbar radiculopathy 04/14/2015    See 04/14/15    Primary hyperparathyroidism ([place]) 08/27/2010   Mixed hyperlipidemia 07/25/2010   Osteoporosis 07/25/2010    dexa 2010 DEXA @ [facility] 10/2012: -2.0  DEXA @ [facility] 12/2014: -1.9 B fem DEXA 01/2021: t = -2.5 left hip. Can offer therapy. Pt will strength train and get back on vit D / calcium. Will recheck 01/2023 and restart meds then. Has failed biphosphonates due to rash and stomach upset.     Health Maintenance  Topic Date Due   Zoster Vaccines- Shingrix (1 of 2) Never done   COVID-19 Vaccine (4 -  Booster for Pfizer series) 11/14/2020   INFLUENZA VACCINE  07/02/2021   MAMMOGRAM  08/17/2022   DEXA SCAN  01/11/2023   COLONOSCOPY (Pts 45-50yrs Insurance coverage will need to be confirmed)  04/13/2026   Pneumonia Vaccine 48+ Years old  Completed   Hepatitis C Screening  Completed   HPV VACCINES  Aged Out   TETANUS/TDAP  Discontinued   Immunization History  Administered Date(s) Administered   Fluad Quad(high Dose 65+) 10/18/2019, 08/25/2020   Influenza, High Dose Seasonal PF 10/13/2013, 10/08/2016, 09/10/2017   Influenza,inj,Quad PF,6+ Mos 08/15/2015, 01/20/2019   PFIZER(Purple Top)SARS-COV-2 Vaccination 01/10/2020, 02/04/2020, 09/19/2020   Pneumococcal Conjugate-13 10/03/2015   Pneumococcal Polysaccharide-23 02/07/2014   Td 07/25/2010   Zoster, Live 10/18/2011   We updated and reviewed the patient's past history  in detail and it is documented below. Allergies: Patient is allergic to actonel [risedronate sodium]. Past Medical History Patient  has a past medical history of Depression, Hyperlipidemia, Osteopenia of femoral neck, bilateral, and Primary hyperparathyroidism ([place]). Past Surgical History Patient  has a past surgical history that includes Appendectomy; Abdominal hysterectomy; and Colonoscopy. Family History: Patient family history includes Colon cancer (age of onset: 62) in her brother; Leukemia in her father. Social History:  Patient  reports that she quit smoking about 22 years ago. Her smoking use included cigarettes. She has never used smokeless tobacco. She reports current alcohol use. She reports that she does not use drugs.  Review of Systems: Constitutional: negative for fever or malaise Ophthalmic: negative for photophobia, double vision or loss of vision Cardiovascular: negative for chest pain, dyspnea on exertion, or new LE swelling Respiratory: negative for SOB or persistent cough Gastrointestinal: negative for abdominal pain, change in bowel habits or  melena Genitourinary: negative for dysuria or gross hematuria, no abnormal uterine bleeding or disharge Musculoskeletal: negative for new gait disturbance or muscular weakness Integumentary: negative for new or persistent rashes, no breast lumps Neurological: negative for TIA or stroke symptoms Psychiatric: negative for SI or delusions Allergic/Immunologic: negative for hives  Patient Care Team    Relationship Specialty Notifications Start End  Leamon Arnt, MD PCP - General Family Medicine  10/18/19   Renato Shin, MD  Endocrinology  10/21/11   Milus Banister, MD  Gastroenterology  04/06/14   Lyndee Hensen, PT Physical Therapist Physical Therapy  11/01/19   Allyn Kenner, MD  Dermatology  12/04/20     Objective  Vitals: BP 100/62    Pulse 67    Temp (!) 97.2 F (36.2 C) (Temporal)    Ht 5\' 7"  (1.702 m)    Wt 118 lb 12.8 oz (53.9 kg)    SpO2 97%    BMI 18.61 kg/m  General:  Well developed, well nourished, no acute distress, thin Psych:  Alert and orientedx3,normal mood and affect, appears happy HEENT:  Normocephalic, atraumatic, non-icteric sclera,  supple neck without adenopathy, mass or thyromegaly Cardiovascular:  Normal S1, S2, RRR without gallop, rub or murmur Respiratory:  Good breath sounds bilaterally, CTAB with normal respiratory effort Gastrointestinal: normal bowel sounds, soft, non-tender, no noted masses. No HSM MSK: no deformities, contusions. Joints are without erythema or swelling.  Skin:  Warm, no rashes or suspicious lesions noted Neurologic:    Mental status is normal. CN 2-11 are normal. Gross motor and sensory exams are normal. Normal gait. No tremor   Commons side effects, risks, benefits, and alternatives for medications and treatment plan prescribed today were discussed, and the patient expressed understanding of the given instructions. Patient is instructed to call or message via MyChart if he/she has any questions or concerns regarding our treatment plan. No  barriers to understanding were identified. We discussed Red Flag symptoms and signs in detail. Patient expressed understanding regarding what to do in case of urgent or emergency type symptoms.  Medication list was reconciled, printed and provided to the patient in AVS. Patient instructions and summary information was reviewed with the patient as documented in the AVS. This note was prepared with assistance of Dragon voice recognition software. Occasional wrong-word or sound-a-like substitutions may have occurred due to the inherent limitations of voice recognition software  This visit occurred during the SARS-CoV-2 public health emergency.  Safety protocols were in place, including screening questions prior to the visit, additional usage of staff PPE,  and extensive cleaning of exam room while observing appropriate contact time as indicated for disinfecting solutions.

## 2021-12-10 NOTE — Patient Instructions (Addendum)
Please return in 12 months for your annual complete physical; please come fasting.   I will release your lab results to you on your MyChart account with further instructions. Please reply with any questions.    Today you were given your flu shot.  Please take the prescription for Shingrix to the pharmacy so they may administer the vaccinations. Your insurance will then cover the injections.    If you have any questions or concerns, please don't hesitate to send me a message via MyChart or call the office at [phone]. Thank you for visiting with [place] today! It's our pleasure caring for you.  Calcium Intake Recommendations You can take Caltrate Plus twice a day or get it through your diet or other OTC supplements (Viactiv, OsCal etc)  Calcium is a mineral that affects many functions in the body, including: Blood clotting. Blood vessel function. Nerve impulse conduction. Hormone secretion. Muscle contraction. Bone and teeth functions.  Most of your body's calcium supply is stored in your bones and teeth. When your calcium stores are low, you may be at risk for low bone mass, bone loss, and bone fractures. Consuming enough calcium helps to grow healthy bones and teeth and to prevent breakdown over time. It is very important that you get enough calcium if you are: A child undergoing rapid growth. An adolescent girl. A pre- or post-menopausal woman. A woman whose menstrual cycle has stopped due to anorexia nervosa or regular intense exercise. An individual with lactose intolerance or a milk allergy. A vegetarian.  What is my plan? Try to consume the recommended amount of calcium daily based on your age. Depending on your overall health, your health care provider may recommend increased calcium intake. General daily calcium intake recommendations by age are: Birth to 6 months: 200 mg. Infants 7 to 12 months: 260 mg. Children 1 to 3 years: 700 mg. Children 4 to 8 years: 1,000  mg. Children 9 to 13 years: 1,300 mg. Teens 14 to 18 years: 1,300 mg. Adults 19 to 50 years: 1,000 mg. Adult women 51 to 70 years: 1,200 mg. Adult men 51 to 70 years: 1,000 mg. Adults 71 years and older: 1,200 mg. Pregnant and breastfeeding teens: 1,300 mg. Pregnant and breastfeeding adults: 1,000 mg.  What do I need to know about calcium intake? In order for the body to absorb calcium, it needs vitamin D. You can get vitamin D through (we recommend getting [phone] units of Vitamin D daily) Direct exposure of the skin to sunlight. Foods, such as egg yolks, liver, saltwater fish, and fortified milk. Supplements. Consuming too much calcium may cause: Constipation. Decreased absorption of iron and zinc. Kidney stones. Calcium supplements may interact with certain medicines. Check with your health care provider before starting any calcium supplements. Try to get most of your calcium from food. What foods can I eat? Grains  Fortified oatmeal. Fortified ready-to-eat cereals. Fortified frozen waffles. Vegetables Turnip greens. Broccoli. Fruits Fortified orange juice. Meats and Other Protein Sources Canned sardines with bones. Canned salmon with bones. Soy beans. Tofu. Baked beans. Almonds. [place] nuts. Sunflower seeds. Dairy Milk. Yogurt. Cheese. Cottage cheese. Beverages Fortified soy milk. Fortified rice milk. Sweets/Desserts Pudding. Ice Cream. Milkshakes. Blackstrap molasses. The items listed above may not be a complete list of recommended foods or beverages. Contact your dietitian for more options. What foods can affect my calcium intake? It may be more difficult for your body to use calcium or calcium may leave your body more quickly if you consume  large amounts of: Sodium. Protein. Caffeine. Alcohol.  This information is not intended to replace advice given to you by your health care provider. Make sure you discuss any questions you have with your health care  provider. Document Released: 07/02/2004 Document Revised: 06/07/2016 Document Reviewed: 04/26/2014 Elsevier Interactive Patient Education  2018 [employer].  Preventing Osteoporosis, Adult Osteoporosis is a condition that causes the bones to lose density. This means that the bones become thinner, and the normal spaces in bone tissue become larger. Low bone density can make the bones weak and cause them to break more easily. Osteoporosis cannot always be prevented, but you can take steps to lower your risk of developing this condition. How can this condition affect me? If you develop osteoporosis, you will be more likely to break bones in your wrist, spine, or hip. Even a minor accident or injury can be enough to break weak bones. The bones will also be slower to heal. Osteoporosis can cause other problems as well, such as a stooped posture or trouble with movement. Osteoporosis can occur with aging. As you get older, you may lose bone tissue more quickly, or it may be replaced more slowly. Osteoporosis is more likely to develop if you have poor nutrition or do not get enough calcium or vitamin D. Other lifestyle factors can also play a role. By eating a well-balanced diet and making lifestyle changes, you can help keep your bones strong and healthy, lowering your chances of developing osteoporosis. What can increase my risk? The following factors may make you more likely to develop osteoporosis: Having a family history of the condition. Having poor nutrition or not getting enough calcium or vitamin D. Using certain medicines, such as steroid medicines or anti-seizure medicines. Being any of the following: 13 years of age or older. Female. A woman who has gone through menopause (is postmenopausal). A person who is of European or Asian descent. Using products that contain nicotine or tobacco, such as cigarettes, e-cigarettes, and chewing tobacco. Not being physically active (being  sedentary). Having a small body frame. What actions can I take to prevent this? Get enough calcium  Make sure you get enough calcium every day. Calcium is the most important mineral for bone health. Most people can get enough calcium from their diet, but supplements may be recommended for people who are at risk for osteoporosis. Follow these guidelines: If you are age 53 or younger, aim to get 1,000 milligrams (mg) of calcium every day. If you are older than age 68, aim to get 1,200 mg of calcium every day. Good sources of calcium include: Dairy products, such as low-fat or nonfat milk, cheese, and yogurt. Dark green leafy vegetables, such as bok choy and broccoli. Foods that have had calcium added to them (calcium-fortified foods), such as orange juice, cereal, bread, soy beverages, and tofu products. Nuts, such as almonds. Check nutrition labels to see how much calcium is in a food or drink. Get enough vitamin D Try to get enough vitamin D every day. Vitamin D is the most essential vitamin for bone health. It helps the body absorb calcium. Follow these guidelines for how much vitamin D to get from food: If you are age 30 or younger, aim to get at least 600 international units (IU) every day. Your health care provider may suggest more. If you are older than age 11, aim to get at least 800 international units every day. Your health care provider may suggest more. Good sources of vitamin  D in your diet include: Egg yolks. Oily fish, such as salmon, sardines, and tuna. Milk and cereal fortified with vitamin D. Your body also makes vitamin D when you are out in the sun. Exposing the bare skin on your face, arms, legs, or back to the sun for no more than 30 minutes a day, 2 times a week is more than enough. Beyond that, make sure you use sunblock to protect your skin from sunburn, which increases your risk for skin cancer. Exercise  Stay active and get exercise every day. Ask your health care  provider what types of exercise are best for you. Weight-bearing and strength-building activities are important for building and maintaining healthy bones. Some examples of these types of activities include: Walking and hiking. Jogging and running. Dancing. Gym exercises and lifting weights. Tennis and racquetball. Climbing stairs. Tai chi. Make other lifestyle changes Do not use any products that contain nicotine or tobacco, such as cigarettes, e-cigarettes, and chewing tobacco. If you need help quitting, ask your health care provider. Lose weight if you are overweight. If you drink alcohol: Limit how much you use to: 0-1 drink a day for women who are not pregnant. 0-2 drinks a day for men. Be aware of how much alcohol is in your drink. In the [place] one drink equals one 12 oz bottle of beer (355 mL), one 5 oz glass of wine (148 mL), or one 1 oz glass of hard liquor (44 mL). Where to find support If you need help making changes to prevent osteoporosis, talk with your health care provider. You can ask for a referral to a dietitian and a physical therapist. Where to find more information Learn more about osteoporosis from: NIH Osteoporosis and Related Tensas: www.bones.SouthExposed.es [place] Office on [employer] Health: [place]s.tn [place]: EquipmentWeekly.com.ee Summary Osteoporosis is a condition that causes weak bones that are more likely to break. Eat a healthy diet, making sure you get enough calcium and vitamin D, and stay active by getting regular exercise to help prevent osteoporosis. Other ways to reduce your risk of osteoporosis include maintaining a healthy weight and avoiding alcohol and products that contain nicotine or tobacco. This information is not intended to replace advice given to you by your health care provider. Make sure you discuss any questions you have with your health care provider. Document Revised: 05/04/2020 Document  Reviewed: 05/04/2020 Elsevier Patient Education  [place].

## 2021-12-10 NOTE — Addendum Note (Signed)
Addended by: Verne Grain on: 12/10/2021 03:48 PM   Modules accepted: Orders

## 2021-12-11 ENCOUNTER — Telehealth: Payer: Self-pay | Admitting: Family Medicine

## 2021-12-11 LAB — LIPID PANEL
Cholesterol: 234 mg/dL — ABNORMAL HIGH (ref 0–200)
HDL: 74 mg/dL (ref >39.00)
LDL Cholesterol: 140 mg/dL — ABNORMAL HIGH (ref 0–99)
NonHDL: 159.73
Total CHOL/HDL Ratio: 3
Triglycerides: 101 mg/dL (ref 0.0–149.0)
VLDL: 20.2 mg/dL (ref 0.0–40.0)

## 2021-12-11 LAB — CBC WITH DIFFERENTIAL/PLATELET
Basophils Absolute: 0.1 10*3/uL (ref 0.0–0.1)
Basophils Relative: 1.3 % (ref 0.0–3.0)
Eosinophils Absolute: 0.1 10*3/uL (ref 0.0–0.7)
Eosinophils Relative: 1.5 % (ref 0.0–5.0)
HCT: 46.9 % — ABNORMAL HIGH (ref 36.0–46.0)
Hemoglobin: 15.2 g/dL — ABNORMAL HIGH (ref 12.0–15.0)
Lymphocytes Relative: 26.3 % (ref 12.0–46.0)
Lymphs Abs: 1.4 10*3/uL (ref 0.7–4.0)
MCHC: 32.4 g/dL (ref 30.0–36.0)
MCV: 90.4 fl (ref 78.0–100.0)
Monocytes Absolute: 0.4 10*3/uL (ref 0.1–1.0)
Monocytes Relative: 6.8 % (ref 3.0–12.0)
Neutro Abs: 3.4 10*3/uL (ref 1.4–7.7)
Neutrophils Relative %: 64.1 % (ref 43.0–77.0)
Platelets: 188 10*3/uL (ref 150.0–400.0)
RBC: 5.2 Mil/uL — ABNORMAL HIGH (ref 3.87–5.11)
RDW: 13.6 % (ref 11.5–15.5)
WBC: 5.3 10*3/uL (ref 4.0–10.5)

## 2021-12-11 LAB — COMPREHENSIVE METABOLIC PANEL
ALT: 7 U/L (ref 0–35)
AST: 17 U/L (ref 0–37)
Albumin: 4.3 g/dL (ref 3.5–5.2)
Alkaline Phosphatase: 51 U/L (ref 39–117)
BUN: 18 mg/dL (ref 6–23)
CO2: 29 mEq/L (ref 19–32)
Calcium: 10.4 mg/dL (ref 8.4–10.5)
Chloride: 105 mEq/L (ref 96–112)
Creatinine, Ser: 0.89 mg/dL (ref 0.40–1.20)
GFR: 64.06 mL/min (ref >60.00)
Glucose, Bld: 84 mg/dL (ref 70–99)
Potassium: 4.3 mEq/L (ref 3.5–5.1)
Sodium: 140 mEq/L (ref 135–145)
Total Bilirubin: 0.5 mg/dL (ref 0.2–1.2)
Total Protein: 6.5 g/dL (ref 6.0–8.3)

## 2021-12-11 LAB — PARATHYROID HORMONE, INTACT (NO CA): PTH: 99 pg/mL — ABNORMAL HIGH (ref 16–77)

## 2021-12-11 LAB — VITAMIN B12: Vitamin B-12: 616 pg/mL (ref 211–911)

## 2021-12-11 LAB — TSH: TSH: 3.21 u[IU]/mL (ref 0.35–5.50)

## 2021-12-11 NOTE — Telephone Encounter (Signed)
Pt was seen yesterday on 1/09 and tested Positive today for Covid. She would like to speak with someone about either having something called in or what she can do. Thank you

## 2021-12-11 NOTE — Telephone Encounter (Signed)
Patient scheduled.

## 2021-12-11 NOTE — Telephone Encounter (Signed)
Offer patient VV if she'd like.

## 2021-12-12 ENCOUNTER — Encounter: Payer: Self-pay | Admitting: Family Medicine

## 2021-12-12 ENCOUNTER — Other Ambulatory Visit: Payer: Self-pay

## 2021-12-12 ENCOUNTER — Telehealth (INDEPENDENT_AMBULATORY_CARE_PROVIDER_SITE_OTHER): Payer: Medicare HMO | Admitting: Family Medicine

## 2021-12-12 DIAGNOSIS — U071 COVID-19: Secondary | ICD-10-CM

## 2021-12-12 MED ORDER — NIRMATRELVIR/RITONAVIR (PAXLOVID)TABLET: 3.0000 | ORAL_TABLET | Freq: Two times a day (BID) | ORAL | 0 refills | Status: AC

## 2021-12-12 MED ORDER — BENZONATATE 100 MG PO CAPS: 100.0000 mg | ORAL_CAPSULE | Freq: Three times a day (TID) | ORAL | 0 refills | Status: DC | PRN

## 2021-12-12 NOTE — Progress Notes (Signed)
MyChart Video Visit    Virtual Visit via Video Note   This visit type was conducted due to national recommendations for restrictions regarding the COVID-19 Pandemic (e.g. social distancing) in an effort to limit this patient's exposure and mitigate transmission in our community. This patient is at least at moderate risk for complications without adequate follow up. This format is felt to be most appropriate for this patient at this time. Physical exam was limited by quality of the video and audio technology used for the visit. CMA was able to get the patient set up on a video visit.  Patient location: Home. Patient and provider in visit Provider location: Office  I discussed the limitations of evaluation and management by telemedicine and the availability of in person appointments. The patient expressed understanding and agreed to proceed.  Visit Date: 12/12/2021  Today's healthcare provider: Wellington Hampshire, MD     Subjective:    Patient ID: Courtney Hood, female    DOB: 1946/03/27, 77 y.o.   MRN: [id number]  Chief Complaint  Patient presents with   Covid Positive    Tested positive on 12/11/21 Sx started Monday night Has been taking Tylenol    Headache   Sore Throat   Cough   Nasal Congestion    HPI-covid Chief complaint: covid-tested + 12/11/21 Symptom onset: 12/10/21 Pertinent positives: ha, sore throat,cough, congestion, myalgias Pertinent negatives: no f/c/n/v/d/no sob Treatments tried: taking tylenol Vaccine status: has not received bivalent vaccine Sick exposure: husb as well   Past Medical History:  Diagnosis Date   Depression    Hyperlipidemia    Osteopenia of femoral neck, bilateral    Primary hyperparathyroidism ([place])     Past Surgical History:  Procedure Laterality Date   ABDOMINAL HYSTERECTOMY     APPENDECTOMY     COLONOSCOPY      Outpatient Medications Prior to Visit  Medication Sig Dispense Refill   buPROPion (WELLBUTRIN XL) 150 MG 24 hr  tablet Take 1 tablet (150 mg total) by mouth daily. 90 tablet 3   calcium-vitamin D (OSCAL WITH D) 500-5 MG-MCG tablet Take 1 tablet by mouth 2 (two) times daily.     Cyanocobalamin (VITAMIN B-12 PO) Take by mouth daily.     pravastatin (PRAVACHOL) 40 MG tablet Take 1 tablet (40 mg total) by mouth at bedtime. 90 tablet 3   sertraline (ZOLOFT) 100 MG tablet Take 2 tablets (200 mg total) by mouth daily. 180 tablet 3   No facility-administered medications prior to visit.    Allergies  Allergen Reactions   Actonel [Risedronate Sodium] Rash        Objective:     Physical Exam Vitals and nursing note reviewed.  Constitutional:      General:  is not in acute distress.    Appearance: Normal appearance.  HENT:     Head: Normocephalic.  Pulmonary:     Effort: No respiratory distress.   Skin:    General: Skin is dry.     Coloration: Skin is not pale.  Neurological:     Mental Status: Pt is alert and oriented to person, place, and time.  Psychiatric:        Mood and Affect: Mood normal.   There were no vitals taken for this visit.  Wt Readings from Last 3 Encounters:  12/10/21 118 lb 12.8 oz (53.9 kg)  04/13/21 124 lb (56.2 kg)  03/28/21 124 lb (56.2 kg)       Assessment & Plan:  Problem List Items Addressed This Visit   None Visit Diagnoses     COVID-19    -  Primary      Covid-19-isolation discussed.  Pt will do paxlovid. Tessalon perles.    No orders of the defined types were placed in this encounter.   I discussed the assessment and treatment plan with the patient. The patient was provided an opportunity to ask questions and all were answered. The patient agreed with the plan and demonstrated an understanding of the instructions.   The patient was advised to call back or seek an in-person evaluation if the symptoms worsen or if the condition fails to improve as anticipated.  I provided 10 minutes of face-to-face time during this encounter.   Wellington Hampshire,  MD [place] [phone] (phone) [phone] (fax)  [place]

## 2021-12-12 NOTE — Patient Instructions (Signed)
Meds have been sent the the pharmacy °You can take tylenol for pain/fevers °If worsening symptoms, let us know or go to the Emergency room  ° ° °

## 2021-12-13 ENCOUNTER — Encounter: Payer: Self-pay | Admitting: Family Medicine

## 2021-12-14 ENCOUNTER — Other Ambulatory Visit: Payer: Self-pay

## 2021-12-14 DIAGNOSIS — E782 Mixed hyperlipidemia: Secondary | ICD-10-CM

## 2021-12-14 MED ORDER — ROSUVASTATIN CALCIUM 20 MG PO TABS: 20.0000 mg | ORAL_TABLET | Freq: Every day | ORAL | 3 refills | Status: DC

## 2021-12-14 NOTE — Progress Notes (Signed)
Medication has been ordered.

## 2021-12-18 ENCOUNTER — Telehealth: Payer: Self-pay | Admitting: Family Medicine

## 2021-12-18 NOTE — Telephone Encounter (Signed)
Copied from [place] [id number]. Topic: Medicare AWV >> Dec 18, 2021 10:25 AM Harris-Coley, Hannah Beat wrote: Reason for CRM: Left message for patient to schedule Annual Wellness Visit.  Please schedule with Nurse Health Advisor Charlott Rakes, RN at [facility].  Please call [phone] ask for [facility]

## 2022-04-08 ENCOUNTER — Telehealth: Payer: Self-pay | Admitting: Family Medicine

## 2022-04-08 NOTE — Telephone Encounter (Signed)
Copied from [place]. Topic: Medicare AWV ?>> Apr 08, 2022  9:44 AM Harris-Coley, Hannah Beat wrote: ?Reason for CRM: Left message for patient to schedule Annual Wellness Visit.  Please schedule with Nurse Health Advisor Charlott Rakes, RN at [facility].  Please call [phone] ask for Juliann Pulse ?

## 2022-08-26 ENCOUNTER — Encounter: Payer: Self-pay | Admitting: *Deleted

## 2022-08-26 DIAGNOSIS — Z1231 Encounter for screening mammogram for malignant neoplasm of breast: Secondary | ICD-10-CM | POA: Diagnosis not present

## 2022-08-26 LAB — HM MAMMOGRAPHY

## 2022-08-30 ENCOUNTER — Encounter: Payer: Self-pay | Admitting: Family Medicine

## 2022-09-09 ENCOUNTER — Other Ambulatory Visit: Payer: Self-pay | Admitting: Family Medicine

## 2022-09-09 DIAGNOSIS — F339 Major depressive disorder, recurrent, unspecified: Secondary | ICD-10-CM

## 2022-10-07 ENCOUNTER — Telehealth: Payer: Self-pay | Admitting: Family Medicine

## 2022-10-07 NOTE — Telephone Encounter (Signed)
Copied from [place]. Topic: Medicare AWV >> Oct 07, 2022  9:35 AM Gillis Santa wrote: Reason for CRM: LVM FOR PATIENT TO CALL [phone] TO SCHEDULE AWV WITH HEALTH COACH

## 2022-11-06 ENCOUNTER — Telehealth: Payer: Medicare HMO | Admitting: Physician Assistant

## 2022-11-06 DIAGNOSIS — J208 Acute bronchitis due to other specified organisms: Secondary | ICD-10-CM

## 2022-11-06 DIAGNOSIS — B9689 Other specified bacterial agents as the cause of diseases classified elsewhere: Secondary | ICD-10-CM | POA: Diagnosis not present

## 2022-11-06 MED ORDER — AZITHROMYCIN 250 MG PO TABS: ORAL_TABLET | ORAL | 0 refills | Status: AC

## 2022-11-06 MED ORDER — BENZONATATE 100 MG PO CAPS: 100.0000 mg | ORAL_CAPSULE | Freq: Three times a day (TID) | ORAL | 0 refills | Status: DC | PRN

## 2022-11-06 NOTE — Progress Notes (Signed)
We are sorry that you are not feeling well.  Here is how we plan to help!  Based on your presentation I believe you most likely have A cough due to bacteria.  When patients have a fever and a productive cough with a change in color or increased sputum production, we are concerned about bacterial bronchitis.  If left untreated it can progress to pneumonia.  If your symptoms do not improve with your treatment plan it is important that you contact your provider.   I have prescribed Azithromyin 250 mg: two tablets now and then one tablet daily for 4 additonal days    In addition you may use A non-prescription cough medication called Mucinex DM: take 2 tablets every 12 hours. and A prescription cough medication called Tessalon Perles 100mg. You may take 1-2 capsules every 8 hours as needed for your cough.   From your responses in the eVisit questionnaire you describe inflammation in the upper respiratory tract which is causing a significant cough.  This is commonly called Bronchitis and has four common causes:   Allergies Viral Infections Acid Reflux Bacterial Infection Allergies, viruses and acid reflux are treated by controlling symptoms or eliminating the cause. An example might be a cough caused by taking certain blood pressure medications. You stop the cough by changing the medication. Another example might be a cough caused by acid reflux. Controlling the reflux helps control the cough.  USE OF BRONCHODILATOR ("RESCUE") INHALERS: There is a risk from using your bronchodilator too frequently.  The risk is that over-reliance on a medication which only relaxes the muscles surrounding the breathing tubes can reduce the effectiveness of medications prescribed to reduce swelling and congestion of the tubes themselves.  Although you feel brief relief from the bronchodilator inhaler, your asthma may actually be worsening with the tubes becoming more swollen and filled with mucus.  This can delay other  crucial treatments, such as oral steroid medications. If you need to use a bronchodilator inhaler daily, several times per day, you should discuss this with your provider.  There are probably better treatments that could be used to keep your asthma under control.     HOME CARE Only take medications as instructed by your medical team. Complete the entire course of an antibiotic. Drink plenty of fluids and get plenty of rest. Avoid close contacts especially the very young and the elderly Cover your mouth if you cough or cough into your sleeve. Always remember to wash your hands A steam or ultrasonic humidifier can help congestion.   GET HELP RIGHT AWAY IF: You develop worsening fever. You become short of breath You cough up blood. Your symptoms persist after you have completed your treatment plan MAKE SURE YOU  Understand these instructions. Will watch your condition. Will get help right away if you are not doing well or get worse.    Thank you for choosing an e-visit.  Your e-visit answers were reviewed by a board certified advanced clinical practitioner to complete your personal care plan. Depending upon the condition, your plan could have included both over the counter or prescription medications.  Please review your pharmacy choice. Make sure the pharmacy is open so you can pick up prescription now. If there is a problem, you may contact your provider through MyChart messaging and have the prescription routed to another pharmacy.  Your safety is important to us. If you have drug allergies check your prescription carefully.   For the next 24 hours you can use   MyChart to ask questions about today's visit, request a non-urgent call back, or ask for a work or school excuse. You will get an email in the next two days asking about your experience. I hope that your e-visit has been valuable and will speed your recovery.  I have spent 5 minutes in review of e-visit questionnaire, review and  updating patient chart, medical decision making and response to patient.   Pamelyn Bancroft M Redith Drach, PA-C  

## 2022-11-11 ENCOUNTER — Other Ambulatory Visit: Payer: Self-pay | Admitting: Family Medicine

## 2022-11-11 DIAGNOSIS — E782 Mixed hyperlipidemia: Secondary | ICD-10-CM

## 2022-11-14 ENCOUNTER — Encounter: Payer: Self-pay | Admitting: *Deleted

## 2022-11-28 ENCOUNTER — Telehealth: Payer: Self-pay | Admitting: Family Medicine

## 2022-11-28 NOTE — Telephone Encounter (Signed)
Patient states: -Will be out of medication prior to CPE on 12/25/22.   Patient requests: -PCP fill medication to hold her over until CPE on 12/25/22  LAST APPOINTMENT DATE:  12/12/21  NEXT APPOINTMENT DATE: 12/25/22  MEDICATION:buPROPion (WELLBUTRIN XL) 150 MG 24 hr tablet   Is the patient out of medication? No  PHARMACY: [facility] - [place], [place] - [address], [place] [address] Phone: [phone]  Fax: [phone]

## 2022-11-29 ENCOUNTER — Other Ambulatory Visit: Payer: Self-pay

## 2022-11-29 DIAGNOSIS — F339 Major depressive disorder, recurrent, unspecified: Secondary | ICD-10-CM

## 2022-11-29 MED ORDER — BUPROPION HCL ER (XL) 150 MG PO TB24: 150.0000 mg | ORAL_TABLET | Freq: Every day | ORAL | 0 refills | Status: DC

## 2022-11-29 NOTE — Telephone Encounter (Signed)
Rx sent 

## 2022-12-25 ENCOUNTER — Encounter: Payer: Medicare HMO | Admitting: Family Medicine

## 2022-12-27 ENCOUNTER — Telehealth (INDEPENDENT_AMBULATORY_CARE_PROVIDER_SITE_OTHER): Payer: Medicare HMO | Admitting: Internal Medicine

## 2022-12-27 ENCOUNTER — Encounter: Payer: Self-pay | Admitting: Internal Medicine

## 2022-12-27 DIAGNOSIS — U071 COVID-19: Secondary | ICD-10-CM | POA: Diagnosis not present

## 2022-12-27 MED ORDER — LORATADINE 10 MG PO TABS: 10.0000 mg | ORAL_TABLET | Freq: Every day | ORAL | 11 refills | Status: DC

## 2022-12-27 MED ORDER — SIMPLY SALINE 0.9 % NA AERS: 2.0000 | INHALATION_SPRAY | NASAL | 11 refills | Status: DC

## 2022-12-27 MED ORDER — PSEUDOEPHEDRINE HCL ER 120 MG PO TB12: 120.0000 mg | ORAL_TABLET | Freq: Two times a day (BID) | ORAL | 0 refills | Status: DC

## 2022-12-27 MED ORDER — FLUTICASONE PROPIONATE 50 MCG/ACT NA SUSP: 2.0000 | Freq: Every day | NASAL | 6 refills | Status: DC

## 2022-12-27 MED ORDER — NIRMATRELVIR/RITONAVIR (PAXLOVID)TABLET: 3.0000 | ORAL_TABLET | Freq: Two times a day (BID) | ORAL | 0 refills | Status: AC

## 2022-12-27 NOTE — Patient Instructions (Signed)
  For COVID: 1.  Self-Care Guidelines     - Rest and hydration     - Over-the-counter remedies for symptom relief     - Proper nutrition Quarantine Instructions: stay isolated to prevent the spread of the virus. Understanding COVID-19:COVID-19 is a virus that spreads through cough droplets mainly, consider coughs and sneezes highly contagious Medication Adherence:  taking the prescribed medication as directed. Preventing Spread: hand hygiene, wearing masks, and social distancing. When to Seek Help: shortness of breath, dizziness, feel really bad.   let us know how things are going.

## 2022-12-27 NOTE — Progress Notes (Signed)
[facility]: [phone]   Video Medical Office Visit - Video Telemedicine COVID management  Patient:  Courtney Hood (22-Apr-1946) located at [place] [address] MRN:   [id number]      Date:   12/27/2022  PCP:    Leamon Arnt, [place] Provider: Loralee Pacas, MD located at [facility] at [facility] [address] [place] [address] Today's Telemedicine visit was conducted via Video for 8 minutes after consent for telemedicine was obtained:  Video connection was never lost All video encounter participant identities and locations confirmed visually and verbally.   Assessment/Plan:   1. COVID - nirmatrelvir/ritonavir (PAXLOVID) 20 x 150 MG & 10 x '100MG'$  TABS; Take 3 tablets by mouth 2 (two) times daily for 5 days. (Take nirmatrelvir 150 mg two tablets twice daily for 5 days and ritonavir 100 mg one tablet twice daily for 5 days) Patient GFR is over 60  Dispense: 30 tablet; Refill: 0 - pseudoephedrine (SUDAFED 12 HOUR) 120 MG 12 hr tablet; Take 1 tablet (120 mg total) by mouth 2 (two) times daily.  Dispense: 20 tablet; Refill: 0 - loratadine (CLARITIN) 10 MG tablet; Take 1 tablet (10 mg total) by mouth daily.  Dispense: 30 tablet; Refill: 11 - fluticasone (FLONASE) 50 MCG/ACT nasal spray; Place 2 sprays into both nostrils daily.  Dispense: 16 g; Refill: 6 - Saline (SIMPLY SALINE) 0.9 % AERS; Place 2 each into the nose as directed. Use nightly for sinus hygiene long-term.  Can also be used as many times daily as desired to assist with clearing congested sinuses.  Dispense: 127 mL; Refill: 11    No barriers to understanding were identified.  The following patient education was placed in mychart AVS : For COVID: 1.  Self-Care Guidelines     - Rest and hydration     - Over-the-counter remedies for symptom relief     - Proper nutrition Quarantine Instructions: stay isolated to prevent the spread of the  virus. Understanding COVID-19:COVID-19 is a virus that spreads through cough droplets mainly, consider coughs and sneezes highly contagious Medication Adherence:  take the prescribed medication as directed. Preventing Spread: hand hygiene, wearing masks, and social distancing. When to Seek Help: shortness of breath, dizziness, feel really bad.   Let us know how things are going.      Subjective:   Courtney Hood is a 77 y.o. female with the following chart data reviewed: Patient Active Problem List   Diagnosis Date Noted   Family history of colon cancer 12/04/2020   Moderate episode of recurrent major depressive disorder ([place]) 03/16/2018   Alopecia 01/30/2017   B12 deficiency 08/15/2015   Right lumbar radiculopathy 04/14/2015   Primary hyperparathyroidism ([place]) 08/27/2010   Mixed hyperlipidemia 07/25/2010   Osteoporosis 07/25/2010   Outpatient Medications Prior to Visit  Medication Sig   buPROPion (WELLBUTRIN XL) 150 MG 24 hr tablet Take 1 tablet (150 mg total) by mouth daily.   calcium-vitamin D (OSCAL WITH D) 500-5 MG-MCG tablet Take 1 tablet by mouth 2 (two) times daily.   rosuvastatin (CRESTOR) 20 MG tablet TAKE 1 TABLET BY MOUTH EVERY DAY   sertraline (ZOLOFT) 100 MG tablet Take 2 tablets (200 mg total) by mouth daily.   [DISCONTINUED] benzonatate (TESSALON) 100 MG capsule Take 1 capsule (100 mg total) by mouth 3 (three) times daily as needed.   [DISCONTINUED] Cyanocobalamin (VITAMIN B-12 PO) Take by mouth daily.   No facility-administered medications prior  to visit.    Chief Complaint  Patient presents with   Covid Positive    Sunday.   Swollen glands   Nasal Congestion   Ear pressure   Cough    Dry and wet-producing very yellow mucus.    HPI  Positive test 2 D ago after initial negative test Paxlovid without problems in past No kidney problems Denies shortness of breath,dizziness, patient reports its all sinus symptom(s) and she had an exposure last week to  COVID positive.       Objective:  Physical Exam  She is in no acute distress.  She appears fatigued, but has no difficulty speaking in full sentences.  Normal work of breathing with no apparent use of accessory muscles.  Congested seeming but no cough was noted.  No cyanosis can be appreciated.  Awake, alert,  No obvious focal neurological deficits or cognitive impairments.  Mood is appropriate with pleasant demeanor.  Exam limited by video assessment   Loralee Pacas, MD

## 2023-01-15 ENCOUNTER — Encounter: Payer: Self-pay | Admitting: Family Medicine

## 2023-01-15 ENCOUNTER — Ambulatory Visit (INDEPENDENT_AMBULATORY_CARE_PROVIDER_SITE_OTHER): Payer: Medicare HMO | Admitting: Family Medicine

## 2023-01-15 VITALS — BP 118/58 | HR 74 | Temp 97.8°F | Ht 67.0 in | Wt 114.4 lb

## 2023-01-15 DIAGNOSIS — E538 Deficiency of other specified B group vitamins: Secondary | ICD-10-CM

## 2023-01-15 DIAGNOSIS — M810 Age-related osteoporosis without current pathological fracture: Secondary | ICD-10-CM | POA: Diagnosis not present

## 2023-01-15 DIAGNOSIS — Z780 Asymptomatic menopausal state: Secondary | ICD-10-CM

## 2023-01-15 DIAGNOSIS — Z0000 Encounter for general adult medical examination without abnormal findings: Secondary | ICD-10-CM | POA: Diagnosis not present

## 2023-01-15 DIAGNOSIS — F339 Major depressive disorder, recurrent, unspecified: Secondary | ICD-10-CM

## 2023-01-15 DIAGNOSIS — E782 Mixed hyperlipidemia: Secondary | ICD-10-CM | POA: Diagnosis not present

## 2023-01-15 DIAGNOSIS — R202 Paresthesia of skin: Secondary | ICD-10-CM

## 2023-01-15 DIAGNOSIS — E210 Primary hyperparathyroidism: Secondary | ICD-10-CM

## 2023-01-15 LAB — LIPID PANEL
Cholesterol: 187 mg/dL (ref 0–200)
HDL: 78 mg/dL (ref >39.00)
LDL Cholesterol: 95 mg/dL (ref 0–99)
NonHDL: 108.65
Total CHOL/HDL Ratio: 2
Triglycerides: 68 mg/dL (ref 0.0–149.0)
VLDL: 13.6 mg/dL (ref 0.0–40.0)

## 2023-01-15 LAB — CBC WITH DIFFERENTIAL/PLATELET
Basophils Absolute: 0 10*3/uL (ref 0.0–0.1)
Basophils Relative: 0.4 % (ref 0.0–3.0)
Eosinophils Absolute: 0.1 10*3/uL (ref 0.0–0.7)
Eosinophils Relative: 1.5 % (ref 0.0–5.0)
HCT: 43.1 % (ref 36.0–46.0)
Hemoglobin: 14.1 g/dL (ref 12.0–15.0)
Lymphocytes Relative: 37.5 % (ref 12.0–46.0)
Lymphs Abs: 2.1 10*3/uL (ref 0.7–4.0)
MCHC: 32.7 g/dL (ref 30.0–36.0)
MCV: 89.5 fl (ref 78.0–100.0)
Monocytes Absolute: 0.5 10*3/uL (ref 0.1–1.0)
Monocytes Relative: 8.2 % (ref 3.0–12.0)
Neutro Abs: 2.9 10*3/uL (ref 1.4–7.7)
Neutrophils Relative %: 52.4 % (ref 43.0–77.0)
Platelets: 175 10*3/uL (ref 150.0–400.0)
RBC: 4.82 Mil/uL (ref 3.87–5.11)
RDW: 14.2 % (ref 11.5–15.5)
WBC: 5.5 10*3/uL (ref 4.0–10.5)

## 2023-01-15 LAB — COMPREHENSIVE METABOLIC PANEL
ALT: 10 U/L (ref 0–35)
AST: 22 U/L (ref 0–37)
Albumin: 4.2 g/dL (ref 3.5–5.2)
Alkaline Phosphatase: 53 U/L (ref 39–117)
BUN: 12 mg/dL (ref 6–23)
CO2: 29 mEq/L (ref 19–32)
Calcium: 10.7 mg/dL — ABNORMAL HIGH (ref 8.4–10.5)
Chloride: 105 mEq/L (ref 96–112)
Creatinine, Ser: 0.73 mg/dL (ref 0.40–1.20)
GFR: 80.64 mL/min (ref >60.00)
Glucose, Bld: 84 mg/dL (ref 70–99)
Potassium: 4.1 mEq/L (ref 3.5–5.1)
Sodium: 141 mEq/L (ref 135–145)
Total Bilirubin: 0.4 mg/dL (ref 0.2–1.2)
Total Protein: 6.6 g/dL (ref 6.0–8.3)

## 2023-01-15 LAB — B12 AND FOLATE PANEL
Folate: 9.1 ng/mL (ref >5.9)
Vitamin B-12: 274 pg/mL (ref 211–911)

## 2023-01-15 LAB — TSH: TSH: 5.69 u[IU]/mL — ABNORMAL HIGH (ref 0.35–5.50)

## 2023-01-15 MED ORDER — SERTRALINE HCL 100 MG PO TABS: 200.0000 mg | ORAL_TABLET | Freq: Every day | ORAL | 3 refills | Status: DC

## 2023-01-15 MED ORDER — BUPROPION HCL ER (XL) 150 MG PO TB24: 150.0000 mg | ORAL_TABLET | Freq: Every day | ORAL | 0 refills | Status: DC

## 2023-01-15 MED ORDER — SHINGRIX 50 MCG/0.5ML IM SUSR: 0.5000 mL | Freq: Once | INTRAMUSCULAR | 0 refills | Status: AC

## 2023-01-15 MED ORDER — BUPROPION HCL ER (XL) 150 MG PO TB24: 150.0000 mg | ORAL_TABLET | Freq: Every day | ORAL | 3 refills | Status: DC

## 2023-01-15 NOTE — Patient Instructions (Addendum)
Please return in 12 months for your annual complete physical; please come fasting.   I will release your lab results to you on your MyChart account with further instructions. You may see the results before I do, but when I review them I will send you a message with my report or have my assistant call you if things need to be discussed. Please reply to my message with any questions. Thank you!   If you have any questions or concerns, please don't hesitate to send me a message via MyChart or call the office at [phone]. Thank you for visiting with [place] today! It's our pleasure caring for you.   I have ordered a mammogram and/or bone density for you as we discussed today: it is due now (last 01/2019) []$   Mammogram  [x]$   Bone Density  Please call the office checked below to schedule your appointment:  [x]$   [facility]      [place], [place]         []$   [facility]  [address] [place], [place]

## 2023-01-15 NOTE — Progress Notes (Signed)
Subjective  Chief Complaint  Patient presents with   Annual Exam    Pt here for Annual exam and is currently fasting     HPI: Courtney Hood is a 77 y.o. female who presents to [facility] [place] today for a Female Wellness Visit. She also has the concerns and/or needs as listed above in the chief complaint. These will be addressed in addition to the Health Maintenance Visit.   Wellness Visit: annual visit with health maintenance review and exam without Pap  HM: mammo current. CRC screen utd. Eligible for covid booster, flu vaccine and shingrix. Accepts shingrix only. Had covid twice last year and did well overall. Exercising. Light eater. Happy.  Chronic disease f/u and/or acute problem visit: (deemed necessary to be done in addition to the wellness visit): Depression; remains well controlled on meds w/o recent recurrences. No adverse effects. Osteoporosis: due for DEXA. See last cpe. We discussed prior treatments and plan.  Primary hyperparathyroidism: taking calcium supplements for bone health. Due for recheck.  HLD switched to crestor 20 last year for better control. Tolerates well. Fasting C/o 2 mo h/o right thumb paresthesias. Come and go and lasts for about 10 seconds. Only in right thumb. No clear pattern. She knits. No elbow or neck pain. No locking or weakness or pain.   Assessment  1. Annual physical exam   2. Mixed hyperlipidemia   3. Primary hyperparathyroidism (HCC)   4. Age-related osteoporosis without current pathological fracture   5. B12 deficiency   6. Major depression, recurrent, chronic ([place])   7. Asymptomatic menopausal state   8. Chronic recurrent major depressive disorder (HCC)   9. Paresthesia of finger      Plan  Female Wellness Visit: Age appropriate Health Maintenance and Prevention measures were discussed with patient. Included topics are cancer screening recommendations, ways to keep healthy (see AVS) including dietary and  exercise recommendations, regular eye and dental care, use of seat belts, and avoidance of moderate alcohol use and tobacco use.  BMI: discussed patient's BMI and encouraged positive lifestyle modifications to help get to or maintain a target BMI. HM needs and immunizations were addressed and ordered. See below for orders. See HM and immunization section for updates. Shingrix education and RX given. Declines flu and covid Routine labs and screening tests ordered including cmp, cbc and lipids where appropriate. Discussed recommendations regarding Vit D and calcium supplementation (see AVS)  Chronic disease management visit and/or acute problem visit: Osteoporosis: recheck dexa and rec prolia if indicated.  HLD fasting for recheck on crestor 20 nightly. Check lfts. Tolerates well. Hopefully will be at goal.  Depression: refilled wellbutrin xr 150 qd and sertraline 200 qd. Well controlled.  Paresthesias: likely mechanical impingement from oa/knitting. Check b12, folate and thyroid. Monitor. If worsening, she will let me know. Trial of vitamin b6 bid Recheck pth and monitoring calcium. If elevated, will d/c calcium supplements.   Follow up: 12 mo for cpe  Orders Placed This Encounter  Procedures   DG Bone Density   CBC with Differential/Platelet   Comprehensive metabolic panel   Lipid panel   TSH   PTH, Intact (ICMA) and Ionized Calcium   B12 and Folate Panel   Meds ordered this encounter  Medications   Zoster Vaccine Adjuvanted [facility]) injection    Sig: Inject 0.5 mLs into the muscle once for 1 dose. Please give 2nd dose 2-6 months after first dose    Dispense:  2 each  Refill:  0   DISCONTD: buPROPion (WELLBUTRIN XL) 150 MG 24 hr tablet    Sig: Take 1 tablet (150 mg total) by mouth daily.    Dispense:  60 tablet    Refill:  0    This prescription was filled on 09/09/2022. Any refills authorized will be placed on file.   sertraline (ZOLOFT) 100 MG tablet    Sig: Take 2 tablets  (200 mg total) by mouth daily.    Dispense:  180 tablet    Refill:  3   buPROPion (WELLBUTRIN XL) 150 MG 24 hr tablet    Sig: Take 1 tablet (150 mg total) by mouth daily.    Dispense:  90 tablet    Refill:  3    Please disregard the prior RX sent in for #60 w/o refills. Use this one instead. thanks      Body mass index is 17.92 kg/m. Wt Readings from Last 3 Encounters:  01/15/23 114 lb 6.4 oz (51.9 kg)  12/10/21 118 lb 12.8 oz (53.9 kg)  04/13/21 124 lb (56.2 kg)     Patient Active Problem List   Diagnosis Date Noted   Family history of colon cancer 12/04/2020    Priority: High   Primary hyperparathyroidism ([place]) 08/27/2010    Priority: High   Mixed hyperlipidemia 07/25/2010    Priority: High   Major depression, recurrent, chronic ([place]) 07/25/2010    Priority: High    Well controlled for many years on sertraline high dose and wellbutrin    Osteoporosis 07/25/2010    Priority: High    dexa 2010 DEXA @ [facility] 10/2012: -2.0  DEXA @ [facility] 12/2014: -1.9 B fem DEXA 01/2021: t = -2.5 left hip. Can offer therapy. Pt will strength train and get back on vit D / calcium. Will recheck 01/2023 and restart meds then. Has failed biphosphonates due to rash and stomach upset.     Alopecia 01/30/2017    Priority: Low   B12 deficiency 08/15/2015    Priority: Low    Hx same in [place], on monthly IM replacement in 2010, none since moved to [place] in 2011    Health Maintenance  Topic Date Due   Zoster Vaccines- Shingrix (1 of 2) Never done   Medicare Annual Wellness (AWV)  08/14/2016   DTaP/Tdap/Td (2 - Tdap) 07/25/2020   DEXA SCAN  01/11/2023   COVID-19 Vaccine (4 - 2023-24 season) 01/31/2023 (Originally 08/02/2022)   INFLUENZA VACCINE  02/17/2023 (Originally 07/02/2022)   MAMMOGRAM  08/27/2023   COLONOSCOPY (Pts 45-24yr Insurance coverage will need to be confirmed)  04/13/2026   Pneumonia Vaccine 75 Years old  Completed   Hepatitis C Screening  Completed   HPV VACCINES  Aged Out   Immunization  History  Administered Date(s) Administered   Fluad Quad(high Dose 65+) 10/18/2019, 08/25/2020, 12/10/2021   Influenza, High Dose Seasonal PF 10/13/2013, 10/08/2016, 09/10/2017   Influenza,inj,Quad PF,6+ Mos 08/15/2015, 01/20/2019   PFIZER(Purple Top)SARS-COV-2 Vaccination 01/10/2020, 02/04/2020, 09/19/2020   Pneumococcal Conjugate-13 10/03/2015   Pneumococcal Polysaccharide-23 02/07/2014   Td 07/25/2010   Zoster, Live 10/18/2011   We updated and reviewed the patient's past history in detail and it is documented below. Allergies: Patient is allergic to actonel [risedronate sodium]. Past Medical History Patient  has a past medical history of Depression, Hyperlipidemia, Osteopenia of femoral neck, bilateral, and [place]. Past Surgical History Patient  has a past surgical history that includes Appendectomy; Abdominal hysterectomy; and Colonoscopy. Family History: Patient family history includes Colon  cancer (age of onset: 76) in her brother; Leukemia in her father. Social History:  Patient  reports that she quit smoking about 23 years ago. Her smoking use included cigarettes. She has never used smokeless tobacco. She reports current alcohol use. She reports that she does not use drugs.  Review of Systems: Constitutional: negative for fever or malaise Ophthalmic: negative for photophobia, double vision or loss of vision Cardiovascular: negative for chest pain, dyspnea on exertion, or new LE swelling Respiratory: negative for SOB or persistent cough Gastrointestinal: negative for abdominal pain, change in bowel habits or melena Genitourinary: negative for dysuria or gross hematuria, no abnormal uterine bleeding or disharge Musculoskeletal: negative for new gait disturbance or muscular weakness Integumentary: negative for new or persistent rashes, no breast lumps Neurological: negative for TIA or stroke symptoms Psychiatric: negative for SI or  delusions Allergic/Immunologic: negative for hives  Patient Care Team    Relationship Specialty Notifications Start End  Leamon Arnt, MD PCP - General Family Medicine  10/18/19   Renato Shin, MD (Inactive)  Endocrinology  10/21/11   Milus Banister, MD  Gastroenterology  04/06/14   Lyndee Hensen, PT Physical Therapist Physical Therapy  11/01/19   Allyn Kenner, MD  Dermatology  12/04/20     Objective  Vitals: BP (!) 118/58   Pulse 74   Temp 97.8 F (36.6 C)   Ht 5' 7"$  (1.702 m)   Wt 114 lb 6.4 oz (51.9 kg)   SpO2 94%   BMI 17.92 kg/m  General:  Well developed, well nourished, no acute distress  Psych:  Alert and orientedx3,normal mood and affect HEENT:  Normocephalic, atraumatic, non-icteric sclera,  supple neck without adenopathy, mass or thyromegaly Cardiovascular:  Normal S1, S2, RRR without gallop, rub or murmur Respiratory:  Good breath sounds bilaterally, CTAB with normal respiratory effort Gastrointestinal: normal bowel sounds, soft, non-tender, no noted masses. No HSM MSK: no deformities, contusions. Joints are without erythema or swelling.  1st mcp subluxation w/o ttp, redness or warmth bilateral hands Skin:  Warm, no rashes or suspicious lesions noted Neurologic:    Mental status is normal. Gross motor and sensory exams are normal. Normal gait. No tremor   Commons side effects, risks, benefits, and alternatives for medications and treatment plan prescribed today were discussed, and the patient expressed understanding of the given instructions. Patient is instructed to call or message via MyChart if he/she has any questions or concerns regarding our treatment plan. No barriers to understanding were identified. We discussed Red Flag symptoms and signs in detail. Patient expressed understanding regarding what to do in case of urgent or emergency type symptoms.  Medication list was reconciled, printed and provided to the patient in AVS. Patient instructions and summary  information was reviewed with the patient as documented in the AVS. This note was prepared with assistance of Dragon voice recognition software. Occasional wrong-word or sound-a-like substitutions may have occurred due to the inherent limitations of voice recognition software

## 2023-01-16 LAB — PTH, INTACT (ICMA) AND IONIZED CALCIUM
Calcium, Ion: 5.6 mg/dL — ABNORMAL HIGH (ref 4.7–5.5)
Calcium: 10.7 mg/dL — ABNORMAL HIGH (ref 8.6–10.4)
PTH: 91 pg/mL — ABNORMAL HIGH (ref 16–77)

## 2023-01-24 ENCOUNTER — Encounter: Payer: Self-pay | Admitting: Family Medicine

## 2023-01-26 NOTE — Progress Notes (Signed)
Please see the mychart note below. Pt has not reviewed it so please call her to tell her the message/instructions as listed below.  thanks

## 2023-01-28 ENCOUNTER — Telehealth: Payer: Self-pay | Admitting: Family Medicine

## 2023-01-28 NOTE — Telephone Encounter (Signed)
Called patient to schedule Medicare Annual Wellness Visit (AWV). Left message for patient to call back and schedule Medicare Annual Wellness Visit (AWV).  Last date of AWV: N/A  Please schedule an appointment at any time with Otila Kluver, [facility].  If any questions, please contact me at [phone]. Thank you ,  Shaune Pollack [facility] AWV TEAM Direct Dial [phone]

## 2023-02-18 ENCOUNTER — Telehealth: Payer: Self-pay | Admitting: Family Medicine

## 2023-02-18 NOTE — Telephone Encounter (Signed)
Copied from [place] [phone]. Topic: Medicare AWV >> Feb 18, 2023  2:24 PM Gillis Santa wrote: Reason for CRM: Called patient to schedule Medicare Annual Wellness Visit (AWV). Left message for patient to call back and schedule Medicare Annual Wellness Visit (AWV).  Last date of AWV: N/A  Please schedule an appointment at any time with Otila Kluver, [facility].  Please schedule AWVI with Otila Kluver, [place].  If any questions, please contact me at [phone].  Thank you ,  Shaune Pollack [facility] AWV TEAM Direct Dial [phone]

## 2023-02-24 ENCOUNTER — Telehealth: Payer: Self-pay | Admitting: Family Medicine

## 2023-02-24 NOTE — Telephone Encounter (Signed)
Contacted Altha Harm to schedule their annual wellness visit. Appointment made for 03/03/2023.  [place] Direct Dial [phone]

## 2023-02-24 NOTE — Telephone Encounter (Signed)
Copied from [place] [phone]. Topic: Medicare AWV >> Feb 24, 2023  9:04 AM Gillis Santa wrote: Reason for CRM: Called patient to schedule Medicare Annual Wellness Visit (AWV). Left message for patient to call back and schedule Medicare Annual Wellness Visit (AWV).  Last date of AWV: n/a  Please schedule an appointment at any time with Otila Kluver, [facility].  Please schedule AWVI with Otila Kluver, [place].  If any questions, please contact me at [phone].  Thank you ,  Shaune Pollack [facility] AWV TEAM Direct Dial [phone]

## 2023-03-03 ENCOUNTER — Ambulatory Visit (INDEPENDENT_AMBULATORY_CARE_PROVIDER_SITE_OTHER): Payer: Medicare HMO

## 2023-03-03 VITALS — Wt 114.0 lb

## 2023-03-03 DIAGNOSIS — Z0000 Encounter for general adult medical examination without abnormal findings: Secondary | ICD-10-CM

## 2023-03-03 NOTE — Patient Instructions (Signed)
Courtney Hood , Thank you for taking time to come for your Medicare Wellness Visit. I appreciate your ongoing commitment to your health goals. Please review the following plan we discussed and let me know if I can assist you in the future.   These are the goals we discussed:  Goals      Patient Stated     Stay healthy and active         This is a list of the screening recommended for you and due dates:  Health Maintenance  Topic Date Due   Zoster (Shingles) Vaccine (1 of 2) Never done   DTaP/Tdap/Td vaccine (2 - Tdap) 07/25/2020   COVID-19 Vaccine (4 - 2023-24 season) 08/02/2022   DEXA scan (bone density measurement)  01/11/2023   Flu Shot  07/03/2023   Mammogram  08/27/2023   Medicare Annual Wellness Visit  03/02/2024   Colon Cancer Screening  04/13/2026   Pneumonia Vaccine  Completed   Hepatitis C Screening: USPSTF Recommendation to screen - Ages 18-79 yo.  Completed   HPV Vaccine  Aged Out    Advanced directives: Please bring a copy of your health care power of attorney and living will to the office at your convenience.  Conditions/risks identified: stay healthy and active   Next appointment: Follow up in one year for your annual wellness visit    Preventive Care 65 Years and Older, Female Preventive care refers to lifestyle choices and visits with your health care provider that can promote health and wellness. What does preventive care include? A yearly physical exam. This is also called an annual well check. Dental exams once or twice a year. Routine eye exams. Ask your health care provider how often you should have your eyes checked. Personal lifestyle choices, including: Daily care of your teeth and gums. Regular physical activity. Eating a healthy diet. Avoiding tobacco and drug use. Limiting alcohol use. Practicing safe sex. Taking low-dose aspirin every day. Taking vitamin and mineral supplements as recommended by your health care provider. What happens during  an annual well check? The services and screenings done by your health care provider during your annual well check will depend on your age, overall health, lifestyle risk factors, and family history of disease. Counseling  Your health care provider may ask you questions about your: Alcohol use. Tobacco use. Drug use. Emotional well-being. Home and relationship well-being. Sexual activity. Eating habits. History of falls. Memory and ability to understand (cognition). Work and work Statistician. Reproductive health. Screening  You may have the following tests or measurements: Height, weight, and BMI. Blood pressure. Lipid and cholesterol levels. These may be checked every 5 years, or more frequently if you are over 58 years old. Skin check. Lung cancer screening. You may have this screening every year starting at age 68 if you have a 30-pack-year history of smoking and currently smoke or have quit within the past 15 years. Fecal occult blood test (FOBT) of the stool. You may have this test every year starting at age 70. Flexible sigmoidoscopy or colonoscopy. You may have a sigmoidoscopy every 5 years or a colonoscopy every 10 years starting at age 84. Hepatitis C blood test. Hepatitis B blood test. Sexually transmitted disease (STD) testing. Diabetes screening. This is done by checking your blood sugar (glucose) after you have not eaten for a while (fasting). You may have this done every 1-3 years. Bone density scan. This is done to screen for osteoporosis. You may have this done starting at age  65. Mammogram. This may be done every 1-2 years. Talk to your health care provider about how often you should have regular mammograms. Talk with your health care provider about your test results, treatment options, and if necessary, the need for more tests. Vaccines  Your health care provider may recommend certain vaccines, such as: Influenza vaccine. This is recommended every year. Tetanus,  diphtheria, and acellular pertussis (Tdap, Td) vaccine. You may need a Td booster every 10 years. Zoster vaccine. You may need this after age 51. Pneumococcal 13-valent conjugate (PCV13) vaccine. One dose is recommended after age 72. Pneumococcal polysaccharide (PPSV23) vaccine. One dose is recommended after age 76. Talk to your health care provider about which screenings and vaccines you need and how often you need them. This information is not intended to replace advice given to you by your health care provider. Make sure you discuss any questions you have with your health care provider. Document Released: 12/15/2015 Document Revised: 08/07/2016 Document Reviewed: 09/19/2015 Elsevier Interactive Patient Education  2017 [place] Prevention in the Home Falls can cause injuries. They can happen to people of all ages. There are many things you can do to make your home safe and to help prevent falls. What can I do on the outside of my home? Regularly fix the edges of walkways and driveways and fix any cracks. Remove anything that might make you trip as you walk through a door, such as a raised step or threshold. Trim any bushes or trees on the path to your home. Use bright outdoor lighting. Clear any walking paths of anything that might make someone trip, such as rocks or tools. Regularly check to see if handrails are loose or broken. Make sure that both sides of any steps have handrails. Any raised decks and porches should have guardrails on the edges. Have any leaves, snow, or ice cleared regularly. Use sand or salt on walking paths during winter. Clean up any spills in your garage right away. This includes oil or grease spills. What can I do in the bathroom? Use night lights. Install grab bars by the toilet and in the tub and shower. Do not use towel bars as grab bars. Use non-skid mats or decals in the tub or shower. If you need to sit down in the shower, use a plastic,  non-slip stool. Keep the floor dry. Clean up any water that spills on the floor as soon as it happens. Remove soap buildup in the tub or shower regularly. Attach bath mats securely with double-sided non-slip rug tape. Do not have throw rugs and other things on the floor that can make you trip. What can I do in the bedroom? Use night lights. Make sure that you have a light by your bed that is easy to reach. Do not use any sheets or blankets that are too big for your bed. They should not hang down onto the floor. Have a firm chair that has side arms. You can use this for support while you get dressed. Do not have throw rugs and other things on the floor that can make you trip. What can I do in the kitchen? Clean up any spills right away. Avoid walking on wet floors. Keep items that you use a lot in easy-to-reach places. If you need to reach something above you, use a strong step stool that has a grab bar. Keep electrical cords out of the way. Do not use floor polish or wax that makes floors slippery.  If you must use wax, use non-skid floor wax. Do not have throw rugs and other things on the floor that can make you trip. What can I do with my stairs? Do not leave any items on the stairs. Make sure that there are handrails on both sides of the stairs and use them. Fix handrails that are broken or loose. Make sure that handrails are as long as the stairways. Check any carpeting to make sure that it is firmly attached to the stairs. Fix any carpet that is loose or worn. Avoid having throw rugs at the top or bottom of the stairs. If you do have throw rugs, attach them to the floor with carpet tape. Make sure that you have a light switch at the top of the stairs and the bottom of the stairs. If you do not have them, ask someone to add them for you. What else can I do to help prevent falls? Wear shoes that: Do not have high heels. Have rubber bottoms. Are comfortable and fit you well. Are closed  at the toe. Do not wear sandals. If you use a stepladder: Make sure that it is fully opened. Do not climb a closed stepladder. Make sure that both sides of the stepladder are locked into place. Ask someone to hold it for you, if possible. Clearly mark and make sure that you can see: Any grab bars or handrails. First and last steps. Where the edge of each step is. Use tools that help you move around (mobility aids) if they are needed. These include: Canes. Walkers. Scooters. Crutches. Turn on the lights when you go into a dark area. Replace any light bulbs as soon as they burn out. Set up your furniture so you have a clear path. Avoid moving your furniture around. If any of your floors are uneven, fix them. If there are any pets around you, be aware of where they are. Review your medicines with your doctor. Some medicines can make you feel dizzy. This can increase your chance of falling. Ask your doctor what other things that you can do to help prevent falls. This information is not intended to replace advice given to you by your health care provider. Make sure you discuss any questions you have with your health care provider. Document Released: 09/14/2009 Document Revised: 04/25/2016 Document Reviewed: 12/23/2014 Elsevier Interactive Patient Education  2017 [employer].

## 2023-03-03 NOTE — Progress Notes (Signed)
I connected with  Courtney Hood on 03/03/23 by a audio enabled telemedicine application and verified that I am speaking with the correct person using two identifiers.  Patient Location: Home  Provider Location: Office/Clinic  I discussed the limitations of evaluation and management by telemedicine. The patient expressed understanding and agreed to proceed.   Subjective:   Courtney Hood is a 77 y.o. female who presents for Medicare Annual (Subsequent) preventive examination.  Review of Systems     Cardiac Risk Factors include: advanced age (>68men, >49 women);dyslipidemia     Objective:    Today's Vitals   03/03/23 1307  Weight: 114 lb (51.7 kg)   Body mass index is 17.85 kg/m.     03/03/2023    1:11 PM 11/04/2019   11:38 AM 11/01/2019    1:53 PM 06/24/2017    4:16 PM  Advanced Directives  Does Patient Have a Medical Advance Directive? Yes No No Yes  Type of Paramedic of [place];Living will   Living will;Healthcare Power of [place] in Chart? No - copy requested   No - copy requested  Would patient like information on creating a medical advance directive?  No - Patient declined No - Patient declined     Current Medications (verified) Outpatient Encounter Medications as of 03/03/2023  Medication Sig   buPROPion (WELLBUTRIN XL) 150 MG 24 hr tablet Take 1 tablet (150 mg total) by mouth daily.   sertraline (ZOLOFT) 100 MG tablet Take 2 tablets (200 mg total) by mouth daily.   [DISCONTINUED] calcium-vitamin D (OSCAL WITH D) 500-5 MG-MCG tablet Take 1 tablet by mouth 2 (two) times daily.   [DISCONTINUED] rosuvastatin (CRESTOR) 20 MG tablet TAKE 1 TABLET BY MOUTH EVERY DAY   No facility-administered encounter medications on file as of 03/03/2023.    Allergies (verified) Actonel [risedronate sodium]   History: Past Medical History:  Diagnosis Date   Depression    Hyperlipidemia    Osteopenia of femoral neck,  bilateral    Primary hyperparathyroidism    Past Surgical History:  Procedure Laterality Date   ABDOMINAL HYSTERECTOMY     APPENDECTOMY     COLONOSCOPY     Family History  Problem Relation Age of Onset   Colon cancer Brother 4   Leukemia Father    Esophageal cancer Neg Hx    Stomach cancer Neg Hx    Rectal cancer Neg Hx    Social History   Socioeconomic History   Marital status: Married    Spouse name: Not on file   Number of children: Not on file   Years of education: Not on file   Highest education level: Not on file  Occupational History   Not on file  Tobacco Use   Smoking status: Former    Types: Cigarettes    Quit date: 12/06/1999    Years since quitting: 23.2   Smokeless tobacco: Never   Tobacco comments:    Married, lives wih spouse. Moved to [place] area from [place] in spring 2011  Vaping Use   Vaping Use: Never used  Substance and Sexual Activity   Alcohol use: Yes    Comment: social-maybe 1 glass wine twice a month.    Drug use: No   Sexual activity: Not on file  Other Topics Concern   Not on file  Social History Narrative   Goes by Courtney Hood.   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  Lives with husband here   2 children, live in [place], [place]   Social Determinants of Health   Financial Resource Strain: Low Risk  (02/24/2023)   Overall Financial Resource Strain (CARDIA)    Difficulty of Paying Living Expenses: Not hard at all  Food Insecurity: No Food Insecurity (02/24/2023)   Hunger Vital Sign    Worried About Running Out of Food in the Last Year: Never true    Ran Out of Food in the Last Year: Never true  Transportation Needs: No Transportation Needs (02/24/2023)   PRAPARE - Hydrologist (Medical): No    Lack of Transportation (Non-Medical): No  Physical Activity: Insufficiently Active (02/24/2023)   Exercise Vital Sign    Days of Exercise per Week: 2 days    Minutes of Exercise per Session: 30  min  Stress: No Stress Concern Present (02/24/2023)   [place]    Feeling of Stress : Only a little  Social Connections: Socially Integrated (02/24/2023)   Social Connection and Isolation Panel [NHANES]    Frequency of Communication with Friends and Family: More than three times a week    Frequency of Social Gatherings with Friends and Family: Twice a week    Attends Religious Services: More than 4 times per year    Active Member of [employer] or Organizations: Yes    Attends Music therapist: More than 4 times per year    Marital Status: Married    Tobacco Counseling Counseling given: Not Answered Tobacco comments: Married, lives wih spouse. Moved to [place] area from [place] in spring 2011   Clinical Intake:  Pre-visit preparation completed: Yes  Pain : No/denies pain     BMI - recorded: 17.85 Nutritional Status: BMI <19  Underweight Nutritional Risks: None Diabetes: No  How often do you need to have someone help you when you read instructions, pamphlets, or other written materials from your doctor or pharmacy?: 1 - Never  Diabetic?no  Interpreter Needed?: No  Information entered by :: Charlott Rakes, LPN   Activities of Daily Living    02/24/2023    2:58 PM  In your present state of health, do you have any difficulty performing the following activities:  Hearing? 0  Vision? 0  Difficulty concentrating or making decisions? 0  Walking or climbing stairs? 0  Dressing or bathing? 0  Doing errands, shopping? 0  Preparing Food and eating ? N  Using the Toilet? N  In the past six months, have you accidently leaked urine? N  Do you have problems with loss of bowel control? N  Managing your Medications? N  Managing your Finances? N  Housekeeping or managing your Housekeeping? N    Patient Care Team: Leamon Arnt, MD as PCP - General (Family Medicine) Renato Shin, MD (Inactive)  (Endocrinology) Milus Banister, MD (Gastroenterology) Lyndee Hensen, PT as Physical Therapist (Physical Therapy) Allyn Kenner, MD (Dermatology)  Indicate any recent Medical Services you may have received from other than [facility] providers in the past year (date may be approximate).     Assessment:   This is a routine wellness examination for Courtney Hood.  Hearing/Vision screen Hearing Screening - Comments:: Pt stated slight hearing loss  Vision Screening - Comments:: Pt encouraged to follow up with eye exams   Dietary issues and exercise activities discussed: Current Exercise Habits: Home exercise routine, Type of exercise: walking, Time (Minutes): 30, Frequency (Times/Week): 2, Weekly Exercise (Minutes/Week):  60   Goals Addressed             This Visit's Progress    Patient Stated       Stay healthy and active        Depression Screen    03/03/2023    1:09 PM 01/15/2023    9:23 AM 12/10/2021    3:09 PM 12/04/2020    2:25 PM 10/20/2020    2:35 PM 08/25/2020    3:47 PM 06/23/2020   11:23 AM  PHQ 2/9 Scores  PHQ - 2 Score 0 4 6 0 2 5 6   PHQ- 9 Score 0 5 11  5 12 15     Fall Risk    02/24/2023    2:58 PM 01/15/2023    9:23 AM 12/10/2021    3:08 PM 12/04/2020    2:25 PM 06/23/2020   11:12 AM  Fall Risk   Falls in the past year? 0 0 0 0 0  Number falls in past yr: 0 0 0 0   Injury with Fall? 0 0 0 0   Risk for fall due to : Impaired vision No Fall Risks   No Fall Risks  Follow up Falls prevention discussed Falls evaluation completed       FALL RISK PREVENTION PERTAINING TO THE HOME:  Any stairs in or around the home? Yes  If so, are there any without handrails? No  Home free of loose throw rugs in walkways, pet beds, electrical cords, etc? Yes  Adequate lighting in your home to reduce risk of falls? Yes   ASSISTIVE DEVICES UTILIZED TO PREVENT FALLS:  Life alert? No  Use of a cane, walker or w/c? No  Grab bars in the bathroom? Yes  Shower chair or bench in shower? Yes   Elevated toilet seat or a handicapped toilet? No   TIMED UP AND GO:  Was the test performed? No .   Cognitive Function:        03/03/2023    1:12 PM  6CIT Screen  What Year? 0 points  What month? 0 points  What time? 0 points  Count back from 20 0 points  Months in reverse 0 points  Repeat phrase 0 points  Total Score 0 points    Immunizations Immunization History  Administered Date(s) Administered   Fluad Quad(high Dose 65+) 10/18/2019, 08/25/2020, 12/10/2021   Influenza, High Dose Seasonal PF 10/13/2013, 10/08/2016, 09/10/2017   Influenza,inj,Quad PF,6+ Mos 08/15/2015, 01/20/2019   PFIZER(Purple Top)SARS-COV-2 Vaccination 01/10/2020, 02/04/2020, 09/19/2020   Pneumococcal Conjugate-13 10/03/2015   Pneumococcal Polysaccharide-23 02/07/2014   Td 07/25/2010   Zoster, Live 10/18/2011    TDAP status: Due, Education has been provided regarding the importance of this vaccine. Advised may receive this vaccine at local pharmacy or Health Dept. Aware to provide a copy of the vaccination record if obtained from local pharmacy or Health Dept. Verbalized acceptance and understanding.  Flu Vaccine status: Up to date  Pneumococcal vaccine status: Up to date  Covid-19 vaccine status: Completed vaccines  Qualifies for Shingles Vaccine? Yes   Zostavax completed No   Shingrix Completed?: No.    Education has been provided regarding the importance of this vaccine. Patient has been advised to call insurance company to determine out of pocket expense if they have not yet received this vaccine. Advised may also receive vaccine at local pharmacy or Health Dept. Verbalized acceptance and understanding.  Screening Tests Health Maintenance  Topic Date Due   Zoster Vaccines- Shingrix (1  of 2) Never done   DTaP/Tdap/Td (2 - Tdap) 07/25/2020   COVID-19 Vaccine (4 - 2023-24 season) 08/02/2022   DEXA SCAN  01/11/2023   INFLUENZA VACCINE  07/03/2023   MAMMOGRAM  08/27/2023   Medicare Annual  Wellness (AWV)  03/02/2024   COLONOSCOPY (Pts 45-6yrs Insurance coverage will need to be confirmed)  04/13/2026   Pneumonia Vaccine 25+ Years old  Completed   Hepatitis C Screening  Completed   HPV VACCINES  Aged Out    Health Maintenance  Health Maintenance Due  Topic Date Due   Zoster Vaccines- Shingrix (1 of 2) Never done   DTaP/Tdap/Td (2 - Tdap) 07/25/2020   COVID-19 Vaccine (4 - 2023-24 season) 08/02/2022   DEXA SCAN  01/11/2023    Colorectal cancer screening: Type of screening: Colonoscopy. Completed 04/13/21. Repeat every 5 years  Mammogram status: Completed 08/26/22. Repeat every year  Bone Density status: Completed 01/11/21. Results reflect: Bone density results: OSTEOPOROSIS. Repeat every 2 years.   Additional Screening:  Hepatitis C Screening:  Completed 08/15/15  Vision Screening: Recommended annual ophthalmology exams for early detection of glaucoma and other disorders of the eye. Is the patient up to date with their annual eye exam?  No  Who is the provider or what is the name of the office in which the patient attends annual eye exams? Encouraged to follow up witheye provider  If pt is not established with a provider, would they like to be referred to a provider to establish care? No .   Dental Screening: Recommended annual dental exams for proper oral hygiene  Community Resource Referral / Chronic Care Management: CRR required this visit?  No   CCM required this visit?  No      Plan:     I have personally reviewed and noted the following in the patient's chart:   Medical and social history Use of alcohol, tobacco or illicit drugs  Current medications and supplements including opioid prescriptions. Patient is not currently taking opioid prescriptions. Functional ability and status Nutritional status Physical activity Advanced directives List of other physicians Hospitalizations, surgeries, and ER visits in previous 12 months Vitals Screenings to  include cognitive, depression, and falls Referrals and appointments  In addition, I have reviewed and discussed with patient certain preventive protocols, quality metrics, and best practice recommendations. A written personalized care plan for preventive services as well as general preventive health recommendations were provided to patient.     Willette Brace, LPN   [id number]   Nurse Notes: none

## 2023-03-05 NOTE — Progress Notes (Signed)
I connected with  Courtney Hood on 03/05/23 by a audio enabled telemedicine application and verified that I am speaking with the correct person using two identifiers.  Patient Location: Home  Provider Location: Office/Clinic  I discussed the limitations of evaluation and management by telemedicine. The patient expressed understanding and agreed to proceed.    Patient Medicare AWV questionnaire was completed by the patient on 02/24/23; I have confirmed that all information answered by patient is correct and no changes since this date.      Subjective:   Courtney Hood is a 77 y.o. female who presents for Medicare Annual (Subsequent) preventive examination.  Review of Systems     Cardiac Risk Factors include: advanced age (>16men, >36 women);dyslipidemia     Objective:    Today's Vitals   03/03/23 1307  Weight: 114 lb (51.7 kg)   Body mass index is 17.85 kg/m.     03/03/2023    1:11 PM 11/04/2019   11:38 AM 11/01/2019    1:53 PM 06/24/2017    4:16 PM  Advanced Directives  Does Patient Have a Medical Advance Directive? Yes No No Yes  Type of Paramedic of [place];Living will   Living will;Healthcare Power of [place] in Chart? No - copy requested   No - copy requested  Would patient like information on creating a medical advance directive?  No - Patient declined No - Patient declined     Current Medications (verified) Outpatient Encounter Medications as of 03/03/2023  Medication Sig   buPROPion (WELLBUTRIN XL) 150 MG 24 hr tablet Take 1 tablet (150 mg total) by mouth daily.   sertraline (ZOLOFT) 100 MG tablet Take 2 tablets (200 mg total) by mouth daily.   [DISCONTINUED] calcium-vitamin D (OSCAL WITH D) 500-5 MG-MCG tablet Take 1 tablet by mouth 2 (two) times daily.   [DISCONTINUED] rosuvastatin (CRESTOR) 20 MG tablet TAKE 1 TABLET BY MOUTH EVERY DAY   No facility-administered encounter medications on file as  of 03/03/2023.    Allergies (verified) Actonel [risedronate sodium]   History: Past Medical History:  Diagnosis Date   Depression    Hyperlipidemia    Osteopenia of femoral neck, bilateral    Primary hyperparathyroidism    Past Surgical History:  Procedure Laterality Date   ABDOMINAL HYSTERECTOMY     APPENDECTOMY     COLONOSCOPY     Family History  Problem Relation Age of Onset   Colon cancer Brother 42   Leukemia Father    Esophageal cancer Neg Hx    Stomach cancer Neg Hx    Rectal cancer Neg Hx    Social History   Socioeconomic History   Marital status: Married    Spouse name: Not on file   Number of children: Not on file   Years of education: Not on file   Highest education level: Not on file  Occupational History   Not on file  Tobacco Use   Smoking status: Former    Types: Cigarettes    Quit date: 12/06/1999    Years since quitting: 23.2   Smokeless tobacco: Never   Tobacco comments:    Married, lives wih spouse. Moved to [place] area from [place] in spring 2011  Vaping Use   Vaping Use: Never used  Substance and Sexual Activity   Alcohol use: Yes    Comment: social-maybe 1 glass wine twice a month.    Drug use: No   Sexual activity: Not on  file  Other Topics Concern   Not on file  Social History Narrative   Goes by [name].   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   Lives with husband here   2 children, live in [place], [place]   Social Determinants of Health   Financial Resource Strain: [place]  (02/24/2023)   Overall Financial Resource Strain (CARDIA)    Difficulty of Paying Living Expenses: Not hard at all  Food Insecurity: No Food Insecurity (02/24/2023)   Hunger Vital Sign    Worried About Running Out of Food in the Last Year: Never true    Ran Out of Food in the Last Year: Never true  Transportation Needs: No Transportation Needs (02/24/2023)   PRAPARE - Hydrologist (Medical): No    Lack of  Transportation (Non-Medical): No  Physical Activity: Insufficiently Active (02/24/2023)   Exercise Vital Sign    Days of Exercise per Week: 2 days    Minutes of Exercise per Session: 30 min  Stress: No Stress Concern Present (02/24/2023)   [place]    Feeling of Stress : Only a little  Social Connections: Socially Integrated (02/24/2023)   Social Connection and Isolation Panel [NHANES]    Frequency of Communication with Friends and Family: More than three times a week    Frequency of Social Gatherings with Friends and Family: Twice a week    Attends Religious Services: More than 4 times per year    Active Member of [employer] or Organizations: Yes    Attends Music therapist: More than 4 times per year    Marital Status: Married    Tobacco Counseling Counseling given: Not Answered Tobacco comments: Married, lives wih spouse. Moved to [place] area from [place] in spring 2011   Clinical Intake:  Pre-visit preparation completed: Yes  Pain : No/denies pain     BMI - recorded: 17.85 Nutritional Status: BMI <19  Underweight Nutritional Risks: None Diabetes: No  How often do you need to have someone help you when you read instructions, pamphlets, or other written materials from your doctor or pharmacy?: 1 - Never  Diabetic?no  Interpreter Needed?: No  Information entered by :: Charlott Rakes, LPN   Activities of Daily Living    02/24/2023    2:58 PM  In your present state of health, do you have any difficulty performing the following activities:  Hearing? 0  Vision? 0  Difficulty concentrating or making decisions? 0  Walking or climbing stairs? 0  Dressing or bathing? 0  Doing errands, shopping? 0  Preparing Food and eating ? N  Using the Toilet? N  In the past six months, have you accidently leaked urine? N  Do you have problems with loss of bowel control? N  Managing your Medications? N   Managing your Finances? N  Housekeeping or managing your Housekeeping? N    Patient Care Team: Leamon Arnt, MD as PCP - General (Family Medicine) Renato Shin, MD (Inactive) (Endocrinology) Milus Banister, MD (Gastroenterology) Lyndee Hensen, PT as Physical Therapist (Physical Therapy) Allyn Kenner, MD (Dermatology)  Indicate any recent Medical Services you may have received from other than [facility] providers in the past year (date may be approximate).     Assessment:   This is a routine wellness examination for Courtney Hood.  Hearing/Vision screen Hearing Screening - Comments:: Pt stated slight hearing loss  Vision Screening - Comments::  Pt encouraged to follow up with eye exams   Dietary issues and exercise activities discussed: Current Exercise Habits: Home exercise routine, Type of exercise: walking, Time (Minutes): 30, Frequency (Times/Week): 2, Weekly Exercise (Minutes/Week): 60   Goals Addressed             This Visit's Progress    Patient Stated       Stay healthy and active       Depression Screen    03/03/2023    1:09 PM 01/15/2023    9:23 AM 12/10/2021    3:09 PM 12/04/2020    2:25 PM 10/20/2020    2:35 PM 08/25/2020    3:47 PM 06/23/2020   11:23 AM  PHQ 2/9 Scores  PHQ - 2 Score 0 4 6 0 2 5 6   PHQ- 9 Score 0 5 11  5 12 15     Fall Risk    02/24/2023    2:58 PM 01/15/2023    9:23 AM 12/10/2021    3:08 PM 12/04/2020    2:25 PM 06/23/2020   11:12 AM  Fall Risk   Falls in the past year? 0 0 0 0 0  Number falls in past yr: 0 0 0 0   Injury with Fall? 0 0 0 0   Risk for fall due to : Impaired vision No Fall Risks   No Fall Risks  Follow up Falls prevention discussed Falls evaluation completed       FALL RISK PREVENTION PERTAINING TO THE HOME:  Any stairs in or around the home? Yes  If so, are there any without handrails? No  Home free of loose throw rugs in walkways, pet beds, electrical cords, etc? Yes  Adequate lighting in your home to reduce risk of  falls? Yes   ASSISTIVE DEVICES UTILIZED TO PREVENT FALLS:  Life alert? No  Use of a cane, walker or w/c? No  Grab bars in the bathroom? Yes  Shower chair or bench in shower? Yes  Elevated toilet seat or a handicapped toilet? No   TIMED UP AND GO:  Was the test performed? No .   Cognitive Function:        03/03/2023    1:12 PM  6CIT Screen  What Year? 0 points  What month? 0 points  What time? 0 points  Count back from 20 0 points  Months in reverse 0 points  Repeat phrase 0 points  Total Score 0 points    Immunizations Immunization History  Administered Date(s) Administered   Fluad Quad(high Dose 65+) 10/18/2019, 08/25/2020, 12/10/2021   Influenza, High Dose Seasonal PF 10/13/2013, 10/08/2016, 09/10/2017   Influenza,inj,Quad PF,6+ Mos 08/15/2015, 01/20/2019   PFIZER(Purple Top)SARS-COV-2 Vaccination 01/10/2020, 02/04/2020, 09/19/2020   Pneumococcal Conjugate-13 10/03/2015   Pneumococcal Polysaccharide-23 02/07/2014   Td 07/25/2010   Zoster, Live 10/18/2011    TDAP status: Due, Education has been provided regarding the importance of this vaccine. Advised may receive this vaccine at local pharmacy or Health Dept. Aware to provide a copy of the vaccination record if obtained from local pharmacy or Health Dept. Verbalized acceptance and understanding.  Flu Vaccine status: Up to date  Pneumococcal vaccine status: Up to date  Covid-19 vaccine status: Completed vaccines  Qualifies for Shingles Vaccine? Yes   Zostavax completed No   Shingrix Completed?: No.    Education has been provided regarding the importance of this vaccine. Patient has been advised to call insurance company to determine out of pocket expense if they have not  yet received this vaccine. Advised may also receive vaccine at local pharmacy or Health Dept. Verbalized acceptance and understanding.  Screening Tests Health Maintenance  Topic Date Due   Zoster Vaccines- Shingrix (1 of 2) Never done    DTaP/Tdap/Td (2 - Tdap) 07/25/2020   COVID-19 Vaccine (4 - 2023-24 season) 08/02/2022   DEXA SCAN  01/11/2023   INFLUENZA VACCINE  07/03/2023   MAMMOGRAM  08/27/2023   Medicare Annual Wellness (AWV)  03/02/2024   COLONOSCOPY (Pts 45-90yrs Insurance coverage will need to be confirmed)  04/13/2026   Pneumonia Vaccine 59+ Years old  Completed   Hepatitis C Screening  Completed   HPV VACCINES  Aged Out    Health Maintenance  Health Maintenance Due  Topic Date Due   Zoster Vaccines- Shingrix (1 of 2) Never done   DTaP/Tdap/Td (2 - Tdap) 07/25/2020   COVID-19 Vaccine (4 - 2023-24 season) 08/02/2022   DEXA SCAN  01/11/2023    Colorectal cancer screening: Type of screening: Colonoscopy. Completed 04/13/21. Repeat every 5 years  Mammogram status: Completed 08/26/22. Repeat every year  Bone Density status: Completed 01/11/21. Results reflect: Bone density results: OSTEOPOROSIS. Repeat every 2 years.   Additional Screening:  Hepatitis C Screening:  Completed 08/15/15  Vision Screening: Recommended annual ophthalmology exams for early detection of glaucoma and other disorders of the eye. Is the patient up to date with their annual eye exam?  No  Who is the provider or what is the name of the office in which the patient attends annual eye exams? Encouraged to follow up witheye provider  If pt is not established with a provider, would they like to be referred to a provider to establish care? No .   Dental Screening: Recommended annual dental exams for proper oral hygiene  Community Resource Referral / Chronic Care Management: CRR required this visit?  No   CCM required this visit?  No      Plan:     I have personally reviewed and noted the following in the patient's chart:   Medical and social history Use of alcohol, tobacco or illicit drugs  Current medications and supplements including opioid prescriptions. Patient is not currently taking opioid prescriptions. Functional  ability and status Nutritional status Physical activity Advanced directives List of other physicians Hospitalizations, surgeries, and ER visits in previous 12 months Vitals Screenings to include cognitive, depression, and falls Referrals and appointments  In addition, I have reviewed and discussed with patient certain preventive protocols, quality metrics, and best practice recommendations. A written personalized care plan for preventive services as well as general preventive health recommendations were provided to patient.     Willette Brace, LPN   [id number]   Nurse Notes: none

## 2023-09-09 DIAGNOSIS — Z1231 Encounter for screening mammogram for malignant neoplasm of breast: Secondary | ICD-10-CM | POA: Diagnosis not present

## 2023-09-09 LAB — HM MAMMOGRAPHY

## 2023-09-11 ENCOUNTER — Encounter: Payer: Self-pay | Admitting: Family Medicine

## 2023-10-06 ENCOUNTER — Other Ambulatory Visit: Payer: Self-pay | Admitting: Family Medicine

## 2023-10-06 DIAGNOSIS — F339 Major depressive disorder, recurrent, unspecified: Secondary | ICD-10-CM

## 2023-10-20 ENCOUNTER — Other Ambulatory Visit: Payer: Self-pay | Admitting: Family Medicine

## 2023-12-04 ENCOUNTER — Telehealth: Payer: Medicare HMO | Admitting: Physician Assistant

## 2023-12-04 DIAGNOSIS — H66012 Acute suppurative otitis media with spontaneous rupture of ear drum, left ear: Secondary | ICD-10-CM

## 2023-12-04 MED ORDER — AMOXICILLIN-POT CLAVULANATE 875-125 MG PO TABS: 1.0000 | ORAL_TABLET | Freq: Two times a day (BID) | ORAL | 0 refills | Status: DC

## 2023-12-04 NOTE — Progress Notes (Signed)

## 2023-12-10 ENCOUNTER — Telehealth: Payer: Medicare HMO | Admitting: Nurse Practitioner

## 2023-12-10 DIAGNOSIS — H66015 Acute suppurative otitis media with spontaneous rupture of ear drum, recurrent, left ear: Secondary | ICD-10-CM

## 2023-12-10 MED ORDER — CIPROFLOXACIN-DEXAMETHASONE 0.3-0.1 % OT SUSP: 4.0000 [drp] | Freq: Two times a day (BID) | OTIC | 0 refills | Status: DC

## 2023-12-10 NOTE — Progress Notes (Signed)
 E-Visit for Ear Pain - Acute Otitis Media   We are sorry that you are not feeling well. Here is how we plan to help!  Based on what you have shared with me it looks like you have Acute Otitis Media.  Acute Otitis Media is an infection of the middle or inner ear. This type of infection can cause redness, inflammation, and fluid buildup behind the tympanic membrane (ear drum).  The usual symptoms include: Earache/Pain Fever Upper respiratory symptoms Lack of energy/Fatigue/Malaise Slight hearing loss gradually worsening- if the inner ear fills with fluid What causes middle ear infections? Most middle ear infections occur when an infection such as a cold, leads to a build-up of mucus in the middle ear and causes the Eustachian tube (a thin tube that runs from the middle ear to the back of the nose) to become swollen or blocked.   This means mucus can't drain away properly, making it easier for an infection to spread into the middle ear.  How middle ear infections are treated: Most ear infections clear up within three to five days and don't need any specific treatment. If necessary, tylenol  or ibuprofen should be used to relieve pain and a high temperature.  If you develop a fever higher than 102, or any significantly worsening symptoms, this could indicate a more serious infection moving to the middle/inner and needs face to face evaluation in an office by a provider.   Antibiotics aren't routinely used to treat middle ear infections, although they may occasionally be prescribed if symptoms persist or are particularly severe. Given your presentation,    I have prescribed: Ciprofloxin 0.2% and dexamethasone  1% otic suspension 3 drops in affected ears twice daily for 7 days   Your symptoms should improve over the next 3 days and should resolve in about 7 days. Be sure to complete ALL of the prescription(s) given.  HOME CARE: Wash your hands frequently. If you are prescribed an ear drop, do  not place the tip of the bottle on your ear or touch it with your fingers. You can take Acetaminophen  650 mg every 4-6 hours as needed for pain.  If pain is severe or moderate, you can apply a heating pad (set on low) or hot water bottle (wrapped in a towel) to outer ear for 20 minutes.  This will also increase drainage.  GET HELP RIGHT AWAY IF: Fever is over 102.2 degrees. You develop progressive ear pain or hearing loss. Ear symptoms persist longer than 3 days after treatment.  MAKE SURE YOU: Understand these instructions. Will watch your condition. Will get help right away if you are not doing well or get worse.  Thank you for choosing an e-visit.  Your e-visit answers were reviewed by a board certified advanced clinical practitioner to complete your personal care plan. Depending upon the condition, your plan could have included both over the counter or prescription medications.  Please review your pharmacy choice. Make sure the pharmacy is open so you can pick up the prescription now. If there is a problem, you may contact your provider through [employer] and have the prescription routed to another pharmacy.  Your safety is important to us . If you have drug allergies check your prescription carefully.   For the next 24 hours you can use MyChart to ask questions about today's visit, request a non-urgent call back, or ask for a work or school excuse. You will get an email with a survey after your eVisit asking about your  experience. We would appreciate your feedback. I hope that your e-visit has been valuable and will aid in your recovery.  I spent approximately 5 minutes reviewing the patient's history, current symptoms and coordinating their care today.

## 2023-12-11 ENCOUNTER — Other Ambulatory Visit: Payer: Self-pay | Admitting: Physician Assistant

## 2023-12-11 MED ORDER — NEOMYCIN-POLYMYXIN-HC 3.5-10000-1 OT SOLN: OTIC | 0 refills | Status: AC

## 2024-01-13 ENCOUNTER — Other Ambulatory Visit: Payer: Self-pay | Admitting: Family Medicine

## 2024-01-13 DIAGNOSIS — Z13820 Encounter for screening for osteoporosis: Secondary | ICD-10-CM

## 2024-01-24 ENCOUNTER — Ambulatory Visit
Admission: RE | Admit: 2024-01-24 | Discharge: 2024-01-24 | Disposition: A | Discharge status: Home / Self Care | Payer: Medicare HMO | Source: Ambulatory Visit | Attending: Family Medicine | Admitting: Family Medicine

## 2024-01-24 DIAGNOSIS — E2839 Other primary ovarian failure: Secondary | ICD-10-CM | POA: Diagnosis not present

## 2024-01-24 DIAGNOSIS — Z90722 Acquired absence of ovaries, bilateral: Secondary | ICD-10-CM | POA: Diagnosis not present

## 2024-01-24 DIAGNOSIS — Z13820 Encounter for screening for osteoporosis: Secondary | ICD-10-CM

## 2024-01-24 DIAGNOSIS — M8588 Other specified disorders of bone density and structure, other site: Secondary | ICD-10-CM | POA: Diagnosis not present

## 2024-01-24 DIAGNOSIS — N958 Other specified menopausal and perimenopausal disorders: Secondary | ICD-10-CM | POA: Diagnosis not present

## 2024-02-03 ENCOUNTER — Encounter: Payer: Self-pay | Admitting: Family Medicine

## 2024-02-03 NOTE — Progress Notes (Signed)
 See mychart note.  Please call her and discuss her results as written below. Thanks.   Courtney Hood, Thank you for getting your bone density screening test done. I have reviewed the results. Unfortunately, the bone density has worsened significantly over the last 2 years and I recommend an office visit so we can discuss treatment options again. This will lessen your risks of complications including compression fractures and hip/wrist fractures in the future. Please call to get scheduled. You are now overdue for your annual physical as well.  I look forward to seeing you.  Sincerely, Dr. Mardelle Matte   DEXA 01/2024: lowest t = -3.3!! Needs treatment. Evenity vs prolia. Ov to discuss offered. cla

## 2024-02-05 ENCOUNTER — Telehealth: Payer: Self-pay

## 2024-02-05 NOTE — Telephone Encounter (Signed)
 Evenity VOB initiated via [email or web address]

## 2024-02-05 NOTE — Telephone Encounter (Signed)
 Prolia VOB initiated via [email or web address]

## 2024-02-05 NOTE — Progress Notes (Signed)
 Noted.

## 2024-02-10 NOTE — Telephone Encounter (Signed)
 Pharmacy Patient Advocate Encounter   Received notification from  [employer] Portal that prior authorization for Prolia is required/requested.   Insurance verification completed.   The patient is insured through [employer] .   Per test claim: PA required; PA submitted to above mentioned insurance via CoverMyMeds Key/confirmation #/EOC [id number] Status is pending

## 2024-02-10 NOTE — Telephone Encounter (Signed)
 Pharmacy Patient Advocate Encounter   Received notification from  [employer] Portal that prior authorization for Evenity is required/requested.   Insurance verification completed.   The patient is insured through [employer] .   Per test claim: PA required; PA submitted to above mentioned insurance via CoverMyMeds Key/confirmation #/EOC [id number] Status is pending

## 2024-02-10 NOTE — Telephone Encounter (Signed)
 Courtney Hood

## 2024-02-10 NOTE — Telephone Encounter (Signed)
 Marland Kitchen

## 2024-02-17 NOTE — Telephone Encounter (Signed)
 Pharmacy Patient Advocate Encounter  Received notification from [facility] that Prior Authorization for Prolia  has been APPROVED from 02/10/24 to 12/01/24   PA #/Case ID/Reference #:  [id number]

## 2024-02-18 ENCOUNTER — Other Ambulatory Visit (HOSPITAL_COMMUNITY): Payer: Self-pay

## 2024-02-18 NOTE — Telephone Encounter (Signed)
 LVM for pt to let her know her options on the Prolia.. If sending here it would be 365 and if it is sent to the pharmacy in would be  $889.94. Waiting in response from pt to see which option she wold prefer.

## 2024-02-18 NOTE — Telephone Encounter (Signed)
 Pt ready for scheduling for Evenity on or after : --  Option# 1 Buy/Bill (Office supplied medication)  Out-of-pocket cost due at time of  office visit: $507 Number of injection/visits approved: --  Primary: [employer] - Medicare Evenity co-insurance: 20% (approximately $482) Admin fee co-insurance: 20% (approximately $25  Secondary: N/A Evenity co-insurance: ) Admin fee co-insurance:   Medical Benefit Details: Date Benefits were checked: 02/05/24 Deductible: no/ Coinsurance: 20%/ Admin Fee: 20%  Prior Auth: Approved PA# [id number] Expiration Date: 02/10/2024 to 12/01/2024  # of doses approved: ------------------------------------------------------------------------- Option# 2- Med Obtained from pharmacy  Pharmacy benefit: Copay $-- (Paid to pharmacy) Admin Fee: -- (Pay at clinic)  Prior Auth: -- PA# Expiration Date:   # of doses approved:  If patient wants fill through the pharmacy benefit please send prescription to:  -- , and include estimated need by date in rx notes. Pharmacy will ship medication directly to the office.  Patient not eligible for Evenity Copay Card. Copay Card can make patient's cost as little as $25. Link to apply: [email or web address]://www.[email or web address]  - This summary of benefits is an estimation of the patient's out-of-pocket cost. Exact cost may very based on individual plan coverage.

## 2024-02-18 NOTE — Telephone Encounter (Signed)
 Pt ready for scheduling for Prolia on or after :--  Option# 1: Buy/Bill (Office supplied medication)  Out-of-pocket cost due at time of clinic visit: $365  Number of injection/visits approved: -  Primary: [employer] - Medicare Prolia co-insurance: 20% Admin fee co-insurance: 20%  Secondary: N/A Prolia co-insurance:  Admin fee co-insurance:   Medical Benefit Details: Date Benefits were checked: 02/05/24 Deductible: no/ Coinsurance: 20%/ Admin Fee: 20%  Prior Auth: approved PA# [id number] Expiration Date: 02/10/24 to 12/01/24  # of doses approved: -- ----------------------------------------------------------------------- Option# 2- Med Obtained from pharmacy:  Pharmacy benefit: Copay $889.94 (Paid to pharmacy) Admin Fee: 20% approximately$25  (Pay at clinic)  Prior Auth: approved PA# [id number] Expiration Date: 02/10/24 to 12/01/24  # of doses approved:   If patient wants fill through the pharmacy benefit please send prescription to:  [facility] , and include estimated need by date in rx notes. Pharmacy will ship medication directly to the office.  Patient not eligible for Prolia Copay Card. Copay Card can make patient's cost as little as $25. Link to apply: [email or web address]://www.[email or web address]  ** This summary of benefits is an estimation of the patient's out-of-pocket cost. Exact cost may very based on individual plan coverage.

## 2024-02-19 ENCOUNTER — Telehealth: Payer: Self-pay | Admitting: Family Medicine

## 2024-02-19 NOTE — Telephone Encounter (Unsigned)
 Copied from CRM [id number]. Topic: Clinical - Medication Question >> Feb 19, 2024  1:31 PM Courtney Hood wrote: Reason for CRM: Patient keeps receiving messages about a medication in error that are not supposed to come to her. And she is requesting for the clinic to fix this issue

## 2024-02-19 NOTE — Telephone Encounter (Signed)
 I have to reached out to this pt on several different occasions and I have not gotten an answer. I also LVM

## 2024-02-20 NOTE — Telephone Encounter (Signed)
Unable to get in touch with pt.    

## 2024-03-30 DIAGNOSIS — L814 Other melanin hyperpigmentation: Secondary | ICD-10-CM | POA: Diagnosis not present

## 2024-03-30 DIAGNOSIS — D225 Melanocytic nevi of trunk: Secondary | ICD-10-CM | POA: Diagnosis not present

## 2024-03-30 DIAGNOSIS — L821 Other seborrheic keratosis: Secondary | ICD-10-CM | POA: Diagnosis not present

## 2024-04-05 DIAGNOSIS — Z136 Encounter for screening for cardiovascular disorders: Secondary | ICD-10-CM | POA: Diagnosis not present

## 2024-04-05 DIAGNOSIS — Z0000 Encounter for general adult medical examination without abnormal findings: Secondary | ICD-10-CM | POA: Diagnosis not present

## 2024-04-05 DIAGNOSIS — E213 Hyperparathyroidism, unspecified: Secondary | ICD-10-CM | POA: Diagnosis not present

## 2024-04-05 DIAGNOSIS — Z13220 Encounter for screening for lipoid disorders: Secondary | ICD-10-CM | POA: Diagnosis not present

## 2024-04-05 DIAGNOSIS — M810 Age-related osteoporosis without current pathological fracture: Secondary | ICD-10-CM | POA: Diagnosis not present

## 2024-04-05 DIAGNOSIS — Z23 Encounter for immunization: Secondary | ICD-10-CM | POA: Diagnosis not present

## 2024-04-14 ENCOUNTER — Telehealth: Payer: Self-pay

## 2024-04-14 NOTE — Telephone Encounter (Signed)
**Note De-identified  [place] Obfuscation** Please advise 

## 2024-04-14 NOTE — Telephone Encounter (Signed)
 Copied from CRM [id number]. Topic: General - Other >> Apr 14, 2024  9:48 AM Luane Rumps D wrote: Reason for CRM: Patient called to notify that she received a call from the clinic when she is no longer a patient of Dr. Alferd Igo, she is requesting to be taken off list of patients if possible.  Hey what do we need to do to have this pt removed from Dr. Jonelle Neri? I have advised her to contact her insurance company to have [facility] removed.  Please Advise

## 2024-04-14 NOTE — Telephone Encounter (Signed)
 Copied from CRM [id number]. Topic: General - Other >> Apr 14, 2024  9:48 AM Luane Rumps D wrote: Reason for CRM: Patient called to notify that she received a call from the clinic when she is no longer a patient of Dr. Alferd Igo, she is requesting to be taken off list of patients if possible.

## 2024-04-15 ENCOUNTER — Other Ambulatory Visit (HOSPITAL_BASED_OUTPATIENT_CLINIC_OR_DEPARTMENT_OTHER): Payer: Self-pay | Admitting: Family Medicine

## 2024-04-15 DIAGNOSIS — E785 Hyperlipidemia, unspecified: Secondary | ICD-10-CM

## 2024-04-15 NOTE — Telephone Encounter (Signed)
 PCP removed.

## 2024-04-19 DIAGNOSIS — M810 Age-related osteoporosis without current pathological fracture: Secondary | ICD-10-CM | POA: Diagnosis not present

## 2024-04-21 DIAGNOSIS — L648 Other androgenic alopecia: Secondary | ICD-10-CM | POA: Diagnosis not present

## 2024-04-21 DIAGNOSIS — L218 Other seborrheic dermatitis: Secondary | ICD-10-CM | POA: Diagnosis not present

## 2024-05-03 ENCOUNTER — Ambulatory Visit (HOSPITAL_BASED_OUTPATIENT_CLINIC_OR_DEPARTMENT_OTHER)
Admission: RE | Admit: 2024-05-03 | Discharge: 2024-05-03 | Disposition: A | Discharge status: Home / Self Care | Payer: Self-pay | Source: Ambulatory Visit | Attending: Family Medicine | Admitting: Family Medicine

## 2024-05-03 DIAGNOSIS — E785 Hyperlipidemia, unspecified: Secondary | ICD-10-CM | POA: Insufficient documentation

## 2024-06-03 DIAGNOSIS — R911 Solitary pulmonary nodule: Secondary | ICD-10-CM | POA: Diagnosis not present

## 2024-06-03 DIAGNOSIS — E7800 Pure hypercholesterolemia, unspecified: Secondary | ICD-10-CM | POA: Diagnosis not present

## 2024-06-03 DIAGNOSIS — M810 Age-related osteoporosis without current pathological fracture: Secondary | ICD-10-CM | POA: Diagnosis not present

## 2024-06-03 DIAGNOSIS — Z87891 Personal history of nicotine dependence: Secondary | ICD-10-CM | POA: Diagnosis not present

## 2024-06-14 ENCOUNTER — Ambulatory Visit: Admitting: Podiatry

## 2024-06-14 VITALS — Ht 67.0 in | Wt 121.0 lb

## 2024-06-14 DIAGNOSIS — B351 Tinea unguium: Secondary | ICD-10-CM | POA: Diagnosis not present

## 2024-06-14 DIAGNOSIS — Z79899 Other long term (current) drug therapy: Secondary | ICD-10-CM | POA: Diagnosis not present

## 2024-06-14 NOTE — Patient Instructions (Signed)

## 2024-06-15 LAB — CBC WITH DIFFERENTIAL/PLATELET
Basophils Absolute: 0 x10E3/uL (ref 0.0–0.2)
Basos: 1 %
EOS (ABSOLUTE): 0.1 x10E3/uL (ref 0.0–0.4)
Eos: 2 %
Hematocrit: 45.8 % (ref 34.0–46.6)
Hemoglobin: 15 g/dL (ref 11.1–15.9)
Immature Grans (Abs): 0 x10E3/uL (ref 0.0–0.1)
Immature Granulocytes: 0 %
Lymphocytes Absolute: 2 x10E3/uL (ref 0.7–3.1)
Lymphs: 35 %
MCH: 30.2 pg (ref 26.6–33.0)
MCHC: 32.8 g/dL (ref 31.5–35.7)
MCV: 92 fL (ref 79–97)
Monocytes Absolute: 0.4 x10E3/uL (ref 0.1–0.9)
Monocytes: 8 %
Neutrophils Absolute: 3.1 x10E3/uL (ref 1.4–7.0)
Neutrophils: 54 %
Platelets: 245 x10E3/uL (ref 150–450)
RBC: 4.96 x10E6/uL (ref 3.77–5.28)
RDW: 12.5 % (ref 11.7–15.4)
WBC: 5.8 x10E3/uL (ref 3.4–10.8)

## 2024-06-15 LAB — HEPATIC FUNCTION PANEL
ALT: 7 IU/L (ref 0–32)
AST: 21 IU/L (ref 0–40)
Albumin: 4.3 g/dL (ref 3.8–4.8)
Alkaline Phosphatase: 78 IU/L (ref 44–121)
Bilirubin Total: 0.3 mg/dL (ref 0.0–1.2)
Bilirubin, Direct: 0.11 mg/dL (ref 0.00–0.40)
Total Protein: 6.5 g/dL (ref 6.0–8.5)

## 2024-06-16 NOTE — Progress Notes (Signed)
  Subjective:  Patient ID: Courtney Hood, female    DOB: [birth year],  MRN: [id number]  Chief Complaint  Patient presents with   Nail Problem    RM 13 Patient is here for bilateral onychomycosis and pain of the greater toes. Patient is not taking anything at home to treat the fungus and pain.    Discussed the use of AI scribe software for clinical note transcription with the patient, who gave verbal consent to proceed.  History of Present Illness Courtney Hood is a 78 year old female who presents with thickened toenails and nail discoloration.  She has had nail fungus on both big toes for a long time, previously managing it by allowing the discolored nails to grow out. About six months ago, the nails became thicker, making them difficult to cut.  During a recent vacation, she wore new tennis shoes that were possibly too small, leading to her toenails being pushed back, resulting in redness and bruising around the nails.  This is since resolved.  The nails are curling inwards and were ingrown many years ago.       Objective:    Physical Exam General: AAO x3, NAD  Dermatological: Nails remain hypertrophic, dystrophic with, brown discoloration.  There is malaise edema on the proximal nail fold with is no erythema or warmth.  There is no drainage or pus noted today.  No signs of infection otherwise.  No pain to the nails.  Vascular: Dorsalis Pedis artery and Posterior Tibial artery pedal pulses are 2/4 bilateral with immedate capillary fill time. There is no pain with calf compression, swelling, warmth, erythema.   Neruologic: Grossly intact via light touch bilateral.   Musculoskeletal: No tenderness on exam today.  Gait: Unassisted, Nonantalgic.     No images are attached to the encounter.    Results     Assessment:   1. Long-term use of high-risk medication   2. Onychomycosis      Plan:  Patient was evaluated and treated and all questions  answered.  Assessment and Plan Assessment & Plan Onychomycosis Chronic onychomycosis with exacerbation. Nails thickened, bruised, with fluid drainage post-trauma. Differential includes fungal infection. - Order liver function tests before starting oral antifungal therapy. - Prescribe Lamisil (terbinafine) 250 mg daily for three months, pending normal liver function tests. - Recheck liver function tests at six weeks. - Schedule follow-up in three months to assess progress. - Educated on Lamisil side effects: rash, upset stomach, body aches, metallic taste, liver issues. - Advised on toenail care: cut straight across, regular trimming or pedicures.   Return in about 3 months (around 09/14/2024) for nail fungus, lamisil check .   Donnice JONELLE Fees DPM

## 2024-06-18 ENCOUNTER — Other Ambulatory Visit: Payer: Self-pay | Admitting: Podiatry

## 2024-06-18 ENCOUNTER — Ambulatory Visit: Payer: Self-pay | Admitting: Podiatry

## 2024-06-18 DIAGNOSIS — Z79899 Other long term (current) drug therapy: Secondary | ICD-10-CM

## 2024-06-18 MED ORDER — TERBINAFINE HCL 250 MG PO TABS: 250.0000 mg | ORAL_TABLET | Freq: Every day | ORAL | 0 refills | Status: AC

## 2024-07-22 DIAGNOSIS — L6611 Classic lichen planopilaris: Secondary | ICD-10-CM | POA: Diagnosis not present

## 2024-07-22 DIAGNOSIS — L821 Other seborrheic keratosis: Secondary | ICD-10-CM | POA: Diagnosis not present

## 2024-07-22 DIAGNOSIS — L648 Other androgenic alopecia: Secondary | ICD-10-CM | POA: Diagnosis not present

## 2024-08-03 ENCOUNTER — Other Ambulatory Visit: Payer: Self-pay | Admitting: Family Medicine

## 2024-08-03 ENCOUNTER — Encounter: Payer: Self-pay | Admitting: Family Medicine

## 2024-08-03 DIAGNOSIS — R911 Solitary pulmonary nodule: Secondary | ICD-10-CM

## 2024-08-11 ENCOUNTER — Other Ambulatory Visit

## 2024-08-27 ENCOUNTER — Ambulatory Visit (HOSPITAL_BASED_OUTPATIENT_CLINIC_OR_DEPARTMENT_OTHER)

## 2024-08-30 ENCOUNTER — Telehealth: Payer: Self-pay | Admitting: Podiatry

## 2024-08-30 NOTE — Telephone Encounter (Signed)
 Patient LVM 08/27/24 @ 2:58pm stating she was supposed to have lab work done 6 weeks after starting the medication prescribed during her visit on 06/14/24. Please notify the patient once the lab order has been placed. Thank you.

## 2024-09-03 DIAGNOSIS — E7800 Pure hypercholesterolemia, unspecified: Secondary | ICD-10-CM | POA: Diagnosis not present

## 2024-09-07 ENCOUNTER — Other Ambulatory Visit: Payer: Self-pay | Admitting: Podiatry

## 2024-09-07 ENCOUNTER — Ambulatory Visit: Payer: Self-pay | Admitting: Podiatry

## 2024-09-14 ENCOUNTER — Ambulatory Visit: Admitting: Podiatry

## 2024-09-14 DIAGNOSIS — Z1231 Encounter for screening mammogram for malignant neoplasm of breast: Secondary | ICD-10-CM | POA: Diagnosis not present

## 2024-09-30 ENCOUNTER — Other Ambulatory Visit: Payer: Self-pay | Admitting: Podiatry

## 2024-09-30 DIAGNOSIS — Z79899 Other long term (current) drug therapy: Secondary | ICD-10-CM

## 2024-10-05 ENCOUNTER — Ambulatory Visit: Admitting: Podiatry

## 2024-10-06 ENCOUNTER — Ambulatory Visit (HOSPITAL_BASED_OUTPATIENT_CLINIC_OR_DEPARTMENT_OTHER)
Admission: RE | Admit: 2024-10-06 | Discharge: 2024-10-06 | Disposition: A | Discharge status: Home / Self Care | Source: Ambulatory Visit | Attending: Family Medicine | Admitting: Family Medicine

## 2024-10-06 DIAGNOSIS — M542 Cervicalgia: Secondary | ICD-10-CM | POA: Diagnosis not present

## 2024-10-06 DIAGNOSIS — R911 Solitary pulmonary nodule: Secondary | ICD-10-CM | POA: Insufficient documentation

## 2024-10-06 DIAGNOSIS — R918 Other nonspecific abnormal finding of lung field: Secondary | ICD-10-CM | POA: Diagnosis not present

## 2024-10-06 DIAGNOSIS — J432 Centrilobular emphysema: Secondary | ICD-10-CM | POA: Diagnosis not present

## 2024-10-14 DIAGNOSIS — Z79899 Other long term (current) drug therapy: Secondary | ICD-10-CM | POA: Diagnosis not present

## 2024-10-15 LAB — CBC WITH DIFFERENTIAL/PLATELET
Basophils Absolute: 0 x10E3/uL (ref 0.0–0.2)
Basos: 1 %
EOS (ABSOLUTE): 0.2 x10E3/uL (ref 0.0–0.4)
Eos: 3 %
Hematocrit: 43.7 % (ref 34.0–46.6)
Hemoglobin: 14.5 g/dL (ref 11.1–15.9)
Immature Grans (Abs): 0 x10E3/uL (ref 0.0–0.1)
Immature Granulocytes: 0 %
Lymphocytes Absolute: 1.9 x10E3/uL (ref 0.7–3.1)
Lymphs: 32 %
MCH: 29.9 pg (ref 26.6–33.0)
MCHC: 33.2 g/dL (ref 31.5–35.7)
MCV: 90 fL (ref 79–97)
Monocytes Absolute: 0.4 x10E3/uL (ref 0.1–0.9)
Monocytes: 7 %
Neutrophils Absolute: 3.4 x10E3/uL (ref 1.4–7.0)
Neutrophils: 57 %
Platelets: 214 x10E3/uL (ref 150–450)
RBC: 4.85 x10E6/uL (ref 3.77–5.28)
RDW: 12.3 % (ref 11.7–15.4)
WBC: 5.9 x10E3/uL (ref 3.4–10.8)

## 2024-10-15 LAB — HEPATIC FUNCTION PANEL
ALT: 5 IU/L (ref 0–32)
AST: 21 IU/L (ref 0–40)
Albumin: 4.7 g/dL (ref 3.8–4.8)
Alkaline Phosphatase: 68 IU/L (ref 49–135)
Bilirubin Total: 0.3 mg/dL (ref 0.0–1.2)
Bilirubin, Direct: 0.12 mg/dL (ref 0.00–0.40)
Total Protein: 6.9 g/dL (ref 6.0–8.5)

## 2024-10-18 ENCOUNTER — Ambulatory Visit: Payer: Self-pay | Admitting: Podiatry

## 2024-10-18 ENCOUNTER — Other Ambulatory Visit: Payer: Self-pay | Admitting: Podiatry

## 2024-10-18 MED ORDER — TERBINAFINE HCL 250 MG PO TABS
250.0000 mg | ORAL_TABLET | Freq: Every day | ORAL | 0 refills | Status: AC
Start: 2024-10-18 — End: ?

## 2024-11-04 DIAGNOSIS — M542 Cervicalgia: Secondary | ICD-10-CM | POA: Diagnosis not present

## 2024-11-04 DIAGNOSIS — M62830 Muscle spasm of back: Secondary | ICD-10-CM | POA: Diagnosis not present

## 2024-11-04 DIAGNOSIS — M25511 Pain in right shoulder: Secondary | ICD-10-CM | POA: Diagnosis not present

## 2024-11-04 DIAGNOSIS — M7912 Myalgia of auxiliary muscles, head and neck: Secondary | ICD-10-CM | POA: Diagnosis not present

## 2024-11-04 DIAGNOSIS — M5030 Other cervical disc degeneration, unspecified cervical region: Secondary | ICD-10-CM | POA: Diagnosis not present

## 2024-12-03 ENCOUNTER — Other Ambulatory Visit: Payer: Self-pay

## 2024-12-03 ENCOUNTER — Encounter (HOSPITAL_BASED_OUTPATIENT_CLINIC_OR_DEPARTMENT_OTHER): Payer: Self-pay

## 2024-12-03 ENCOUNTER — Emergency Department (HOSPITAL_BASED_OUTPATIENT_CLINIC_OR_DEPARTMENT_OTHER)
Admission: EM | Admit: 2024-12-03 | Discharge: 2024-12-03 | Disposition: A | Discharge status: Home / Self Care | Attending: Emergency Medicine | Admitting: Emergency Medicine

## 2024-12-03 ENCOUNTER — Emergency Department (HOSPITAL_BASED_OUTPATIENT_CLINIC_OR_DEPARTMENT_OTHER): Admitting: Radiology

## 2024-12-03 DIAGNOSIS — R000 Tachycardia, unspecified: Secondary | ICD-10-CM | POA: Insufficient documentation

## 2024-12-03 DIAGNOSIS — B349 Viral infection, unspecified: Secondary | ICD-10-CM | POA: Insufficient documentation

## 2024-12-03 DIAGNOSIS — Z79899 Other long term (current) drug therapy: Secondary | ICD-10-CM | POA: Diagnosis not present

## 2024-12-03 DIAGNOSIS — R051 Acute cough: Secondary | ICD-10-CM

## 2024-12-03 LAB — BASIC METABOLIC PANEL WITH GFR
Anion gap: 17 — ABNORMAL HIGH (ref 5–15)
BUN: 29 mg/dL — ABNORMAL HIGH (ref 8–23)
CO2: 22 mmol/L (ref 22–32)
Calcium: 11.6 mg/dL — ABNORMAL HIGH (ref 8.9–10.3)
Chloride: 102 mmol/L (ref 98–111)
Creatinine, Ser: 0.98 mg/dL (ref 0.44–1.00)
GFR, Estimated: 60 mL/min — ABNORMAL LOW
Glucose, Bld: 95 mg/dL (ref 70–99)
Potassium: 4 mmol/L (ref 3.5–5.1)
Sodium: 140 mmol/L (ref 135–145)

## 2024-12-03 LAB — CBC WITH DIFFERENTIAL/PLATELET
Abs Immature Granulocytes: 0.04 K/uL (ref 0.00–0.07)
Basophils Absolute: 0 K/uL (ref 0.0–0.1)
Basophils Relative: 0 %
Eosinophils Absolute: 0 K/uL (ref 0.0–0.5)
Eosinophils Relative: 0 %
HCT: 51 % — ABNORMAL HIGH (ref 36.0–46.0)
Hemoglobin: 17 g/dL — ABNORMAL HIGH (ref 12.0–15.0)
Immature Granulocytes: 0 %
Lymphocytes Relative: 25 %
Lymphs Abs: 2.5 K/uL (ref 0.7–4.0)
MCH: 30.1 pg (ref 26.0–34.0)
MCHC: 33.3 g/dL (ref 30.0–36.0)
MCV: 90.3 fL (ref 80.0–100.0)
Monocytes Absolute: 0.8 K/uL (ref 0.1–1.0)
Monocytes Relative: 8 %
Neutro Abs: 6.8 K/uL (ref 1.7–7.7)
Neutrophils Relative %: 67 %
Platelets: 201 K/uL (ref 150–400)
RBC: 5.65 MIL/uL — ABNORMAL HIGH (ref 3.87–5.11)
RDW: 13.1 % (ref 11.5–15.5)
WBC: 10.3 K/uL (ref 4.0–10.5)
nRBC: 0 % (ref 0.0–0.2)

## 2024-12-03 LAB — RESP PANEL BY RT-PCR (RSV, FLU A&B, COVID)  RVPGX2
Influenza A by PCR: NEGATIVE
Influenza B by PCR: NEGATIVE
Resp Syncytial Virus by PCR: NEGATIVE
SARS Coronavirus 2 by RT PCR: NEGATIVE

## 2024-12-03 LAB — D-DIMER, QUANTITATIVE: D-Dimer, Quant: 0.61 ug{FEU}/mL — ABNORMAL HIGH (ref 0.00–0.50)

## 2024-12-03 LAB — TROPONIN T, HIGH SENSITIVITY: Troponin T High Sensitivity: <15 ng/L (ref 0–19)

## 2024-12-03 MED ORDER — SODIUM CHLORIDE 0.9 % IV BOLUS
500.0000 mL | Freq: Once | INTRAVENOUS | Status: AC
  Administered 2024-12-03: 500 mL via INTRAVENOUS

## 2024-12-03 MED ORDER — DEXAMETHASONE 4 MG PO TABS
6.0000 mg | ORAL_TABLET | Freq: Once | ORAL | Status: AC
  Administered 2024-12-03: 6 mg via ORAL
  Filled 2024-12-03: qty 2

## 2024-12-03 MED ORDER — GUAIFENESIN-DM 100-10 MG/5ML PO SYRP: 5.0000 mL | ORAL_SOLUTION | Freq: Three times a day (TID) | ORAL | 0 refills | Status: AC | PRN

## 2024-12-03 MED ORDER — ALBUTEROL SULFATE HFA 108 (90 BASE) MCG/ACT IN AERS
2.0000 | INHALATION_SPRAY | RESPIRATORY_TRACT | Status: DC | PRN
  Administered 2024-12-03: 2 via RESPIRATORY_TRACT
  Filled 2024-12-03: qty 6.7

## 2024-12-03 MED ORDER — IPRATROPIUM-ALBUTEROL 0.5-2.5 (3) MG/3ML IN SOLN
3.0000 mL | Freq: Once | RESPIRATORY_TRACT | Status: AC
  Administered 2024-12-03: 3 mL via RESPIRATORY_TRACT
  Filled 2024-12-03: qty 3

## 2024-12-03 NOTE — ED Notes (Signed)
 Reviewed discharge instructions, medications, and home care with pt. Pt verbalized understanding and had no further questions. Pt exited ED without complications.

## 2024-12-03 NOTE — ED Triage Notes (Signed)
 Complaining of a cough that will not let her lay down at night. Started 5 days ago.

## 2024-12-03 NOTE — ED Notes (Signed)
 PT ambulated half the length of the ED hallway. Notably SOB when returned to the bed. PT was 92% o2 and above when ambulating but dropped to 85% when returned to the bed. Rose to 90% on room air.

## 2024-12-03 NOTE — Discharge Instructions (Addendum)
 Watch for worsening difficulty breathing.  Take the inhaler to help as needed.  The steroids also may help with the trouble breathing.

## 2024-12-03 NOTE — ED Provider Notes (Signed)
 " [facility] AT [facility] Provider Note   CSN: [id number] Arrival date & time: 12/03/24  1312     Patient presents with: Cough   Courtney Hood is a 79 y.o. female.    Cough Patient has had a cough over the last week.  More short of breath.  States some difficulty with laying flat.  Some chills.  No frank fevers.  Denies lung problems at baseline.    Past Medical History:  Diagnosis Date   Depression    Hyperlipidemia    Osteopenia of femoral neck, bilateral    Primary hyperparathyroidism     Prior to Admission medications  Medication Sig Start Date End Date Taking? Authorizing Provider  guaiFENesin -dextromethorphan (ROBITUSSIN DM) 100-10 MG/5ML syrup Take 5 mLs by mouth 3 (three) times daily as needed for cough. 12/03/24  Yes Patsey Lot, MD  terbinafine  (LAMISIL ) 250 MG tablet Take 1 tablet (250 mg total) by mouth daily. 06/18/24   Gershon Donnice SAUNDERS, DPM  terbinafine  (LAMISIL ) 250 MG tablet Take 1 tablet (250 mg total) by mouth daily. 10/18/24   Gershon Donnice SAUNDERS, DPM  buPROPion  (WELLBUTRIN  XL) 150 MG 24 hr tablet TAKE 1 TABLET BY MOUTH EVERY DAY 10/07/23   Jodie Lavern CROME, MD  neomycin -polymyxin-hydrocortisone (CORTISPORIN) OTIC solution Apply  3 drops to affected ear 4 times daily for 7 days. 12/11/23   Gladis Elsie BROCKS, PA-C  sertraline  (ZOLOFT ) 100 MG tablet TAKE 2 TABLETS BY MOUTH EVERY DAY 10/20/23   Jodie Lavern CROME, MD    Allergies: [place] [risedronate sodium]    Review of Systems  Respiratory:  Positive for cough.     Updated Vital Signs BP (!) 149/68   Pulse 93   Temp 98.2 F (36.8 C) (Oral)   Resp 16   Ht 5' 7 (1.702 m)   Wt 54.9 kg   SpO2 93%   BMI 18.95 kg/m   Physical Exam Vitals and nursing note reviewed.  Cardiovascular:     Rate and Rhythm: Regular rhythm. Tachycardia present.  Pulmonary:     Breath sounds: No wheezing or rhonchi.  Abdominal:     Tenderness: There is no abdominal tenderness.   Musculoskeletal:     Right lower leg: No edema.     Left lower leg: No edema.  Skin:    General: Skin is warm.  Neurological:     Mental Status: She is alert and oriented to person, place, and time.     (all labs ordered are listed, but only abnormal results are displayed) Labs Reviewed  BASIC METABOLIC PANEL WITH GFR - Abnormal; Notable for the following components:      Result Value   BUN 29 (*)    Calcium  11.6 (*)    GFR, Estimated 60 (*)    Anion gap 17 (*)    All other components within normal limits  CBC WITH DIFFERENTIAL/PLATELET - Abnormal; Notable for the following components:   RBC 5.65 (*)    Hemoglobin 17.0 (*)    HCT 51.0 (*)    All other components within normal limits  D-DIMER, QUANTITATIVE - Abnormal; Notable for the following components:   D-Dimer, Quant 0.61 (*)    All other components within normal limits  RESP PANEL BY RT-PCR (RSV, FLU A&B, COVID)  RVPGX2  TROPONIN T, HIGH SENSITIVITY    EKG: None  Radiology: DG Chest 2 View Result Date: 12/03/2024 CLINICAL DATA:  Cough. EXAM: CHEST - 2 VIEW COMPARISON:  CT 10/06/2024 FINDINGS: The cardiomediastinal  contours are normal. Aortic atherosclerosis. Pulmonary vasculature is normal. No consolidation, pleural effusion, or pneumothorax. No acute osseous abnormalities are seen. IMPRESSION: No active cardiopulmonary disease. Electronically Signed   By: Andrea Gasman M.D.   On: 12/03/2024 15:47     Procedures   Medications Ordered in the ED  albuterol (VENTOLIN HFA) 108 (90 Base) MCG/ACT inhaler 2 puff (2 puffs Inhalation Given 12/03/24 2222)  ipratropium-albuterol (DUONEB) 0.5-2.5 (3) MG/3ML nebulizer solution 3 mL (3 mLs Nebulization Given 12/03/24 1928)  sodium chloride  0.9 % bolus 500 mL (0 mLs Intravenous Stopped 12/03/24 2136)  dexamethasone  (DECADRON ) tablet 6 mg (6 mg Oral Given 12/03/24 2244)                                    Medical Decision Making Amount and/or Complexity of Data Reviewed Labs:  ordered. Radiology: ordered.  Risk OTC drugs. Prescription drug management.   Patient with shortness of breath and cough.  Is had for the last week.  Differential diagnosis includes pneumonia, viral syndrome, influenza.  Also PE considered.  Will get basic blood work x-ray and D-dimer.  D-dimer is age-adjusted negative.  Patient was ambulated however and did have sats went to about 85%.  Will give breathing treatment.  Breathing treatment made patient feels somewhat better.  Tachycardia is resolved.  D-dimer negative.  Did have slight hypoxia with ambulation sat 1-88 but patient felt fine.  Not severe tachycardia.  Discussed with patient and will discharge home with more symptomatic treatment.  Will give dose of steroids here and inhaler for home.  Will return for worsening symptoms.     Final diagnoses:  Viral syndrome  Acute cough    ED Discharge Orders          Ordered    guaiFENesin -dextromethorphan (ROBITUSSIN DM) 100-10 MG/5ML syrup  3 times daily PRN        12/03/24 2215               Patsey Lot, MD 12/03/24 2348  "

## 2024-12-03 NOTE — ED Notes (Signed)
 ED Provider at bedside.

## 2024-12-03 NOTE — ED Notes (Signed)
 Pt placed on 2L of oxygen via [place] due to SOB and O2 sat in low 90s on Room air. EDP made aware.

## 2024-12-03 NOTE — ED Notes (Signed)
 Walked around unit on room air. SpO2 dipped to 88 with HR 128 but was quick to rebound back to 92% upon stopping. Tolerated well. NAD. Patient back to room with room air SpO2 94% HR 108 RR 18 Strong, congested cough. No complaints at this time.
